# Patient Record
Sex: Female | Born: 1935
Health system: Southern US, Community
[De-identification: ages and names within clinical notes are randomized; demographics above are authoritative.]

## PROBLEM LIST (undated history)

## (undated) DIAGNOSIS — S42309A Unspecified fracture of shaft of humerus, unspecified arm, initial encounter for closed fracture: Secondary | ICD-10-CM

## (undated) DIAGNOSIS — I442 Atrioventricular block, complete: Secondary | ICD-10-CM

## (undated) DIAGNOSIS — I10 Essential (primary) hypertension: Secondary | ICD-10-CM

## (undated) DIAGNOSIS — S8290XA Unspecified fracture of unspecified lower leg, initial encounter for closed fracture: Secondary | ICD-10-CM

## (undated) DIAGNOSIS — F419 Anxiety disorder, unspecified: Secondary | ICD-10-CM

## (undated) DIAGNOSIS — R42 Dizziness and giddiness: Secondary | ICD-10-CM

## (undated) HISTORY — DX: Unspecified fracture of unspecified lower leg, initial encounter for closed fracture: S82.90XA

## (undated) HISTORY — DX: Atrioventricular block, complete: I44.2

## (undated) HISTORY — DX: Anxiety disorder, unspecified: F41.9

## (undated) HISTORY — DX: Dizziness and giddiness: R42

---

## 2013-06-10 ENCOUNTER — Encounter (HOSPITAL_COMMUNITY): Payer: Self-pay | Admitting: Emergency Medicine

## 2013-06-10 ENCOUNTER — Emergency Department (HOSPITAL_COMMUNITY)
Admission: EM | Admit: 2013-06-10 | Discharge: 2013-06-10 | Disposition: A | Discharge status: Home / Self Care | Payer: Medicare Other | Attending: Emergency Medicine | Admitting: Emergency Medicine

## 2013-06-10 ENCOUNTER — Emergency Department (HOSPITAL_COMMUNITY): Payer: Medicare Other

## 2013-06-10 DIAGNOSIS — Z880 Allergy status to penicillin: Secondary | ICD-10-CM | POA: Insufficient documentation

## 2013-06-10 DIAGNOSIS — W19XXXA Unspecified fall, initial encounter: Secondary | ICD-10-CM

## 2013-06-10 DIAGNOSIS — S93409A Sprain of unspecified ligament of unspecified ankle, initial encounter: Secondary | ICD-10-CM | POA: Insufficient documentation

## 2013-06-10 DIAGNOSIS — S39012A Strain of muscle, fascia and tendon of lower back, initial encounter: Secondary | ICD-10-CM

## 2013-06-10 DIAGNOSIS — S335XXA Sprain of ligaments of lumbar spine, initial encounter: Secondary | ICD-10-CM | POA: Insufficient documentation

## 2013-06-10 DIAGNOSIS — W010XXA Fall on same level from slipping, tripping and stumbling without subsequent striking against object, initial encounter: Secondary | ICD-10-CM | POA: Insufficient documentation

## 2013-06-10 DIAGNOSIS — Y9389 Activity, other specified: Secondary | ICD-10-CM | POA: Insufficient documentation

## 2013-06-10 DIAGNOSIS — Y9229 Other specified public building as the place of occurrence of the external cause: Secondary | ICD-10-CM | POA: Insufficient documentation

## 2013-06-10 DIAGNOSIS — S93401A Sprain of unspecified ligament of right ankle, initial encounter: Secondary | ICD-10-CM

## 2013-06-10 DIAGNOSIS — Z87891 Personal history of nicotine dependence: Secondary | ICD-10-CM | POA: Insufficient documentation

## 2013-06-10 MED ORDER — ACETAMINOPHEN 325 MG PO TABS
650.0000 mg | ORAL_TABLET | Freq: Once | ORAL | Status: AC
  Administered 2013-06-10: 650 mg via ORAL
  Filled 2013-06-10: qty 2

## 2013-06-10 NOTE — ED Provider Notes (Signed)
CSN: [id number]     Arrival date & time 06/10/13  1237 History  This chart was scribed for non-physician practitioner, Jaynie Crumble, PA-C,working with Celene Kras, MD, by Karle Plumber, ED Scribe.  This patient was seen in room WA03/WA03 and the patient's care was started at 1:12 PM.    Chief Complaint  Patient presents with  . Fall  . Back Pain  . Ankle Pain   The history is provided by the patient. No language interpreter was used.   HPI Comments:  [name] [name] is a 77 y.o. female who presents to the Emergency Department complaining of right ankle pain and lower back pain after tripping and falling here at the hospital while visiting husband onset approximately 10 minutes PTA. She states she fell to the side and complains of associated right hand pain due to trying to catch herself. Pt denies current pain in her ankle. She is very dismissive of any pain. Pt denies dizziness, LOC, or head injury.  History reviewed. No pertinent past medical history. History reviewed. No pertinent past surgical history. No family history on file. History  Substance Use Topics  . Smoking status: Former Smoker    Quit date: 09/06/1986  . Smokeless tobacco: Not on file  . Alcohol Use: Yes     Comment: occasionally   OB History   Grav Para Term Preterm Abortions TAB SAB Ect Mult Living                 Review of Systems  Musculoskeletal: Positive for back pain and arthralgias (right ankle pain).  All other systems reviewed and are negative.   Allergies  Penicillins  Home Medications  No current outpatient prescriptions on file. Triage Vitals: BP 176/71  Pulse 81  Temp(Src) 98 F (36.7 C) (Oral)  Resp 16  SpO2 96% Physical Exam  Nursing note and vitals reviewed. Constitutional: She is oriented to person, place, and time. She appears well-developed and well-nourished. No distress.  HENT:  Head: Normocephalic and atraumatic.  Eyes: Conjunctivae are normal. No scleral icterus.   Neck: Normal range of motion. Neck supple.  Cardiovascular: Normal rate and intact distal pulses.   Pulmonary/Chest: Effort normal. No stridor. No respiratory distress.  Abdominal: Normal appearance. She exhibits no distension.  Musculoskeletal:  Swelling to right lateral malleolus. Pain with plantar and dorsal flexion of the right ankle and with internal rotation. Normal foot. Dorsal pedal pulses intact. Spasms to left para-lumbar. No midline lumbar spine tenderness.   Neurological: She is alert and oriented to person, place, and time.  Skin: Skin is warm and dry. No rash noted.  Psychiatric: She has a normal mood and affect. Her behavior is normal.    ED Course  Procedures (including critical care time) DIAGNOSTIC STUDIES: Oxygen Saturation is 96% on RA, adequate by my interpretation.   COORDINATION OF CARE: 1:20 PM- Will obtain X-Rays of right ankle and give Tylenol for pain. Pt verbalizes understanding and agrees to plan.  Medications - No data to display  Labs Review Labs Reviewed - No data to display  Imaging Review Dg Lumbar Spine Complete  06/10/2013   CLINICAL DATA:  Fall with low back pain.  EXAM: LUMBAR SPINE - COMPLETE 4+ VIEW  COMPARISON:  None.  FINDINGS: Bones are osteopenic. No acute fractures identified. Mild degenerative changes are identified consisting primarily of facet hypertrophy at the L4-5 and L5-S1 levels. No bony lesions are identified.  IMPRESSION: No acute fracture.  Mild degenerative changes of the lumbar spine.  Electronically Signed   By: Irish Lack M.D.   On: 06/10/2013 13:45   Dg Ankle Complete Right  06/10/2013   CLINICAL DATA:  Fall with right ankle injury and pain.  EXAM: RIGHT ANKLE - COMPLETE 3+ VIEW  COMPARISON:  None.  FINDINGS: Soft tissue swelling is present without evidence of acute fracture or dislocation. Degenerative changes are seen involving the medial malleolus. No bony lesions or destruction are identified.  IMPRESSION: Soft  tissue swelling without evidence of acute fracture.   Electronically Signed   By: Irish Lack M.D.   On: 06/10/2013 13:48    MDM   1. Ankle sprain, right, initial encounter   2. Fall, initial encounter   3. Lumbar strain, initial encounter     PT with mechanical fall. Injury to right ankle and back. Pt is ambulatory, no distress. No head injury. She is ambulatory. X-rays of ankle and lumbar spine obtained and are negative. ASO provided. Home with close follow up. Tylenol for pain.   Filed Vitals:   06/10/13 1252 06/10/13 1303  BP: 176/71 187/63  Pulse: 81   Temp: 98 F (36.7 C)   TempSrc: Oral   Resp: 16   SpO2: 96%     I personally performed the services described in this documentation, which was scribed in my presence. The recorded information has been reviewed and is accurate.   Lottie Mussel, PA-C 06/10/13 1908

## 2013-06-10 NOTE — ED Notes (Signed)
Pt reports that she tripped and fell this approx 10 mins PTA while upstairs visiting her husband. Pt denies hitting her head. Pt c/o lower back pain and R ankle pain. Pt is A&O and in NAD.

## 2013-06-12 NOTE — ED Provider Notes (Signed)
Medical screening examination/treatment/procedure(s) were conducted as a shared visit with non-physician practitioner(s) and myself.  I personally evaluated the patient during the encounter  Pt with mild complaints.  Negative xrays.  DC with outpatient follow up as needed.   Ankle splint for support  Celene Kras, MD 06/12/13 [phone]

## 2014-04-22 ENCOUNTER — Other Ambulatory Visit (HOSPITAL_COMMUNITY): Payer: Self-pay | Admitting: Orthopaedic Surgery

## 2014-04-22 ENCOUNTER — Ambulatory Visit (HOSPITAL_COMMUNITY)
Admission: RE | Admit: 2014-04-22 | Discharge: 2014-04-22 | Disposition: A | Discharge status: Home / Self Care | Payer: Medicare Other | Source: Ambulatory Visit | Attending: Orthopaedic Surgery | Admitting: Orthopaedic Surgery

## 2014-04-22 ENCOUNTER — Other Ambulatory Visit: Payer: Self-pay | Admitting: Orthopaedic Surgery

## 2014-04-22 DIAGNOSIS — W19XXXA Unspecified fall, initial encounter: Secondary | ICD-10-CM | POA: Diagnosis not present

## 2014-04-22 DIAGNOSIS — S42209A Unspecified fracture of upper end of unspecified humerus, initial encounter for closed fracture: Secondary | ICD-10-CM | POA: Diagnosis present

## 2014-04-22 DIAGNOSIS — T148XXA Other injury of unspecified body region, initial encounter: Secondary | ICD-10-CM

## 2014-04-22 DIAGNOSIS — E041 Nontoxic single thyroid nodule: Secondary | ICD-10-CM | POA: Insufficient documentation

## 2014-04-22 DIAGNOSIS — M25511 Pain in right shoulder: Secondary | ICD-10-CM

## 2014-09-02 ENCOUNTER — Emergency Department (HOSPITAL_COMMUNITY): Payer: PRIVATE HEALTH INSURANCE

## 2014-09-02 ENCOUNTER — Emergency Department (HOSPITAL_COMMUNITY)
Admission: EM | Admit: 2014-09-02 | Discharge: 2014-09-02 | Disposition: A | Discharge status: Home / Self Care | Payer: PRIVATE HEALTH INSURANCE | Attending: Emergency Medicine | Admitting: Emergency Medicine

## 2014-09-02 ENCOUNTER — Encounter (HOSPITAL_COMMUNITY): Payer: Self-pay | Admitting: *Deleted

## 2014-09-02 DIAGNOSIS — Z87891 Personal history of nicotine dependence: Secondary | ICD-10-CM | POA: Diagnosis not present

## 2014-09-02 DIAGNOSIS — R42 Dizziness and giddiness: Secondary | ICD-10-CM | POA: Diagnosis present

## 2014-09-02 DIAGNOSIS — Z7982 Long term (current) use of aspirin: Secondary | ICD-10-CM | POA: Insufficient documentation

## 2014-09-02 DIAGNOSIS — Z880 Allergy status to penicillin: Secondary | ICD-10-CM | POA: Diagnosis not present

## 2014-09-02 DIAGNOSIS — Z792 Long term (current) use of antibiotics: Secondary | ICD-10-CM | POA: Insufficient documentation

## 2014-09-02 DIAGNOSIS — I10 Essential (primary) hypertension: Secondary | ICD-10-CM | POA: Diagnosis not present

## 2014-09-02 DIAGNOSIS — Z79899 Other long term (current) drug therapy: Secondary | ICD-10-CM | POA: Insufficient documentation

## 2014-09-02 HISTORY — DX: Essential (primary) hypertension: I10

## 2014-09-02 LAB — BASIC METABOLIC PANEL
Anion gap: 9 (ref 5–15)
BUN: 11 mg/dL (ref 6–23)
CHLORIDE: 105 meq/L (ref 96–112)
CO2: 23 mmol/L (ref 19–32)
Calcium: 9.7 mg/dL (ref 8.4–10.5)
Creatinine, Ser: 0.66 mg/dL (ref 0.50–1.10)
GFR calc Af Amer: >90 mL/min (ref >90)
GFR calc non Af Amer: 84 mL/min — ABNORMAL LOW (ref >90)
GLUCOSE: 103 mg/dL — AB (ref 70–99)
POTASSIUM: 3.7 mmol/L (ref 3.5–5.1)
Sodium: 137 mmol/L (ref 135–145)

## 2014-09-02 LAB — URINALYSIS, ROUTINE W REFLEX MICROSCOPIC
BILIRUBIN URINE: NEGATIVE
GLUCOSE, UA: NEGATIVE mg/dL
Hgb urine dipstick: NEGATIVE
KETONES UR: NEGATIVE mg/dL
Nitrite: NEGATIVE
Protein, ur: NEGATIVE mg/dL
SPECIFIC GRAVITY, URINE: 1.004 — AB (ref 1.005–1.030)
Urobilinogen, UA: 0.2 mg/dL (ref 0.0–1.0)
pH: 6.5 (ref 5.0–8.0)

## 2014-09-02 LAB — CBC
HCT: 46 % (ref 36.0–46.0)
HEMOGLOBIN: 15 g/dL (ref 12.0–15.0)
MCH: 29.3 pg (ref 26.0–34.0)
MCHC: 32.6 g/dL (ref 30.0–36.0)
MCV: 89.8 fL (ref 78.0–100.0)
Platelets: 272 10*3/uL (ref 150–400)
RBC: 5.12 MIL/uL — AB (ref 3.87–5.11)
RDW: 14.6 % (ref 11.5–15.5)
WBC: 6.7 10*3/uL (ref 4.0–10.5)

## 2014-09-02 LAB — URINE MICROSCOPIC-ADD ON

## 2014-09-02 MED ORDER — AMLODIPINE BESYLATE 2.5 MG PO TABS: 2.5000 mg | ORAL_TABLET | Freq: Every day | ORAL | Status: DC

## 2014-09-02 NOTE — ED Provider Notes (Signed)
CSN: [id number]     Arrival date & time 09/02/14  1219 History   First MD Initiated Contact with Patient 09/02/14 1501     Chief Complaint  Patient presents with  . Hypertension  . Dizziness     (Consider location/radiation/quality/duration/timing/severity/associated sxs/prior Treatment) Patient is a 78 y.o. female presenting with hypertension and dizziness. The history is provided by the patient.  Hypertension This is a new problem. The current episode started more than 1 week ago. The problem occurs constantly. The problem has not changed since onset.Pertinent negatives include no abdominal pain and no shortness of breath. Nothing aggravates the symptoms. Nothing relieves the symptoms. She has tried nothing for the symptoms.  Dizziness Associated symptoms: no shortness of breath and no vomiting     Past Medical History  Diagnosis Date  . Hypertension    History reviewed. No pertinent past surgical history. History reviewed. No pertinent family history. History  Substance Use Topics  . Smoking status: Former Smoker    Quit date: 09/06/1986  . Smokeless tobacco: Not on file  . Alcohol Use: Yes     Comment: occasionally   OB History    No data available     Review of Systems  Constitutional: Negative for fever.  Respiratory: Negative for cough and shortness of breath.   Gastrointestinal: Negative for vomiting and abdominal pain.  Neurological: Positive for dizziness.  All other systems reviewed and are negative.     Allergies  Hctz; Clindamycin/lincomycin; and Penicillins  Home Medications   Prior to Admission medications   Medication Sig Start Date End Date Taking? Authorizing Provider  aspirin 325 MG tablet Take 650 mg by mouth every 4 (four) hours as needed for moderate pain or headache.    Yes Historical Provider, MD  cephALEXin (KEFLEX) 500 MG capsule Take 500 mg by mouth 3 (three) times daily. For 14 days 08/26/14  Yes Historical Provider, MD   Cyanocobalamin (VITAMIN B-12 PO) Take 1 tablet by mouth daily.   Yes Historical Provider, MD  magnesium oxide (MAG-OX) 400 MG tablet Take 400 mg by mouth daily.   Yes Historical Provider, MD  Multiple Vitamins-Minerals (ICAPS MV PO) Take 1 tablet by mouth daily.   Yes Historical Provider, MD   BP 189/74 mmHg  Pulse 78  Temp(Src) 98.2 F (36.8 C) (Oral)  Resp 18  Ht 5' 6.5" (1.689 m)  Wt 197 lb (89.359 kg)  BMI 31.32 kg/m2  SpO2 99% Physical Exam  Constitutional: She is oriented to person, place, and time. She appears well-developed and well-nourished. No distress.  HENT:  Head: Normocephalic and atraumatic.  Mouth/Throat: Oropharynx is clear and moist.  Eyes: EOM are normal. Pupils are equal, round, and reactive to light.  Neck: Normal range of motion. Neck supple.  Cardiovascular: Normal rate and regular rhythm.  Exam reveals no friction rub.   No murmur heard. Pulmonary/Chest: Effort normal and breath sounds normal. No respiratory distress. She has no wheezes. She has no rales.  Abdominal: Soft. She exhibits no distension. There is no tenderness. There is no rebound.  Musculoskeletal: Normal range of motion. She exhibits no edema.  Neurological: She is alert and oriented to person, place, and time. No cranial nerve deficit or sensory deficit. She exhibits normal muscle tone. Coordination and gait normal. GCS eye subscore is 4. GCS verbal subscore is 5. GCS motor subscore is 6.  Negative Romberg  Skin: She is not diaphoretic.  Nursing note and vitals reviewed.   ED Course  Procedures (including  critical care time) Labs Review Labs Reviewed  BASIC METABOLIC PANEL - Abnormal; Notable for the following:    Glucose, Bld 103 (*)    GFR calc non Af Amer 84 (*)    All other components within normal limits  CBC - Abnormal; Notable for the following:    RBC 5.12 (*)    All other components within normal limits  URINALYSIS, ROUTINE W REFLEX MICROSCOPIC - Abnormal; Notable for the  following:    Specific Gravity, Urine 1.004 (*)    Leukocytes, UA TRACE (*)    All other components within normal limits  URINE MICROSCOPIC-ADD ON    Imaging Review Mr Brain Wo Contrast  09/02/2014   CLINICAL DATA:  Dizziness.  Hypertension  EXAM: MRI HEAD WITHOUT CONTRAST  TECHNIQUE: Multiplanar, multiecho pulse sequences of the brain and surrounding structures were obtained without intravenous contrast.  COMPARISON:  None.  FINDINGS: Age-appropriate atrophy.  Negative for hydrocephalus.  Negative for acute infarct  Small white matter hyperintensities bilaterally consistent with microvascular chronic ischemia. Mild chronic changes in the pons. Cerebellum intact.  Negative for hemorrhage  Negative for mass or edema.  Paranasal sinuses are clear.  IMPRESSION: No acute abnormality.  Normal for age.   Electronically Signed   By: Franchot Gallo M.D.   On: 09/02/2014 17:29     EKG Interpretation   Date/Time:  Monday September 02 2014 17:22:14 EST Ventricular Rate:  72 PR Interval:  179 QRS Duration: 138 QT Interval:  434 QTC Calculation: 475 R Axis:   61 Text Interpretation:  Sinus rhythm Right bundle branch block No prior for  comparison Confirmed by Mingo Amber  MD, Breyer Tejera ([id number]) on 09/02/2014 5:53:19 PM      MDM   Final diagnoses:  Dizziness  Essential hypertension    36F here with HTN. Taken at physical therapy, increasing on multiple rechecks this morning. Hx of a few months of dizziness, strange feelings in her head (described as "heavy-headed-ness"). Was diagnosed with sinus infection and is on keflex at this time.  Does not have a PCP, has been trying to establish one for BP control as she was noted to be hypertensive at the [facility]. Is not currently on any anti-hypertensives. Patient has denied dizziness at all times, no spinning sensation. She does feel "unsafe on her feet," described as feeling wobbly.  Here hypertensive. No extremity weakness, normal  sensation. Normal cranial nerve sensations. Ambulated well, negative romberg, however she stated she felt wobbly and unsafe when she stood up. Will MRI to look for stroke with her sudden new hypertension. MR ok. Patient is stable for discharge. Given amlodipine, low dose, to begin treating her HTN, given resource guide to help establish PCP f/u.   Evelina Bucy, MD 09/03/14 [phone]

## 2014-09-02 NOTE — ED Notes (Addendum)
Per ems pt is from doctors office, pt was there for physical therapy for previously broken arm, this was follow up visit. Pt reported to staff she was not feeling well. Recently dx with HTN, but not prescribed medications. Recently deal with sinus infection, pt has head pressure. Recently on zithromycin and keflex. Pt sent to ED for HTN. Pt denies chest pain, dizziness, or SOB. Denies pain.  Reports upon arrival to ED felt dizzy. Pt asking for help with ambulation due to dizziness. Pt able to ambulate independently.

## 2014-09-02 NOTE — ED Notes (Signed)
Pt returned from MRI °

## 2014-09-02 NOTE — ED Notes (Signed)
Patient transported to MRI 

## 2014-09-02 NOTE — Discharge Instructions (Signed)
Hypertension °Hypertension, commonly called high blood pressure, is when the force of blood pumping through your arteries is too strong. Your arteries are the blood vessels that carry blood from your heart throughout your body. A blood pressure reading consists of a higher number over a lower number, such as 110/72. The higher number (systolic) is the pressure inside your arteries when your heart pumps. The lower number (diastolic) is the pressure inside your arteries when your heart relaxes. Ideally you want your blood pressure below 120/80. °Hypertension forces your heart to work harder to pump blood. Your arteries may become narrow or stiff. Having hypertension puts you at risk for heart disease, stroke, and other problems.  °RISK FACTORS °Some risk factors for high blood pressure are controllable. Others are not.  °Risk factors you cannot control include:  °· Race. You may be at higher risk if you are African American. °· Age. Risk increases with age. °· Gender. Men are at higher risk than women before age 45 years. After age 65, women are at higher risk than men. °Risk factors you can control include: °· Not getting enough exercise or physical activity. °· Being overweight. °· Getting too much fat, sugar, calories, or salt in your diet. °· Drinking too much alcohol. °SIGNS AND SYMPTOMS °Hypertension does not usually cause signs or symptoms. Extremely high blood pressure (hypertensive crisis) may cause headache, anxiety, shortness of breath, and nosebleed. °DIAGNOSIS  °To check if you have hypertension, your health care provider will measure your blood pressure while you are seated, with your arm held at the level of your heart. It should be measured at least twice using the same arm. Certain conditions can cause a difference in blood pressure between your right and left arms. A blood pressure reading that is higher than normal on one occasion does not mean that you need treatment. If one blood pressure reading  is high, ask your health care provider about having it checked again. °TREATMENT  °Treating high blood pressure includes making lifestyle changes and possibly taking medicine. Living a healthy lifestyle can help lower high blood pressure. You may need to change some of your habits. °Lifestyle changes may include: °· Following the DASH diet. This diet is high in fruits, vegetables, and whole grains. It is low in salt, red meat, and added sugars. °· Getting at least 2½ hours of brisk physical activity every week. °· Losing weight if necessary. °· Not smoking. °· Limiting alcoholic beverages. °· Learning ways to reduce stress. ° If lifestyle changes are not enough to get your blood pressure under control, your health care provider may prescribe medicine. You may need to take more than one. Work closely with your health care provider to understand the risks and benefits. °HOME CARE INSTRUCTIONS °· Have your blood pressure rechecked as directed by your health care provider.   °· Take medicines only as directed by your health care provider. Follow the directions carefully. Blood pressure medicines must be taken as prescribed. The medicine does not work as well when you skip doses. Skipping doses also puts you at risk for problems.   °· Do not smoke.   °· Monitor your blood pressure at home as directed by your health care provider.  °SEEK MEDICAL CARE IF:  °· You think you are having a reaction to medicines taken. °· You have recurrent headaches or feel dizzy. °· You have swelling in your ankles. °· You have trouble with your vision. °SEEK IMMEDIATE MEDICAL CARE IF: °· You develop a severe headache or confusion. °·   You have unusual weakness, numbness, or feel faint.  You have severe chest or abdominal pain.  You vomit repeatedly.  You have trouble breathing. MAKE SURE YOU:   Understand these instructions.  Will watch your condition.  Will get help right away if you are not doing well or get worse. Document  Released: 08/23/2005 Document Revised: 01/07/2014 Document Reviewed: 06/15/2013 [facility] Patient Information 2015 [place], [place]. This information is not intended to replace advice given to you by your health care provider. Make sure you discuss any questions you have with your health care provider.   Emergency Department Resource Guide 1) Find a Doctor and Pay Out of Pocket Although you won't have to find out who is covered by your insurance plan, it is a good idea to ask around and get recommendations. You will then need to call the office and see if the doctor you have chosen will accept you as a new patient and what types of options they offer for patients who are self-pay. Some doctors offer discounts or will set up payment plans for their patients who do not have insurance, but you will need to ask so you aren't surprised when you get to your appointment.  2) Contact Your Local Health Department Not all health departments have doctors that can see patients for sick visits, but many do, so it is worth a call to see if yours does. If you don't know where your local health department is, you can check in your phone book. The CDC also has a tool to help you locate your state's health department, and many state websites also have listings of all of their local health departments.  3) Find a [facility] If your illness is not likely to be very severe or complicated, you may want to try a walk in clinic. These are popping up all over the country in pharmacies, drugstores, and shopping centers. They're usually staffed by nurse practitioners or physician assistants that have been trained to treat common illnesses and complaints. They're usually fairly quick and inexpensive. However, if you have serious medical issues or chronic medical problems, these are probably not your best option.  No Primary Care Doctor: - Call Health Connect at  [phone] - they can help you locate a primary care doctor that   accepts your insurance, provides certain services, etc. - Physician Referral Service- [phone]  Chronic Pain Problems: Organization         Address  Phone   Notes  [facility]  [phone] Patients need to be referred by their primary care doctor.   Medication Assistance: Organization         Address  Phone   Notes  [facility] Medication [facility] [address] [place], [place] [address] [phone] --Must be a resident of [facility] -- Must have NO insurance coverage whatsoever (no Medicaid/ Medicare, etc.) -- The pt. MUST have a primary care doctor that directs their care regularly and follows them in the community   MedAssist  [phone]   [employer]  [phone]    Agencies that provide inexpensive medical care: Organization         Address  Phone   Notes  [place]  [phone]   [facility]    [phone]   [facility] [place], [place] [address] [phone]   [place] [address]  [place] [phone]   Planned Parenthood    [phone]   [facility]    [phone]   Community Health and [place] [address], [place] Phone:  [phone], Fax:  [phone] Hours of Operation:  9 am - 6 pm, M-F.  Also accepts Medicaid/Medicare and self-pay.  [facility] for [address], [address], [place] Phone: [phone], Fax: [phone]. Hours of Operation:  8:30 am - 5:30 pm, M-F.  Also accepts Medicaid and self-pay.  [facility] [place] [address] [place] Phone: [phone]   [place], [place], [place] [phone], Ext. [phone] Mondays & Thursdays: 7-9 AM.  First 15 patients are seen on a first come, first serve basis.    [place] Providers:  Organization          Address  Phone   Notes  [facility] [address], [place] [phone] Also accepts self-pay patients.  [facility] [address], [place]  [phone]   [place], [address], [place] [phone]   [facility] [address] [place] [phone]   Lucianne Lei [address], [place]   [phone] Only accepts [place] Access [place] patients after they have their name applied to their card.   Self-Pay (no insurance) in [facility]:  Organization         Address  Phone   Notes  Sickle Cell Patients, [facility] Internal Medicine [place] [phone]   [facility] Urgent Care [place] [phone]   [facility] [place]  [place], [place], [place] [phone]   [facility]/Dr. Osei-Bonsu  [address] [place] or [address], [place] [phone] Phone number for both [place] and [place] locations is the same.  Urgent Medical and [facility] [address] [place] [phone]   [facility] [address] [place] or [address] [phone]   [facility] [address] [place] [phone], phone; [phone], fax Sees patients 1st and 3rd Saturday of every month.  Must not qualify for public or private insurance (i.e. Medicaid, Medicare, [place] Health Choice, Veterans' Benefits)  Household income should be no more than 200% of the poverty level The clinic cannot treat you if you are pregnant or think you are pregnant  Sexually transmitted diseases are not treated at the clinic.    Dental Care: Organization         Address  Phone  Notes  [facility] Department of [facility] [place] [phone] Accepts children up to age 63 who are enrolled in [place] or [place]; pregnant women with a Medicaid card; and children who have applied for Medicaid or [place] Health Choice, but were declined, whose parents can pay a reduced fee at time of service.  [facility] Department of [facility]  [address], [place] [phone] Accepts children up to age 87 who are enrolled in [place] or [place]; pregnant women with a Medicaid card; and children who have applied for Medicaid or [place]  Health Choice, but were declined, whose parents can pay a reduced fee at time of service.  [place] Adult Dental Access PROGRAM  [place] [phone] Patients are seen by appointment only. Walk-ins are not accepted. [place] will see patients 72 years of age and older. Monday - Tuesday (8am-5pm) Most Wednesdays (8:30-5pm) $30 per visit, cash only  [facility] Adult Dental Access PROGRAM  [address], [facility] [phone] Patients are seen by appointment only. Walk-ins are not accepted. [place] will see patients 43 years of age and older. One Wednesday Evening (Monthly: Volunteer Based).  $30 per visit, cash only  [place]  [phone] for adults; Children under age 60, call Graduate Pediatric Dentistry at [phone]. Children aged 54-14, please call [phone] to request a pediatric application.  Dental services are provided in all areas of dental care including fillings, crowns and bridges, complete and partial dentures, implants, gum treatment, root canals, and extractions. Preventive care is also provided. Treatment is provided to both adults and children. Patients are selected via a lottery and there is often a waiting list.   [facility] [address] [place]  [phone] [email or web address]   [facility] [address] [place], [place]  [phone], Ext. [phone] Second and Fourth Thursday of each month, opens at 6:30 AM; Clinic ends at 9 AM.  Patients are seen on a first-come first-served basis, and a limited number are seen during each clinic.   [facility]  [address] [place], [place] [phone]   Eligibility Requirements You must have lived in [place], [place], or [place] counties for at least the last three months.   You cannot be eligible for state or federal sponsored [employer], including [employer], [place], or [employer].   You generally cannot be eligible for healthcare insurance through your employer.    How to apply: Eligibility screenings are held every Tuesday and Wednesday afternoon from 1:00 pm until 4:00 pm. You do not need an appointment for the interview!  [facility] [address] [place], [place]   [place]  [place] Department  [place]  [phone]    Behavioral Health Resources in the Community: Intensive Outpatient Programs Organization         Address  Phone  Notes  [place]. [address] [place], [place] [phone]   [facility]patient [address], [place], [place]   ADS: Alcohol & Drug Svcs [address], [place], [place]   [place] [address],  [place], [place] or [phone]   Substance Abuse Resources Organization         Address  Phone  Notes  Alcohol and Drug Services  [phone]   [place]  [phone]   The [place]   Chinita Pester  [phone]   Residential & Outpatient Substance Abuse Program  [phone]   Psychological Services Organization         Address  Phone  Notes  [facility] [place]  [place]  [phone]    [place] [address], [place] or [phone]    Mobile Crisis Teams Organization         Address  Phone  Notes  [facility]rapeutic Alternatives, Mobile Crisis Care Unit  [phone]   [facility]ive Psychotherapeutic Services  [address] [place], [place]   [facility] [address], [place] [phone]    Self-Help/Support Groups Organization         Address  Phone             Notes  [facility] [place] - variety of support groups  [place] Call for more information  Narcotics Anonymous (NA), [facility] [address], [employer] [place]  2 meetings at this location   Special educational needs teacher         Address  Phone  Notes  ASAP Residential Treatment [place],    [place]  [phone]   [facility]  [address] [place] [address]27078, [place], [place]   [place], [place] [phone] Admissions: 8am-3pm M-F  Incentives Substance [place] [address] [place] [address],    [place], [place] [phone]   [facility] [address] [place], [place], [place]   [facility] [address],  [place], [place]   [facility]ight Programs - Intensive Outpatient [address]een [address], [place], [place]   [facility] ([place].) [place].,  [place], [place] [phone] or [phone]   [facility] Services (RTS) [address] [place], [place] Accepts Medicaid  [facility] [place] [address],  [place] 1-8625027997 Substance Abuse/Addiction Treatment   [facility] Organization         Address  Phone  Notes  [facility]  [phone]   Domenic Schwab, PhD [address] [place], [place]   [phone] or [phone]   [place]  [place], [place] [phone]   [facility] [address], [place], [place] [phone] Insurance/Medicaid/sponsorship through [facility] and Families [address], [address] [place]                                    [place], [place] [phone] [place] [address], [place] [phone]    Dr. Adele Schilder  [phone]   [facility] Dept. 1) [address] [place] 2) [place] 3)  [place], [place] [phone]  [phone]   [place] [phone] or [phone] (After Hours)

## 2014-09-02 NOTE — Progress Notes (Signed)
  CARE MANAGEMENT ED NOTE 09/02/2014  Patient:  [name],[name]   Account Number:  [id number]  Date Initiated:  09/02/2014  Documentation initiated by:  Livia Snellen  Subjective/Objective Assessment:   Patient presentsto Ed with hypertension, dizziness     Subjective/Objective Assessment Detail:   Patient without history of hypertension     Action/Plan:   Action/Plan Detail:   Anticipated DC Date:  09/02/2014     Status Recommendation to Physician:   Result of Recommendation:    Other ED [place]  Other  PCP issues    Choice offered to / List presented to:            Status of service:  Completed, signed off  ED Comments:   ED Comments Detail:  EDCm spoke to patient at bedside.  Patient confirms she does not have a pcp.  Patient reports she is in the process of becoming a patient of Dr. Mertha Finders of [facility].  Patient reports her first appointment with Dr. Inda Merlin is on Feb 5th.  Patient eeports she has been goig to the Urgent Care on [place] and also the after hours clinic at [facility] on [place].  No further EDCM needs at this time.

## 2014-09-03 ENCOUNTER — Encounter (HOSPITAL_BASED_OUTPATIENT_CLINIC_OR_DEPARTMENT_OTHER): Payer: Self-pay | Admitting: Emergency Medicine

## 2014-09-03 ENCOUNTER — Telehealth (HOSPITAL_BASED_OUTPATIENT_CLINIC_OR_DEPARTMENT_OTHER): Payer: Self-pay | Admitting: Emergency Medicine

## 2015-01-05 HISTORY — PX: PACEMAKER INSERTION: SHX728

## 2015-01-14 ENCOUNTER — Encounter (HOSPITAL_COMMUNITY): Payer: Self-pay | Admitting: Emergency Medicine

## 2015-01-14 ENCOUNTER — Inpatient Hospital Stay (HOSPITAL_COMMUNITY)
Admission: EM | Admit: 2015-01-14 | Discharge: 2015-01-18 | DRG: 244 | Disposition: A | Discharge status: Home / Self Care | Payer: Medicare Other | Attending: Internal Medicine | Admitting: Internal Medicine

## 2015-01-14 ENCOUNTER — Emergency Department (HOSPITAL_COMMUNITY): Payer: Medicare Other

## 2015-01-14 DIAGNOSIS — R55 Syncope and collapse: Secondary | ICD-10-CM | POA: Diagnosis not present

## 2015-01-14 DIAGNOSIS — I2584 Coronary atherosclerosis due to calcified coronary lesion: Secondary | ICD-10-CM | POA: Diagnosis present

## 2015-01-14 DIAGNOSIS — I4519 Other right bundle-branch block: Secondary | ICD-10-CM | POA: Diagnosis present

## 2015-01-14 DIAGNOSIS — I10 Essential (primary) hypertension: Secondary | ICD-10-CM | POA: Diagnosis present

## 2015-01-14 DIAGNOSIS — Z87891 Personal history of nicotine dependence: Secondary | ICD-10-CM

## 2015-01-14 DIAGNOSIS — Z8249 Family history of ischemic heart disease and other diseases of the circulatory system: Secondary | ICD-10-CM

## 2015-01-14 DIAGNOSIS — Z7982 Long term (current) use of aspirin: Secondary | ICD-10-CM

## 2015-01-14 DIAGNOSIS — M7989 Other specified soft tissue disorders: Secondary | ICD-10-CM | POA: Diagnosis present

## 2015-01-14 DIAGNOSIS — Z7951 Long term (current) use of inhaled steroids: Secondary | ICD-10-CM

## 2015-01-14 DIAGNOSIS — I441 Atrioventricular block, second degree: Principal | ICD-10-CM | POA: Insufficient documentation

## 2015-01-14 DIAGNOSIS — R0600 Dyspnea, unspecified: Secondary | ICD-10-CM | POA: Diagnosis not present

## 2015-01-14 DIAGNOSIS — Z880 Allergy status to penicillin: Secondary | ICD-10-CM

## 2015-01-14 DIAGNOSIS — R0609 Other forms of dyspnea: Secondary | ICD-10-CM | POA: Diagnosis not present

## 2015-01-14 DIAGNOSIS — M546 Pain in thoracic spine: Secondary | ICD-10-CM

## 2015-01-14 DIAGNOSIS — R531 Weakness: Secondary | ICD-10-CM | POA: Diagnosis present

## 2015-01-14 DIAGNOSIS — R634 Abnormal weight loss: Secondary | ICD-10-CM | POA: Diagnosis present

## 2015-01-14 DIAGNOSIS — I2510 Atherosclerotic heart disease of native coronary artery without angina pectoris: Secondary | ICD-10-CM | POA: Diagnosis present

## 2015-01-14 DIAGNOSIS — Z959 Presence of cardiac and vascular implant and graft, unspecified: Secondary | ICD-10-CM | POA: Insufficient documentation

## 2015-01-14 HISTORY — DX: Unspecified fracture of shaft of humerus, unspecified arm, initial encounter for closed fracture: S42.309A

## 2015-01-14 LAB — CBC WITH DIFFERENTIAL/PLATELET
BASOS ABS: 0 10*3/uL (ref 0.0–0.1)
Basophils Relative: 0 % (ref 0–1)
Eosinophils Absolute: 0.1 10*3/uL (ref 0.0–0.7)
Eosinophils Relative: 1 % (ref 0–5)
HCT: 46 % (ref 36.0–46.0)
Hemoglobin: 15.2 g/dL — ABNORMAL HIGH (ref 12.0–15.0)
LYMPHS PCT: 21 % (ref 12–46)
Lymphs Abs: 1.7 10*3/uL (ref 0.7–4.0)
MCH: 29.8 pg (ref 26.0–34.0)
MCHC: 33 g/dL (ref 30.0–36.0)
MCV: 90.2 fL (ref 78.0–100.0)
Monocytes Absolute: 0.7 10*3/uL (ref 0.1–1.0)
Monocytes Relative: 9 % (ref 3–12)
NEUTROS ABS: 5.5 10*3/uL (ref 1.7–7.7)
Neutrophils Relative %: 69 % (ref 43–77)
Platelets: 281 10*3/uL (ref 150–400)
RBC: 5.1 MIL/uL (ref 3.87–5.11)
RDW: 13.8 % (ref 11.5–15.5)
WBC: 8.1 10*3/uL (ref 4.0–10.5)

## 2015-01-14 LAB — BRAIN NATRIURETIC PEPTIDE: B Natriuretic Peptide: 17.6 pg/mL (ref 0.0–100.0)

## 2015-01-14 LAB — URINALYSIS, ROUTINE W REFLEX MICROSCOPIC
Bilirubin Urine: NEGATIVE
GLUCOSE, UA: NEGATIVE mg/dL
HGB URINE DIPSTICK: NEGATIVE
KETONES UR: NEGATIVE mg/dL
Leukocytes, UA: NEGATIVE
Nitrite: NEGATIVE
PH: 7 (ref 5.0–8.0)
Protein, ur: NEGATIVE mg/dL
Specific Gravity, Urine: <1.005 — ABNORMAL LOW (ref 1.005–1.030)
Urobilinogen, UA: 0.2 mg/dL (ref 0.0–1.0)

## 2015-01-14 LAB — I-STAT TROPONIN, ED
Troponin i, poc: 0 ng/mL (ref 0.00–0.08)
Troponin i, poc: 0 ng/mL (ref 0.00–0.08)

## 2015-01-14 LAB — I-STAT CHEM 8, ED
BUN: 17 mg/dL (ref 6–20)
Calcium, Ion: 1.2 mmol/L (ref 1.13–1.30)
Chloride: 101 mmol/L (ref 101–111)
Creatinine, Ser: 0.9 mg/dL (ref 0.44–1.00)
Glucose, Bld: 101 mg/dL — ABNORMAL HIGH (ref 70–99)
HCT: 45 % (ref 36.0–46.0)
HEMOGLOBIN: 15.3 g/dL — AB (ref 12.0–15.0)
POTASSIUM: 4 mmol/L (ref 3.5–5.1)
Sodium: 137 mmol/L (ref 135–145)
TCO2: 21 mmol/L (ref 0–100)

## 2015-01-14 LAB — TROPONIN I

## 2015-01-14 LAB — D-DIMER, QUANTITATIVE (NOT AT ARMC): D DIMER QUANT: 2.17 ug{FEU}/mL — AB (ref 0.00–0.48)

## 2015-01-14 MED ORDER — OCUVITE-LUTEIN PO CAPS
1.0000 | ORAL_CAPSULE | Freq: Every day | ORAL | Status: DC
  Administered 2015-01-15 – 2015-01-18 (×4): 1 via ORAL
  Filled 2015-01-14 (×5): qty 1

## 2015-01-14 MED ORDER — VITAMIN C 500 MG PO TABS
500.0000 mg | ORAL_TABLET | Freq: Every day | ORAL | Status: DC
  Administered 2015-01-15 – 2015-01-18 (×4): 500 mg via ORAL
  Filled 2015-01-14 (×4): qty 1

## 2015-01-14 MED ORDER — ONDANSETRON HCL 4 MG/2ML IJ SOLN: 4.0000 mg | Freq: Four times a day (QID) | INTRAMUSCULAR | Status: DC | PRN

## 2015-01-14 MED ORDER — AMLODIPINE BESYLATE 2.5 MG PO TABS
2.5000 mg | ORAL_TABLET | Freq: Every day | ORAL | Status: DC
  Administered 2015-01-15 – 2015-01-18 (×3): 2.5 mg via ORAL
  Filled 2015-01-14 (×4): qty 1

## 2015-01-14 MED ORDER — ASPIRIN 325 MG PO TABS
325.0000 mg | ORAL_TABLET | Freq: Every day | ORAL | Status: DC
  Administered 2015-01-15: 325 mg via ORAL
  Filled 2015-01-14: qty 1

## 2015-01-14 MED ORDER — ENOXAPARIN SODIUM 40 MG/0.4ML ~~LOC~~ SOLN
40.0000 mg | SUBCUTANEOUS | Status: DC
  Filled 2015-01-14 (×3): qty 0.4

## 2015-01-14 MED ORDER — ICAPS MV PO TABS: ORAL_TABLET | Freq: Every day | ORAL | Status: DC

## 2015-01-14 MED ORDER — TRIAMTERENE-HCTZ 37.5-25 MG PO TABS
1.0000 | ORAL_TABLET | Freq: Every day | ORAL | Status: DC
  Administered 2015-01-15: 1 via ORAL
  Filled 2015-01-14: qty 1

## 2015-01-14 MED ORDER — MAGNESIUM OXIDE 400 (241.3 MG) MG PO TABS
200.0000 mg | ORAL_TABLET | Freq: Every day | ORAL | Status: DC
  Administered 2015-01-15 – 2015-01-18 (×4): 200 mg via ORAL
  Filled 2015-01-14 (×3): qty 0.5
  Filled 2015-01-14: qty 1
  Filled 2015-01-14: qty 0.5

## 2015-01-14 MED ORDER — ONDANSETRON HCL 4 MG PO TABS: 4.0000 mg | ORAL_TABLET | Freq: Four times a day (QID) | ORAL | Status: DC | PRN

## 2015-01-14 MED ORDER — FLUTICASONE PROPIONATE 50 MCG/ACT NA SUSP
2.0000 | Freq: Every day | NASAL | Status: DC
  Administered 2015-01-15 – 2015-01-18 (×4): 2 via NASAL
  Filled 2015-01-14: qty 16

## 2015-01-14 MED ORDER — MAGNESIUM 250 MG PO TABS
250.0000 mg | ORAL_TABLET | Freq: Every day | ORAL | Status: DC
Start: 2015-01-14 — End: 2015-01-14

## 2015-01-14 MED ORDER — ACETAMINOPHEN 650 MG RE SUPP: 650.0000 mg | Freq: Four times a day (QID) | RECTAL | Status: DC | PRN

## 2015-01-14 MED ORDER — SODIUM CHLORIDE 0.9 % IJ SOLN
3.0000 mL | Freq: Two times a day (BID) | INTRAMUSCULAR | Status: DC
  Administered 2015-01-14 – 2015-01-15 (×2): 3 mL via INTRAVENOUS

## 2015-01-14 MED ORDER — ACETAMINOPHEN 325 MG PO TABS
650.0000 mg | ORAL_TABLET | Freq: Four times a day (QID) | ORAL | Status: DC | PRN
Start: 2015-01-14 — End: 2015-01-16
  Administered 2015-01-14 – 2015-01-16 (×2): 650 mg via ORAL
  Filled 2015-01-14 (×2): qty 2

## 2015-01-14 MED ORDER — FAMOTIDINE 10 MG PO CHEW: 10.0000 mg | CHEWABLE_TABLET | Freq: Every day | ORAL | Status: DC

## 2015-01-14 MED ORDER — IOHEXOL 350 MG/ML SOLN
75.0000 mL | Freq: Once | INTRAVENOUS | Status: AC | PRN
  Administered 2015-01-14: 75 mL via INTRAVENOUS

## 2015-01-14 MED ORDER — FAMOTIDINE 20 MG PO TABS
10.0000 mg | ORAL_TABLET | Freq: Every day | ORAL | Status: DC
  Administered 2015-01-14 – 2015-01-17 (×4): 10 mg via ORAL
  Filled 2015-01-14 (×6): qty 1

## 2015-01-14 NOTE — ED Notes (Signed)
Pt reports to weak to walk with pulse ox.

## 2015-01-14 NOTE — ED Notes (Signed)
Dr Posey Pronto still at bedside

## 2015-01-14 NOTE — ED Notes (Signed)
Patient coming from home with left sided back pain ongoing since 4am.  Associated shortness of breath with movement.  Initial BP 220/118, EMS BP 140/78, HR 84, 16 respirations, and 97%.  Patient took 325mg  Aspirin, 1 Nitro.  Pain currently 2/10.

## 2015-01-14 NOTE — ED Notes (Signed)
Dr. Patel at bedside 

## 2015-01-14 NOTE — ED Notes (Signed)
Lab results reported Chem-8 and I-Stat Tron. To Dr. Roderic Palau.

## 2015-01-14 NOTE — ED Provider Notes (Signed)
Complains of exertional dyspnea worsening for the past week. She also complains of intermittent midthoracic back pain for several weeks. She is presently asymptomatic. On exam no distress lungs clear auscultation heart regular rate and rhythm abdomen nondistended nontender extremities all edema  Orlie Dakin, MD 01/15/15 [id number]

## 2015-01-14 NOTE — ED Notes (Signed)
Ordered heart healthy tray  

## 2015-01-14 NOTE — ED Provider Notes (Signed)
CSN: [id number]     Arrival date & time 01/14/15  1346 History   First MD Initiated Contact with Patient 01/14/15 1504     Chief Complaint  Patient presents with  . Back Pain     (Consider location/radiation/quality/duration/timing/severity/associated sxs/prior Treatment) HPI Eleanore Junio is a 79 y.o. female with hx of htn, presents to ED with complaint of left sided upper back pain, shortness of breath, dizziness. Patient states her symptoms started at 4:30 this morning when she woke up to go to the bathroom. She noted she has left upper back pain that was not worse with deep breathing or movement. She states "it feels dull and constant." She states she took some aspirin for the pain and try to go back to sleep but she could not. She states she did not feel well all morning but was able to take a shower and wash her hair. She actually had an appointment with her doctor this afternoon but states she felt so bad that she did not think she could make it to that appointment. Patient called EMS. She denies any chest pain but states she has had persistent back pain and felt like she was going to pass out. She denies any vertigo-like symptoms. No headache. States shortness of breath is only on exertion. She denies any prior heart or lung problems. She states she had similar symptoms a year ago and had MRI which was negative. She states while she is taking a shower earlier she noticed she had to hold onto the wall which normally does not have to do. EMS patient's blood pressure was very high when they arrived, it was 220/118. She received 225 mg of aspirin, one nitroglycerin. She also states she doubled her blood pressure medications earlier because of blood pressure was 035 systolic this morning when she took it. Patient checks her blood pressure every single day. Patient reports similar near syncopal episodes over the past several months. She reports one episode exactly a week ago when she states she almost  passed out but felt much better treatments later and did not go see a doctor. Patient states she feels better at present but still states that she is having some pain and dizziness.=j  Past Medical History  Diagnosis Date  . Hypertension   . Broken arm Right   History reviewed. No pertinent past surgical history. No family history on file. History  Substance Use Topics  . Smoking status: Former Smoker    Quit date: 09/06/1986  . Smokeless tobacco: Not on file  . Alcohol Use: Yes     Comment: occasionally   OB History    No data available     Review of Systems  Constitutional: Negative for fever and chills.  Respiratory: Positive for shortness of breath. Negative for cough and chest tightness.   Cardiovascular: Negative for chest pain, palpitations and leg swelling.  Gastrointestinal: Positive for nausea. Negative for vomiting, abdominal pain and diarrhea.  Genitourinary: Negative for dysuria, flank pain and pelvic pain.  Musculoskeletal: Positive for back pain. Negative for myalgias, arthralgias, neck pain and neck stiffness.  Skin: Negative for rash.  Neurological: Positive for dizziness and light-headedness. Negative for weakness, numbness and headaches.  All other systems reviewed and are negative.     Allergies  Hctz; Losartan; Macrolides and ketolides; Benazepril; and Penicillins  Home Medications   Prior to Admission medications   Medication Sig Start Date End Date Taking? Authorizing Provider  amLODipine (NORVASC) 2.5 MG tablet Take 1 tablet (  2.5 mg total) by mouth daily. Patient taking differently: Take 2.5-5 mg by mouth daily.  09/02/14  Yes Evelina Bucy, MD  aspirin 325 MG tablet Take 325 mg by mouth daily.   Yes Historical Provider, MD  calcium carbonate (TUMS EX) 750 MG chewable tablet Chew 1 tablet by mouth daily.   Yes Historical Provider, MD  Cholecalciferol (VITAMIN D3) 2000 UNITS TABS Take 2,000 Units by mouth daily.   Yes Historical Provider, MD   Cyanocobalamin (VITAMIN B-12 PO) Take 1 tablet by mouth daily.   Yes Historical Provider, MD  famotidine (PEPCID AC) 10 MG chewable tablet Chew 10 mg by mouth at bedtime.   Yes Historical Provider, MD  fluticasone (FLONASE) 50 MCG/ACT nasal spray Place 2 sprays into both nostrils daily. 12/27/14  Yes Historical Provider, MD  Magnesium 250 MG TABS Take 250 mg by mouth daily.   Yes Historical Provider, MD  Multiple Vitamins-Minerals (ICAPS MV PO) Take 1 tablet by mouth daily.   Yes Historical Provider, MD  triamterene-hydrochlorothiazide (MAXZIDE-25) 37.5-25 MG per tablet Take 1 tablet by mouth daily. 12/27/14  Yes Historical Provider, MD  vitamin C (ASCORBIC ACID) 500 MG tablet Take 500 mg by mouth daily.   Yes Historical Provider, MD   BP 129/78 mmHg  Pulse 72  Temp(Src) 98 F (36.7 C) (Oral)  Resp 15  Ht 5\' 6"  (1.676 m)  Wt 178 lb (80.74 kg)  BMI 28.74 kg/m2  SpO2 97% Physical Exam  Constitutional: She is oriented to person, place, and time. She appears well-developed and well-nourished. No distress.  HENT:  Head: Normocephalic and atraumatic.  Eyes: Conjunctivae and EOM are normal. Pupils are equal, round, and reactive to light.  Neck: Neck supple.  Cardiovascular: Normal rate, regular rhythm and normal heart sounds.   Pulmonary/Chest: Effort normal and breath sounds normal. No respiratory distress. She has no wheezes. She has no rales.  Abdominal: Soft. Bowel sounds are normal. She exhibits no distension. There is no tenderness. There is no rebound.  Musculoskeletal: She exhibits no edema.   No midline cervical, thoracic, lumbar spine tenderness. No tenderness  Palpation over left periscapular area around where patient's reported pain is. Full range of motion of the left arm  At all joints  Neurological: She is alert and oriented to person, place, and time. No cranial nerve deficit. Coordination normal.  Skin: Skin is warm and dry.  Psychiatric: She has a normal mood and affect. Her  behavior is normal.  Nursing note and vitals reviewed.   ED Course  Procedures (including critical care time) Labs Review Labs Reviewed  I-STAT CHEM 8, ED - Abnormal; Notable for the following:    Glucose, Bld 101 (*)    Hemoglobin 15.3 (*)    All other components within normal limits  CBC WITH DIFFERENTIAL/PLATELET  D-DIMER, QUANTITATIVE  URINALYSIS, ROUTINE W REFLEX MICROSCOPIC  I-STAT TROPOININ, ED    Imaging Review Dg Chest 2 View  01/14/2015   CLINICAL DATA:  79 year old female with pain medial to the left scapular for 1 day. Initial encounter.  EXAM: CHEST  2 VIEW  COMPARISON:  Right shoulder CT 04/22/2014. Lumbar radiographs 06/10/2013.  FINDINGS: Mid thoracic compression fracture with anterior wedging and moderate to severe loss of vertebral body height, age indeterminate. Osteopenia elsewhere. The lower thoracic levels appear stable and intact compared to 2014.  Large lung volumes with increased AP dimension to the chest, exaggerated kyphosis. Mild cardiomegaly. Other mediastinal contours are within normal limits. Visualized tracheal air column is within  normal limits. No pneumothorax, pulmonary edema, pleural effusion or confluent pulmonary opacity. Chronic right proximal humerus fracture.  IMPRESSION: 1. Moderate to severe mid thoracic compression fracture, age indeterminate. If pain is referable to this level and specific therapy such as vertebroplasty is desired, thoracic MRI or whole-body bone scan would best evaluate further. 2. Pulmonary hyperinflation and mild cardiomegaly. No acute cardiopulmonary abnormality.   Electronically Signed   By: Genevie Ann M.D.   On: 01/14/2015 16:12   Ct Angio Chest Pe W/cm &/or Wo Cm  01/14/2015   CLINICAL DATA:  LEFT-sided back pain with onset of symptoms at 4 a.m. today. Shortness of breath with movement.  EXAM: CT ANGIOGRAPHY CHEST WITH CONTRAST  TECHNIQUE: Multidetector CT imaging of the chest was performed using the standard protocol during  bolus administration of intravenous contrast. Multiplanar CT image reconstructions and MIPs were obtained to evaluate the vascular anatomy.  CONTRAST:  70mL OMNIPAQUE IOHEXOL 350 MG/ML SOLN  COMPARISON:  Chest radiograph 01/14/2015  FINDINGS: Bones: Chronic T7 compression fractures present with 75% loss of vertebral body height anteriorly. No retropulsion or resulting stenosis. There is no paravertebral phlegmon around this fracture. No other compression fractures. No aggressive osseous lesions.  Cardiovascular: Technically adequate study for evaluation of pulmonary embolus. No pulmonary embolism is present. Coronary artery atherosclerosis.  Lungs: Dependent atelectasis. No airspace disease. Linear scarring or subsegmental atelectasis in the RIGHT middle lobe.  Central airways: Patent.  Effusions: None.  Lymphadenopathy: No axillary adenopathy. No mediastinal or hilar adenopathy.  Esophagus: Small hiatal hernia.  Upper abdomen: Cholelithiasis.  No acute abdominal abnormality.  Other: Bilateral thyroid nodules are present, bilateral thyroid nodules are present, measuring under 15 mm in the visible portions. No further evaluation is recommended at this time. This follows ACR consensus guidelines: Managing Incidental Thyroid Nodules Detected on Imaging: White Paper of the ACR Incidental Thyroid Findings Committee. J Am Coll Radiol 2015; 12:143-150.  Review of the MIP images confirms the above findings.  IMPRESSION: 1. No acute cardiopulmonary disease. 2. Negative for pulmonary embolism. 3. Atherosclerosis and coronary artery disease. 4. Chronic T7 compression fracture.   Electronically Signed   By: Dereck Ligas M.D.   On: 01/14/2015 18:41     EKG Interpretation   Date/Time:  Tuesday Jan 14 2015 14:10:24 EDT Ventricular Rate:  68 PR Interval:  175 QRS Duration: 151 QT Interval:  444 QTC Calculation: 472 R Axis:   35 Text Interpretation:  Sinus rhythm Right bundle branch block No  significant change  since last tracing Confirmed by Winfred Leeds  MD, SAM  [phone]) on 01/14/2015 3:37:03 PM      MDM   Final diagnoses:  Dyspnea on exertion  Left-sided thoracic back pain  Near syncope    patient with left upper back pain, not reproducible on examination or with breathing. She is also complaining of exertional dyspnea and near syncopal episodes. Initial blood pressure by EMS elevated, currently normal vital signs. EKG showing right bundle branch block otherwise unremarkable. She is in no acute distress while laying in bed. Will get labs, chest x-ray, will get a d-dimer. Will continue to monitor.  D dimer elevated, will get ct angio.  CT negative.  Discussed patient with Dr. Cathleen Fears who has seen her as well. He agrees that her new dyspnea on exertion is concerning. Patient states she is unable to walk from her daughter to the car without getting short of breath. This is new within the last week. Will admit her for monitor and further evaluation.  Filed Vitals:   01/14/15 1930 01/14/15 1945 01/14/15 2058 01/14/15 2128  BP: 120/65  120/65 124/64  Pulse: 71 73 73 79  Temp:    97.8 F (36.6 C)  TempSrc:    Oral  Resp:   17 16  Height:    5\' 6"  (1.676 m)  Weight:    180 lb 11.2 oz (81.965 kg)  SpO2: 95% 97% 97% 97%      Jeannett Senior, PA-C 01/15/15 [place], MD 01/15/15 [phone]

## 2015-01-14 NOTE — H&P (Signed)
[facility] History and Physical  Patient: [name] [name]  MRN: [id number]  DOB: [birth year]  DOS: the patient was seen and examined on 01/14/2015 PCP: Henrine Screws, MD  Referring physician: Orlie Dakin, MD Chief Complaint: Shortness of breath on exertion  HPI: [name] [name] is a 79 y.o. female with Past medical history of hypertension. Patient presents with complaints of shortness of breath on exertion as well as near syncope. She mentions that while she was talking with her friends today she showed only filed that she was given a pass out and she had to sit down after sitting down she checked her blood pressure and it was elevated at 831 systolic and therefore she came to the hospital as she was not feeling good. She mentions that she has been started on blood pressure medication recently in the last 6 months and has been there having difficulty maintaining her blood pressure. Her blood pressure but fluctuated throughout the day from 90 systolic to 517 systolic. She checks her blood pressure multiple times during the day as she feels that she is going to pass out. She mentions this episodes of near passing out happens once every   week. She also feels palpitation with that. She had nausea but no vomiting today. She has some diaphoresis as well today. She denies any chest pain chest heaviness chest tightness fever or chills. She mentions she has lost some weights. She has leg swelling. She denies any diarrhea or constipation denies any burning urination.  The patient is coming from home. And at her baseline independent for most of her ADL.  Review of Systems: as mentioned in the history of present illness.  A comprehensive review of the other systems is negative.  Past Medical History  Diagnosis Date  . Hypertension   . Broken arm Right   History reviewed. No pertinent past surgical history. Social History:  reports that she quit smoking about 28 years ago. She  does not have any smokeless tobacco history on file. She reports that she drinks alcohol. She reports that she does not use illicit drugs.  Allergies  Allergen Reactions  . Hctz [Hydrochlorothiazide] Shortness Of Breath and Other (See Comments)    Very short winded, made her hurt all over   . Losartan Other (See Comments)    Skin crawling feelings  . Macrolides And Ketolides Other (See Comments)    Pyloric sphincter flareups-unsure of which -mycins  . Benazepril Other (See Comments)    Doesn't work for patient  . Penicillins Other (See Comments)    abd pain     Family History  Problem Relation Age of Onset  . Heart failure Mother   . Hypertension Mother   . Hypertension Father   . Atrial fibrillation Father     Prior to Admission medications   Medication Sig Start Date End Date Taking? Authorizing Provider  amLODipine (NORVASC) 5 MG tablet Take 2.5-5 mg by mouth daily.   Yes Historical Provider, MD  aspirin 325 MG tablet Take 325 mg by mouth daily.   Yes Historical Provider, MD  calcium carbonate (TUMS EX) 750 MG chewable tablet Chew 1 tablet by mouth daily.   Yes Historical Provider, MD  Cholecalciferol (VITAMIN D3) 2000 UNITS TABS Take 2,000 Units by mouth daily.   Yes Historical Provider, MD  Cyanocobalamin (VITAMIN B-12 PO) Take 1 tablet by mouth daily.   Yes Historical Provider, MD  famotidine (PEPCID AC) 10 MG chewable tablet Chew 10 mg by mouth at bedtime.  Yes Historical Provider, MD  fluticasone (FLONASE) 50 MCG/ACT nasal spray Place 2 sprays into both nostrils daily. 12/27/14  Yes Historical Provider, MD  Magnesium 250 MG TABS Take 250 mg by mouth daily.   Yes Historical Provider, MD  Multiple Vitamins-Minerals (ICAPS MV PO) Take 1 tablet by mouth daily.   Yes Historical Provider, MD  triamterene-hydrochlorothiazide (MAXZIDE-25) 37.5-25 MG per tablet Take 1 tablet by mouth daily. 12/27/14  Yes Historical Provider, MD  vitamin C (ASCORBIC ACID) 500 MG tablet Take 500 mg  by mouth daily.   Yes Historical Provider, MD    Physical Exam: Filed Vitals:   01/14/15 1915 01/14/15 1930 01/14/15 1945 01/14/15 2058  BP:  120/65  120/65  Pulse: 74 71 73 73  Temp:      TempSrc:      Resp:    17  Height:      Weight:      SpO2: 99% 95% 97% 97%    General: Alert, Awake and Oriented to Time, Place and Person. Appear in mild distress Eyes: PERRL ENT: Oral Mucosa clear moist. Neck: no JVD Cardiovascular: S1 and S2 Present, aortic systolic Murmur, Peripheral Pulses Present Respiratory: Bilateral Air entry equal and Decreased,  faint basal Crackles, no wheezes Abdomen: Bowel Sound  presentoft and non tender Skin: no Rash Extremities: no Pedal edema, no calf tenderness Neurologic: Grossly no focal neuro deficit.  Labs on Admission:  CBC:  Recent Labs Lab 01/14/15 1449 01/14/15 1534  WBC  --  8.1  NEUTROABS  --  5.5  HGB 15.3* 15.2*  HCT 45.0 46.0  MCV  --  90.2  PLT  --  281    CMP     Component Value Date/Time   NA 137 01/14/2015 1449   K 4.0 01/14/2015 1449   CL 101 01/14/2015 1449   CO2 23 09/02/2014 1313   GLUCOSE 101* 01/14/2015 1449   BUN 17 01/14/2015 1449   CREATININE 0.90 01/14/2015 1449   CALCIUM 9.7 09/02/2014 1313   GFRNONAA 84* 09/02/2014 1313   GFRAA >90 09/02/2014 1313    No results for input(s): LIPASE, AMYLASE in the last 168 hours.  No results for input(s): CKTOTAL, CKMB, CKMBINDEX, TROPONINI in the last 168 hours. BNP (last 3 results) No results for input(s): BNP in the last 8760 hours.  ProBNP (last 3 results) No results for input(s): PROBNP in the last 8760 hours.   Radiological Exams on Admission: Dg Chest 2 View  01/14/2015   CLINICAL DATA:  79 year old female with pain medial to the left scapular for 1 day. Initial encounter.  EXAM: CHEST  2 VIEW  COMPARISON:  Right shoulder CT 04/22/2014. Lumbar radiographs 06/10/2013.  FINDINGS: Mid thoracic compression fracture with anterior wedging and moderate to severe  loss of vertebral body height, age indeterminate. Osteopenia elsewhere. The lower thoracic levels appear stable and intact compared to 2014.  Large lung volumes with increased AP dimension to the chest, exaggerated kyphosis. Mild cardiomegaly. Other mediastinal contours are within normal limits. Visualized tracheal air column is within normal limits. No pneumothorax, pulmonary edema, pleural effusion or confluent pulmonary opacity. Chronic right proximal humerus fracture.  IMPRESSION: 1. Moderate to severe mid thoracic compression fracture, age indeterminate. If pain is referable to this level and specific therapy such as vertebroplasty is desired, thoracic MRI or whole-body bone scan would best evaluate further. 2. Pulmonary hyperinflation and mild cardiomegaly. No acute cardiopulmonary abnormality.   Electronically Signed   By: Genevie Ann M.D.   On:  01/14/2015 16:12   Ct Angio Chest Pe W/cm &/or Wo Cm  01/14/2015   CLINICAL DATA:  LEFT-sided back pain with onset of symptoms at 4 a.m. today. Shortness of breath with movement.  EXAM: CT ANGIOGRAPHY CHEST WITH CONTRAST  TECHNIQUE: Multidetector CT imaging of the chest was performed using the standard protocol during bolus administration of intravenous contrast. Multiplanar CT image reconstructions and MIPs were obtained to evaluate the vascular anatomy.  CONTRAST:  71mL OMNIPAQUE IOHEXOL 350 MG/ML SOLN  COMPARISON:  Chest radiograph 01/14/2015  FINDINGS: Bones: Chronic T7 compression fractures present with 75% loss of vertebral body height anteriorly. No retropulsion or resulting stenosis. There is no paravertebral phlegmon around this fracture. No other compression fractures. No aggressive osseous lesions.  Cardiovascular: Technically adequate study for evaluation of pulmonary embolus. No pulmonary embolism is present. Coronary artery atherosclerosis.  Lungs: Dependent atelectasis. No airspace disease. Linear scarring or subsegmental atelectasis in the RIGHT middle  lobe.  Central airways: Patent.  Effusions: None.  Lymphadenopathy: No axillary adenopathy. No mediastinal or hilar adenopathy.  Esophagus: Small hiatal hernia.  Upper abdomen: Cholelithiasis.  No acute abdominal abnormality.  Other: Bilateral thyroid nodules are present, bilateral thyroid nodules are present, measuring under 15 mm in the visible portions. No further evaluation is recommended at this time. This follows ACR consensus guidelines: Managing Incidental Thyroid Nodules Detected on Imaging: White Paper of the ACR Incidental Thyroid Findings Committee. J Am Coll Radiol 2015; 12:143-150.  Review of the MIP images confirms the above findings.  IMPRESSION: 1. No acute cardiopulmonary disease. 2. Negative for pulmonary embolism. 3. Atherosclerosis and coronary artery disease. 4. Chronic T7 compression fracture.   Electronically Signed   By: Dereck Ligas M.D.   On: 01/14/2015 18:41   EKG: Independently reviewed. normal sinus rhythm, nonspecific ST and T waves changes.  Assessment/Plan Principal Problem:   Near syncope Active Problems:   Dyspnea   Hypertension   1. Near syncope The patient is presenting with complaints of multiple episodes of near syncope without passing out episode. She denies any dizziness lightheadedness or vertigo with this episodes she just felt as if she is going to pass out. At present she does not have any neurological deficit or any other confusion to suggest any intracranial abnormality. We will continue closely monitor her on telemetry. Recheck orthostatic again in the morning. Orthostatic once here is negative. She mentions her blood pressure is elevated when she is having the symptoms. Most likely anxiety is associated with this. We need to rule out any organic causes. We get an echocardiogram in the morning.  2.Dyspnea on exertion. Patient has some dyspnea on exertion. Chest x-ray is clear. She is not hypoxic at present. We will get an echocardiogram  in the morning.  3. essential hypertension. Blood pressure currently stable. Continuing home medications.  4. elevated d-dimer with dyspnea. CAD angiographic chest negative.  Advance goals of care discussion: full code   DVT Prophylaxis: subcutaneous Heparin Nutrition: Nothing by mouth after midnight  Family Communication: family was present at bedside, opportunity was given to ask question and all questions were answered satisfactorily at the time of interview. Disposition: Admitted as observation, telemetry unit.  Author: Berle Mull, MD [facility] Pager: [phone] 01/14/2015  If 7PM-7AM, please contact night-coverage [email or web address] Password [email or web address]

## 2015-01-14 NOTE — ED Notes (Signed)
Meal tray at bedside.  

## 2015-01-15 ENCOUNTER — Observation Stay (HOSPITAL_COMMUNITY): Payer: Medicare Other

## 2015-01-15 DIAGNOSIS — R55 Syncope and collapse: Secondary | ICD-10-CM

## 2015-01-15 DIAGNOSIS — Z959 Presence of cardiac and vascular implant and graft, unspecified: Secondary | ICD-10-CM | POA: Diagnosis not present

## 2015-01-15 DIAGNOSIS — I10 Essential (primary) hypertension: Secondary | ICD-10-CM | POA: Diagnosis present

## 2015-01-15 DIAGNOSIS — I441 Atrioventricular block, second degree: Secondary | ICD-10-CM | POA: Diagnosis not present

## 2015-01-15 DIAGNOSIS — R0600 Dyspnea, unspecified: Secondary | ICD-10-CM

## 2015-01-15 DIAGNOSIS — R001 Bradycardia, unspecified: Secondary | ICD-10-CM | POA: Diagnosis not present

## 2015-01-15 LAB — CBC WITH DIFFERENTIAL/PLATELET
Basophils Absolute: 0 10*3/uL (ref 0.0–0.1)
Basophils Relative: 1 % (ref 0–1)
EOS ABS: 0.3 10*3/uL (ref 0.0–0.7)
EOS PCT: 4 % (ref 0–5)
HCT: 47.8 % — ABNORMAL HIGH (ref 36.0–46.0)
Hemoglobin: 15.6 g/dL — ABNORMAL HIGH (ref 12.0–15.0)
LYMPHS ABS: 1.4 10*3/uL (ref 0.7–4.0)
Lymphocytes Relative: 22 % (ref 12–46)
MCH: 29.5 pg (ref 26.0–34.0)
MCHC: 32.6 g/dL (ref 30.0–36.0)
MCV: 90.5 fL (ref 78.0–100.0)
Monocytes Absolute: 0.8 10*3/uL (ref 0.1–1.0)
Monocytes Relative: 12 % (ref 3–12)
Neutro Abs: 3.9 10*3/uL (ref 1.7–7.7)
Neutrophils Relative %: 61 % (ref 43–77)
PLATELETS: 285 10*3/uL (ref 150–400)
RBC: 5.28 MIL/uL — ABNORMAL HIGH (ref 3.87–5.11)
RDW: 14.3 % (ref 11.5–15.5)
WBC: 6.3 10*3/uL (ref 4.0–10.5)

## 2015-01-15 LAB — COMPREHENSIVE METABOLIC PANEL
ALBUMIN: 4.1 g/dL (ref 3.5–5.0)
ALK PHOS: 105 U/L (ref 38–126)
ALT: 25 U/L (ref 14–54)
AST: 34 U/L (ref 15–41)
Anion gap: 14 (ref 5–15)
BUN: 15 mg/dL (ref 6–20)
CO2: 24 mmol/L (ref 22–32)
CREATININE: 1.03 mg/dL — AB (ref 0.44–1.00)
Calcium: 9.8 mg/dL (ref 8.9–10.3)
Chloride: 101 mmol/L (ref 101–111)
GFR calc Af Amer: 59 mL/min — ABNORMAL LOW (ref >60)
GFR calc non Af Amer: 51 mL/min — ABNORMAL LOW (ref >60)
Glucose, Bld: 89 mg/dL (ref 70–99)
POTASSIUM: 3.8 mmol/L (ref 3.5–5.1)
SODIUM: 139 mmol/L (ref 135–145)
Total Bilirubin: 0.8 mg/dL (ref 0.3–1.2)
Total Protein: 8.5 g/dL — ABNORMAL HIGH (ref 6.5–8.1)

## 2015-01-15 LAB — GLUCOSE, CAPILLARY: Glucose-Capillary: 85 mg/dL (ref 70–99)

## 2015-01-15 LAB — PROTIME-INR
INR: 1 (ref 0.00–1.49)
PROTHROMBIN TIME: 13.3 s (ref 11.6–15.2)

## 2015-01-15 LAB — TSH: TSH: 2.274 u[IU]/mL (ref 0.350–4.500)

## 2015-01-15 LAB — TROPONIN I: Troponin I: <0.03 ng/mL (ref <0.031)

## 2015-01-15 MED ORDER — SODIUM CHLORIDE 0.9 % IV SOLN
INTRAVENOUS | Status: DC
  Administered 2015-01-15 – 2015-01-16 (×2): via INTRAVENOUS

## 2015-01-15 MED ORDER — SODIUM CHLORIDE 0.9 % IV BOLUS (SEPSIS)
500.0000 mL | Freq: Once | INTRAVENOUS | Status: AC
  Administered 2015-01-15: 500 mL via INTRAVENOUS

## 2015-01-15 NOTE — Progress Notes (Signed)
  Echocardiogram 2D Echocardiogram has been performed.  [name] [name] 01/15/2015, 9:18 AM

## 2015-01-15 NOTE — Progress Notes (Signed)
PROGRESS NOTE  [name] [name] MGQ:[id number] DOB: [birth year] DOA: 01/14/2015 PCP: Henrine Screws, MD  HPI/Recap of past 24 hours: Patient reported chronic tinnitus on the right, reported 30pounds of weight loss since 07/2014, no prior memmagram or colonoscopy. Reported episodic hypertension with presyncope since 07/2014.   Assessment/Plan: Principal Problem:   Near syncope Active Problems:   Dyspnea   Hypertension  Episodic hypertension with presyncope per patient report, will get serum renin/aldosteron level/ urine metanephrines/renal artery doppler to r/o secondary HTN causes. bp well controlled here with norvasc.  DOE? WOB wnl, on room air, cxr unremarkable. CTA no PE, Echo lVEF wnl, grade 1 diastolic dysfunction. EKG sinus rhythm, chronic RBBB. Anxiety? Physical deconditioning?  Mild elevation of cr, gentle hydration, renal US pending, could be new baseline secondary to htn. Renal dosing meds. Hold diuretic for now. [place] no infection, no proteinuria.   Unintentional weight loss: tsh wnl, CTA no mass, will get FOBT for now (no anemia, no cough, no family history of cancer, former smoker quit in 1988) need outpatient cancer preventive screening.     Code Status: full  Family Communication: patient  Disposition Plan: remain in the hospital, likely discharge tomorrow.   Consultants:  none  Procedures:  echo  Antibiotics:  none   Objective: BP 120/66 mmHg  Pulse 77  Temp(Src) 98.1 F (36.7 C) (Oral)  Resp 17  Ht 5\' 6"  (1.676 m)  Wt 81.239 kg (179 lb 1.6 oz)  BMI 28.92 kg/m2  SpO2 99%  Intake/Output Summary (Last 24 hours) at 01/15/15 1524 Last data filed at 01/15/15 1518  Gross per 24 hour  Intake    200 ml  Output      0 ml  Net    200 ml   Filed Weights   01/14/15 1413 01/14/15 2128 01/15/15 0612  Weight: 80.74 kg (178 lb) 81.965 kg (180 lb 11.2 oz) 81.239 kg (179 lb 1.6 oz)    Exam:   General:  NAD  Cardiovascular: RRR  Respiratory:  CTABL  Abdomen: Soft/ND/NT, positive BS  Musculoskeletal: No Edema  Neuro: no focal findings  Data Reviewed: Basic Metabolic Panel:  Recent Labs Lab 01/14/15 1449 01/15/15 0835  NA 137 139  K 4.0 3.8  CL 101 101  CO2  --  24  GLUCOSE 101* 89  BUN 17 15  CREATININE 0.90 1.03*  CALCIUM  --  9.8   Liver Function Tests:  Recent Labs Lab 01/15/15 0835  AST 34  ALT 25  ALKPHOS 105  BILITOT 0.8  PROT 8.5*  ALBUMIN 4.1   No results for input(s): LIPASE, AMYLASE in the last 168 hours. No results for input(s): AMMONIA in the last 168 hours. CBC:  Recent Labs Lab 01/14/15 1449 01/14/15 1534 01/15/15 0835  WBC  --  8.1 6.3  NEUTROABS  --  5.5 3.9  HGB 15.3* 15.2* 15.6*  HCT 45.0 46.0 47.8*  MCV  --  90.2 90.5  PLT  --  281 285   Cardiac Enzymes:    Recent Labs Lab 01/14/15 2221 01/15/15 0350 01/15/15 0924  TROPONINI <0.03 <0.03 <0.03   BNP (last 3 results)  Recent Labs  01/14/15 2221  BNP 17.6    ProBNP (last 3 results) No results for input(s): PROBNP in the last 8760 hours.  CBG:  Recent Labs Lab 01/15/15 0610  GLUCAP 85    No results found for this or any previous visit (from the past 240 hour(s)).   Studies: Dg Chest 2 View  01/14/2015  CLINICAL DATA:  79 year old female with pain medial to the left scapular for 1 day. Initial encounter.  EXAM: CHEST  2 VIEW  COMPARISON:  Right shoulder CT 04/22/2014. Lumbar radiographs 06/10/2013.  FINDINGS: Mid thoracic compression fracture with anterior wedging and moderate to severe loss of vertebral body height, age indeterminate. Osteopenia elsewhere. The lower thoracic levels appear stable and intact compared to 2014.  Large lung volumes with increased AP dimension to the chest, exaggerated kyphosis. Mild cardiomegaly. Other mediastinal contours are within normal limits. Visualized tracheal air column is within normal limits. No pneumothorax, pulmonary edema, pleural effusion or confluent pulmonary  opacity. Chronic right proximal humerus fracture.  IMPRESSION: 1. Moderate to severe mid thoracic compression fracture, age indeterminate. If pain is referable to this level and specific therapy such as vertebroplasty is desired, thoracic MRI or whole-body bone scan would best evaluate further. 2. Pulmonary hyperinflation and mild cardiomegaly. No acute cardiopulmonary abnormality.   Electronically Signed   By: Genevie Ann M.D.   On: 01/14/2015 16:12   Ct Angio Chest Pe W/cm &/or Wo Cm  01/14/2015   CLINICAL DATA:  LEFT-sided back pain with onset of symptoms at 4 a.m. today. Shortness of breath with movement.  EXAM: CT ANGIOGRAPHY CHEST WITH CONTRAST  TECHNIQUE: Multidetector CT imaging of the chest was performed using the standard protocol during bolus administration of intravenous contrast. Multiplanar CT image reconstructions and MIPs were obtained to evaluate the vascular anatomy.  CONTRAST:  17mL OMNIPAQUE IOHEXOL 350 MG/ML SOLN  COMPARISON:  Chest radiograph 01/14/2015  FINDINGS: Bones: Chronic T7 compression fractures present with 75% loss of vertebral body height anteriorly. No retropulsion or resulting stenosis. There is no paravertebral phlegmon around this fracture. No other compression fractures. No aggressive osseous lesions.  Cardiovascular: Technically adequate study for evaluation of pulmonary embolus. No pulmonary embolism is present. Coronary artery atherosclerosis.  Lungs: Dependent atelectasis. No airspace disease. Linear scarring or subsegmental atelectasis in the RIGHT middle lobe.  Central airways: Patent.  Effusions: None.  Lymphadenopathy: No axillary adenopathy. No mediastinal or hilar adenopathy.  Esophagus: Small hiatal hernia.  Upper abdomen: Cholelithiasis.  No acute abdominal abnormality.  Other: Bilateral thyroid nodules are present, bilateral thyroid nodules are present, measuring under 15 mm in the visible portions. No further evaluation is recommended at this time. This follows ACR  consensus guidelines: Managing Incidental Thyroid Nodules Detected on Imaging: White Paper of the ACR Incidental Thyroid Findings Committee. J Am Coll Radiol 2015; 12:143-150.  Review of the MIP images confirms the above findings.  IMPRESSION: 1. No acute cardiopulmonary disease. 2. Negative for pulmonary embolism. 3. Atherosclerosis and coronary artery disease. 4. Chronic T7 compression fracture.   Electronically Signed   By: Dereck Ligas M.D.   On: 01/14/2015 18:41    Scheduled Meds: . amLODipine  2.5 mg Oral Daily  . aspirin  325 mg Oral Daily  . enoxaparin (LOVENOX) injection  40 mg Subcutaneous Q24H  . famotidine  10 mg Oral QHS  . fluticasone  2 spray Each Nare Daily  . magnesium oxide  200 mg Oral Daily  . multivitamin-lutein  1 capsule Oral Daily  . sodium chloride  3 mL Intravenous Q12H  . triamterene-hydrochlorothiazide  1 tablet Oral Daily  . vitamin C  500 mg Oral Daily    Continuous Infusions: . sodium chloride       Time spent: 56mins  Lilliahna Schubring MD, PhD  [facility] Pager [phone]. If 7PM-7AM, please contact night-coverage at [email or web address], password [facility] 01/15/2015, 3:24 PM

## 2015-01-16 ENCOUNTER — Encounter (HOSPITAL_COMMUNITY): Payer: Self-pay | Admitting: Cardiology

## 2015-01-16 ENCOUNTER — Encounter (HOSPITAL_COMMUNITY)
Admission: EM | Disposition: A | Discharge status: Home / Self Care | Payer: Medicare Other | Source: Home / Self Care | Attending: Internal Medicine

## 2015-01-16 DIAGNOSIS — M7989 Other specified soft tissue disorders: Secondary | ICD-10-CM | POA: Diagnosis present

## 2015-01-16 DIAGNOSIS — R634 Abnormal weight loss: Secondary | ICD-10-CM | POA: Diagnosis present

## 2015-01-16 DIAGNOSIS — Z7982 Long term (current) use of aspirin: Secondary | ICD-10-CM | POA: Diagnosis not present

## 2015-01-16 DIAGNOSIS — R531 Weakness: Secondary | ICD-10-CM | POA: Diagnosis present

## 2015-01-16 DIAGNOSIS — R0609 Other forms of dyspnea: Secondary | ICD-10-CM | POA: Diagnosis present

## 2015-01-16 DIAGNOSIS — I10 Essential (primary) hypertension: Secondary | ICD-10-CM | POA: Diagnosis present

## 2015-01-16 DIAGNOSIS — R001 Bradycardia, unspecified: Secondary | ICD-10-CM

## 2015-01-16 DIAGNOSIS — Z87891 Personal history of nicotine dependence: Secondary | ICD-10-CM | POA: Diagnosis not present

## 2015-01-16 DIAGNOSIS — Z7951 Long term (current) use of inhaled steroids: Secondary | ICD-10-CM | POA: Diagnosis not present

## 2015-01-16 DIAGNOSIS — Z8249 Family history of ischemic heart disease and other diseases of the circulatory system: Secondary | ICD-10-CM | POA: Diagnosis not present

## 2015-01-16 DIAGNOSIS — Z880 Allergy status to penicillin: Secondary | ICD-10-CM | POA: Diagnosis not present

## 2015-01-16 DIAGNOSIS — I2584 Coronary atherosclerosis due to calcified coronary lesion: Secondary | ICD-10-CM | POA: Diagnosis present

## 2015-01-16 DIAGNOSIS — I4519 Other right bundle-branch block: Secondary | ICD-10-CM | POA: Diagnosis present

## 2015-01-16 DIAGNOSIS — I441 Atrioventricular block, second degree: Secondary | ICD-10-CM | POA: Insufficient documentation

## 2015-01-16 DIAGNOSIS — Z959 Presence of cardiac and vascular implant and graft, unspecified: Secondary | ICD-10-CM | POA: Diagnosis not present

## 2015-01-16 DIAGNOSIS — R55 Syncope and collapse: Secondary | ICD-10-CM | POA: Diagnosis present

## 2015-01-16 DIAGNOSIS — I2510 Atherosclerotic heart disease of native coronary artery without angina pectoris: Secondary | ICD-10-CM | POA: Diagnosis present

## 2015-01-16 HISTORY — PX: EP IMPLANTABLE DEVICE: SHX172B

## 2015-01-16 LAB — CBC
HCT: 42.2 % (ref 36.0–46.0)
HEMOGLOBIN: 13.8 g/dL (ref 12.0–15.0)
MCH: 29.7 pg (ref 26.0–34.0)
MCHC: 32.7 g/dL (ref 30.0–36.0)
MCV: 90.8 fL (ref 78.0–100.0)
PLATELETS: 251 10*3/uL (ref 150–400)
RBC: 4.65 MIL/uL (ref 3.87–5.11)
RDW: 14.5 % (ref 11.5–15.5)
WBC: 6.3 10*3/uL (ref 4.0–10.5)

## 2015-01-16 LAB — COMPREHENSIVE METABOLIC PANEL
ALK PHOS: 86 U/L (ref 38–126)
ALT: 19 U/L (ref 14–54)
AST: 28 U/L (ref 15–41)
Albumin: 3.5 g/dL (ref 3.5–5.0)
Anion gap: 8 (ref 5–15)
BUN: 15 mg/dL (ref 6–20)
CO2: 25 mmol/L (ref 22–32)
Calcium: 9.3 mg/dL (ref 8.9–10.3)
Chloride: 108 mmol/L (ref 101–111)
Creatinine, Ser: 0.85 mg/dL (ref 0.44–1.00)
Glucose, Bld: 90 mg/dL (ref 65–99)
POTASSIUM: 4 mmol/L (ref 3.5–5.1)
SODIUM: 141 mmol/L (ref 135–145)
TOTAL PROTEIN: 6.6 g/dL (ref 6.5–8.1)
Total Bilirubin: 0.7 mg/dL (ref 0.3–1.2)

## 2015-01-16 LAB — TROPONIN I: Troponin I: <0.03 ng/mL (ref <0.031)

## 2015-01-16 LAB — LIPID PANEL
CHOL/HDL RATIO: 2 ratio
CHOLESTEROL: 136 mg/dL (ref 0–200)
HDL: 67 mg/dL (ref >40)
LDL CALC: 52 mg/dL (ref 0–99)
TRIGLYCERIDES: 84 mg/dL (ref <150)
VLDL: 17 mg/dL (ref 0–40)

## 2015-01-16 LAB — HEMOGLOBIN A1C
HEMOGLOBIN A1C: 5.7 % — AB (ref 4.8–5.6)
Mean Plasma Glucose: 117 mg/dL

## 2015-01-16 LAB — BRAIN NATRIURETIC PEPTIDE: B Natriuretic Peptide: 22.4 pg/mL (ref 0.0–100.0)

## 2015-01-16 LAB — GLUCOSE, CAPILLARY: Glucose-Capillary: 102 mg/dL — ABNORMAL HIGH (ref 65–99)

## 2015-01-16 LAB — MAGNESIUM: MAGNESIUM: 2.1 mg/dL (ref 1.7–2.4)

## 2015-01-16 LAB — MRSA PCR SCREENING: MRSA by PCR: NEGATIVE

## 2015-01-16 SURGERY — PACEMAKER IMPLANT
Anesthesia: LOCAL

## 2015-01-16 MED ORDER — VANCOMYCIN HCL IN DEXTROSE 1-5 GM/200ML-% IV SOLN
1000.0000 mg | INTRAVENOUS | Status: AC
  Administered 2015-01-16: 1000 mg via INTRAVENOUS
  Filled 2015-01-16: qty 200

## 2015-01-16 MED ORDER — MIDAZOLAM HCL 5 MG/5ML IJ SOLN
INTRAMUSCULAR | Status: AC
  Filled 2015-01-16: qty 5

## 2015-01-16 MED ORDER — FENTANYL CITRATE (PF) 100 MCG/2ML IJ SOLN
INTRAMUSCULAR | Status: AC
  Filled 2015-01-16: qty 2

## 2015-01-16 MED ORDER — VANCOMYCIN HCL IN DEXTROSE 1-5 GM/200ML-% IV SOLN
1000.0000 mg | Freq: Two times a day (BID) | INTRAVENOUS | Status: AC
  Administered 2015-01-17: 1000 mg via INTRAVENOUS
  Filled 2015-01-16: qty 200

## 2015-01-16 MED ORDER — FENTANYL CITRATE (PF) 100 MCG/2ML IJ SOLN
INTRAMUSCULAR | Status: DC | PRN
  Administered 2015-01-16: 12.5 ug via INTRAVENOUS

## 2015-01-16 MED ORDER — CHLORHEXIDINE GLUCONATE 4 % EX LIQD
60.0000 mL | Freq: Once | CUTANEOUS | Status: AC
  Administered 2015-01-16: 4 via TOPICAL

## 2015-01-16 MED ORDER — SODIUM CHLORIDE 0.9 % IV SOLN: 250.0000 mL | INTRAVENOUS | Status: DC | PRN

## 2015-01-16 MED ORDER — SODIUM CHLORIDE 0.9 % IJ SOLN
3.0000 mL | Freq: Two times a day (BID) | INTRAMUSCULAR | Status: DC
  Administered 2015-01-17 – 2015-01-18 (×3): 3 mL via INTRAVENOUS

## 2015-01-16 MED ORDER — ACETAMINOPHEN 325 MG PO TABS
325.0000 mg | ORAL_TABLET | ORAL | Status: DC | PRN
  Administered 2015-01-16 – 2015-01-17 (×4): 650 mg via ORAL
  Filled 2015-01-16 (×4): qty 2

## 2015-01-16 MED ORDER — IOHEXOL 350 MG/ML SOLN
INTRAVENOUS | Status: DC | PRN
  Administered 2015-01-16: 15 mL via INTRAVENOUS

## 2015-01-16 MED ORDER — ATROPINE SULFATE 0.1 MG/ML IJ SOLN: 1.0000 mg | INTRAMUSCULAR | Status: DC | PRN

## 2015-01-16 MED ORDER — CHLORHEXIDINE GLUCONATE 4 % EX LIQD
60.0000 mL | Freq: Once | CUTANEOUS | Status: AC
  Administered 2015-01-16: 4 via TOPICAL
  Filled 2015-01-16: qty 60

## 2015-01-16 MED ORDER — HEPARIN (PORCINE) IN NACL 2-0.9 UNIT/ML-% IJ SOLN
INTRAMUSCULAR | Status: AC
  Filled 2015-01-16: qty 500

## 2015-01-16 MED ORDER — SODIUM CHLORIDE 0.9 % IR SOLN
80.0000 mg | Status: DC
  Filled 2015-01-16: qty 2

## 2015-01-16 MED ORDER — MIDAZOLAM HCL 5 MG/5ML IJ SOLN
INTRAMUSCULAR | Status: DC | PRN
  Administered 2015-01-16 (×2): 1 mg via INTRAVENOUS

## 2015-01-16 MED ORDER — HYDROCODONE-ACETAMINOPHEN 5-325 MG PO TABS: 1.0000 | ORAL_TABLET | ORAL | Status: DC | PRN

## 2015-01-16 MED ORDER — ATROPINE SULFATE 0.1 MG/ML IJ SOLN
INTRAMUSCULAR | Status: AC
  Administered 2015-01-16: 06:00:00
  Filled 2015-01-16: qty 10

## 2015-01-16 MED ORDER — ONDANSETRON HCL 4 MG/2ML IJ SOLN: 4.0000 mg | Freq: Four times a day (QID) | INTRAMUSCULAR | Status: DC | PRN

## 2015-01-16 MED ORDER — SODIUM CHLORIDE 0.9 % IJ SOLN: 3.0000 mL | INTRAMUSCULAR | Status: DC | PRN

## 2015-01-16 MED ORDER — SODIUM CHLORIDE 0.9 % IV SOLN: INTRAVENOUS | Status: DC

## 2015-01-16 SURGICAL SUPPLY — 11 items
CABLE SURGICAL S-101-97-12 (CABLE) ×2 IMPLANT
ELECT DEFIB PAD ADLT CADENCE (PAD) IMPLANT
KIT ESSENTIALS PG (KITS) IMPLANT
LEAD CAPSURE NOVUS 45CM (Lead) ×2 IMPLANT
LEAD CAPSURE NOVUS 5092-58CM (Lead) ×2 IMPLANT
PACEMAKER ADAPTA DR ADDRL1 (Pacemaker) ×1 IMPLANT
PAD DEFIB LIFELINK (PAD) ×2 IMPLANT
PPM ADAPTA DR ADDRL1 (Pacemaker) ×2 IMPLANT
SET INTRODUCER MICROPUNCT 5F (INTRODUCER) ×2 IMPLANT
SHEATH CLASSIC 7F (SHEATH) ×4 IMPLANT
TRAY PACEMAKER INSERTION (CUSTOM PROCEDURE TRAY) ×2 IMPLANT

## 2015-01-16 NOTE — Progress Notes (Signed)
Patient asked for assistance to go to bathroom to try have bowel movement.Explained to patient not to strain to allow stool to come down without straining.Patient verbalized understanding and said she would not strain.While sitting toilet patient heart rate dropped down to 43 .Asked patient if she was straining.She said how could you tell.I explained to her that her heart rate dropped from 65 down 43.Patient stated ,"I stop straining'.Her heart rate went back up 60-70's sinus rhythm.

## 2015-01-16 NOTE — Progress Notes (Signed)
Notified at 0540 by monitor tech that patient had run of second degree heart block type 2 returning to sinus rhythm at 0526.Upon entering room patient getting back in bed from using restroom nurse tech at bedside.Patient denies chest pain,palpitations,shortness of breath ,and dizziness. Blood pressure 127/65 heart rate 62.Text paged Forrest Moron NP.

## 2015-01-16 NOTE — Progress Notes (Signed)
Order received for atropine at bedside  and 12 lead ekg will continue to monitor patient.

## 2015-01-16 NOTE — Progress Notes (Signed)
Cardiology and EP consulted on pt, agreed with transfer to [place] (no step down beds available). Husband Deidre Ala informed via phone and arrived on floor before transfer. Remains in 2nd degree type II block with intermittent back to SR. C/o feeling lightheaded frequently and dizzy, BP stable but fluctuating 90-140/70's, 100% on 2L. Report called to RN on [place]. As patient was being readied for transfer RN received call from Miguel Barrera that patient had beats of VT. Advised that patient was being taken to [place] momentarily. 2H RN notified. Pt and belongings taken over in bed. Tolerated transfer well.

## 2015-01-16 NOTE — Consult Note (Signed)
ELECTROPHYSIOLOGY CONSULT NOTE    Patient ID: [name] [name] MRN: [id number], DOB/AGE: [birth year] 79 y.o.  Admit date: 01/14/2015 Date of Consult: 01/16/2015  Primary Physician: Henrine Screws, MD Primary Cardiologist: Hochrein (new this admission)  Reason for Consultation: pre-syncope/heart block  HPI:  [name] [name] is a 79 y.o. female with a past medical history significant for hypertension and pre-syncope.  She has had episodes of pre-syncope since November of 2015.  Her spells are described as dizziness that lasts for minutes and then resolve. She has not had frank syncope. She feels that her blood pressure goes up with these episodes.  She presented to the hospital on 01/14/15 with shortness of breath on exertion that has resolved. D-dimer was positive, but CTA was negative for PE.  Echocardiogram demonstrated EF 40-98%, grade 1 diastolic dysfunction, LA mildly dilated.  Lab work is otherwise unremarkable.  Telemetry has demonstrated intermittent 2:1 heart block with symptoms consistent with pre-syncopal spells.  She is on no AV nodal blocking agents.   She remains active at home but is "slow".  She denies chest pain, shortness of breath, LE edema, recent fevers, chills, nausea or vomiting.   EP has been asked to evaluate for treatment options.   Past Medical History  Diagnosis Date  . Hypertension   . Broken arm Right     Surgical History:  Past Surgical History  Procedure Laterality Date  . None       Prescriptions prior to admission  Medication Sig Dispense Refill Last Dose  . amLODipine (NORVASC) 5 MG tablet Take 2.5-5 mg by mouth daily.     Marland Kitchen aspirin 325 MG tablet Take 325 mg by mouth daily.   01/14/2015 at Unknown time  . calcium carbonate (TUMS EX) 750 MG chewable tablet Chew 1 tablet by mouth daily.   01/14/2015 at Unknown time  . Cholecalciferol (VITAMIN D3) 2000 UNITS TABS Take 2,000 Units by mouth daily.   01/14/2015 at Unknown time  . Cyanocobalamin  (VITAMIN B-12 PO) Take 1 tablet by mouth daily.   01/13/2015 at Unknown time  . famotidine (PEPCID AC) 10 MG chewable tablet Chew 10 mg by mouth at bedtime.   01/13/2015 at Unknown time  . fluticasone (FLONASE) 50 MCG/ACT nasal spray Place 2 sprays into both nostrils daily.   01/14/2015 at Unknown time  . Magnesium 250 MG TABS Take 250 mg by mouth daily.   01/14/2015 at Unknown time  . Multiple Vitamins-Minerals (ICAPS MV PO) Take 1 tablet by mouth daily.   01/14/2015 at Unknown time  . triamterene-hydrochlorothiazide (MAXZIDE-25) 37.5-25 MG per tablet Take 1 tablet by mouth daily.   01/14/2015 at Unknown time  . vitamin C (ASCORBIC ACID) 500 MG tablet Take 500 mg by mouth daily.   01/14/2015 at Unknown time    Inpatient Medications:  . amLODipine  2.5 mg Oral Daily  . enoxaparin (LOVENOX) injection  40 mg Subcutaneous Q24H  . famotidine  10 mg Oral QHS  . fluticasone  2 spray Each Nare Daily  . magnesium oxide  200 mg Oral Daily  . multivitamin-lutein  1 capsule Oral Daily  . sodium chloride  3 mL Intravenous Q12H  . vitamin C  500 mg Oral Daily    Allergies:  Allergies  Allergen Reactions  . Hctz [Hydrochlorothiazide] Shortness Of Breath and Other (See Comments)    Very short winded, made her hurt all over   . Losartan Other (See Comments)    Skin crawling feelings  . Macrolides And Ketolides  Other (See Comments)    Pyloric sphincter flareups-unsure of which -mycins  . Benazepril Other (See Comments)    Doesn't work for patient  . Penicillins Other (See Comments)    abd pain     History   Social History  . Marital Status: Married    Spouse Name: N/A  . Number of Children: 2  . Years of Education: N/A   Occupational History  . Not on file.   Social History Main Topics  . Smoking status: Former Smoker    Types: Cigarettes    Quit date: 09/06/1986  . Smokeless tobacco: Not on file  . Alcohol Use: 0.0 oz/week    0 Standard drinks or equivalent per week     Comment:  occasionally  . Drug Use: No  . Sexual Activity: Not on file   Other Topics Concern  . Not on file   Social History Narrative   Lives with Deidre Ala her husband        Family History  Problem Relation Age of Onset  . Heart failure Mother   . Hypertension Mother   . Hypertension Father   . Atrial fibrillation Father      Review of Systems: All other systems reviewed and are otherwise negative except as noted above.  Physical Exam: Filed Vitals:   01/16/15 0805 01/16/15 0815 01/16/15 0825 01/16/15 0830  BP: 118/64 134/72 153/59 129/57  Pulse:      Temp:      TempSrc:      Resp:      Height:      Weight:      SpO2:        GEN- The patient is elderly appearing, alert and oriented x 3 today.   HEENT: normocephalic, atraumatic; sclera clear, conjunctiva pink; hearing intact; oropharynx clear; neck supple, no JVP Lymph- no cervical lymphadenopathy Lungs- Clear to ausculation bilaterally, normal work of breathing.  No wheezes, rales, rhonchi Heart- Regular rate and rhythm, 2/6 systolic murmur GI- soft, non-tender, non-distended, bowel sounds present, no hepatosplenomegaly Extremities- no clubbing, cyanosis, or edema; DP/PT/radial pulses 2+ bilaterally MS- no significant deformity or atrophy Skin- warm and dry, no rash or lesion Psych- euthymic mood, full affect Neuro- strength and sensation are intact  Labs:   Lab Results  Component Value Date   WBC 6.3 01/16/2015   HGB 13.8 01/16/2015   HCT 42.2 01/16/2015   MCV 90.8 01/16/2015   PLT 251 01/16/2015    Recent Labs Lab 01/16/15 0523  NA 141  K 4.0  CL 108  CO2 25  BUN 15  CREATININE 0.85  CALCIUM 9.3  PROT 6.6  BILITOT 0.7  ALKPHOS 86  ALT 19  AST 28  GLUCOSE 90      Radiology/Studies: Dg Chest 2 View 01/14/2015   CLINICAL DATA:  79 year old female with pain medial to the left scapular for 1 day. Initial encounter.  EXAM: CHEST  2 VIEW  COMPARISON:  Right shoulder CT 04/22/2014. Lumbar radiographs  06/10/2013.  FINDINGS: Mid thoracic compression fracture with anterior wedging and moderate to severe loss of vertebral body height, age indeterminate. Osteopenia elsewhere. The lower thoracic levels appear stable and intact compared to 2014.  Large lung volumes with increased AP dimension to the chest, exaggerated kyphosis. Mild cardiomegaly. Other mediastinal contours are within normal limits. Visualized tracheal air column is within normal limits. No pneumothorax, pulmonary edema, pleural effusion or confluent pulmonary opacity. Chronic right proximal humerus fracture.  IMPRESSION: 1. Moderate to severe mid thoracic  compression fracture, age indeterminate. If pain is referable to this level and specific therapy such as vertebroplasty is desired, thoracic MRI or whole-body bone scan would best evaluate further. 2. Pulmonary hyperinflation and mild cardiomegaly. No acute cardiopulmonary abnormality.   Electronically Signed   By: Genevie Ann M.D.   On: 01/14/2015 16:12   Ct Angio Chest Pe W/cm &/or Wo Cm 01/14/2015   CLINICAL DATA:  LEFT-sided back pain with onset of symptoms at 4 a.m. today. Shortness of breath with movement.  EXAM: CT ANGIOGRAPHY CHEST WITH CONTRAST  TECHNIQUE: Multidetector CT imaging of the chest was performed using the standard protocol during bolus administration of intravenous contrast. Multiplanar CT image reconstructions and MIPs were obtained to evaluate the vascular anatomy.  CONTRAST:  31mL OMNIPAQUE IOHEXOL 350 MG/ML SOLN  COMPARISON:  Chest radiograph 01/14/2015  FINDINGS: Bones: Chronic T7 compression fractures present with 75% loss of vertebral body height anteriorly. No retropulsion or resulting stenosis. There is no paravertebral phlegmon around this fracture. No other compression fractures. No aggressive osseous lesions.  Cardiovascular: Technically adequate study for evaluation of pulmonary embolus. No pulmonary embolism is present. Coronary artery atherosclerosis.  Lungs: Dependent  atelectasis. No airspace disease. Linear scarring or subsegmental atelectasis in the RIGHT middle lobe.  Central airways: Patent.  Effusions: None.  Lymphadenopathy: No axillary adenopathy. No mediastinal or hilar adenopathy.  Esophagus: Small hiatal hernia.  Upper abdomen: Cholelithiasis.  No acute abdominal abnormality.  Other: Bilateral thyroid nodules are present, bilateral thyroid nodules are present, measuring under 15 mm in the visible portions. No further evaluation is recommended at this time. This follows ACR consensus guidelines: Managing Incidental Thyroid Nodules Detected on Imaging: White Paper of the ACR Incidental Thyroid Findings Committee. J Am Coll Radiol 2015; 12:143-150.  Review of the MIP images confirms the above findings.  IMPRESSION: 1. No acute cardiopulmonary disease. 2. Negative for pulmonary embolism. 3. Atherosclerosis and coronary artery disease. 4. Chronic T7 compression fracture.   Electronically Signed   By: Dereck Ligas M.D.   On: 01/14/2015 18:41    TKZ:SWFUX rhythm, rate 70, RBBB, QRS 148  TELEMETRY: sinus rhythm with intermittent 2:1 heart block  Assessment/Plan: 1.  Symptomatic 2:1 heart block The patient has symptomatic 2:1 heart block associated with pre-syncope.  She has no reversible causes identified.  PPM implantation is recommended.  Risks, benefits were reviewed with the patient who wishes to proceed.  Schedule will likely not allow for PPM implantation today, will schedule for tomorrow.  2.  HTN Currently stable Will need to reassess symptoms post pacemaker implantation  Dr Rayann Heman to see later today.    Signed, Chanetta Marshall, NP 01/16/2015 9:48 AM  I have seen, examined the patient, and reviewed the above assessment and plan. On exam, RRR.  Changes to above are made where necessary.   The patient has symptomatic mobitz II second degree AV block with presyncope.  No reversible causes are found.  I would therefore recommend pacemaker implantation  at this time.  Risks, benefits, alternatives to pacemaker implantation were discussed in detail with the patient today. The patient understands that the risks include but are not limited to bleeding, infection, pneumothorax, perforation, tamponade, vascular damage, renal failure, MI, stroke, death,  and lead dislodgement and wishes to proceed. We will therefore schedule the procedure at the next available time.   Co Sign: [name], MD 01/16/2015 12:37 PM

## 2015-01-16 NOTE — Consult Note (Signed)
CARDIOLOGY CONSULT NOTE  Patient ID: [name] [name] MRN: [id number] DOB/AGE: 1936-02-08 79 y.o.  Admit date: 01/14/2015 Primary Physician Henrine Screws, MD Primary Cardiologist None Chief Complaint  Dizziness, near syncope.  HPI:  The patient presents with presyncope and dizziness. She has had this ongoing since the fall. However, it has gotten progressively worse with increasing symptoms. She has a constellation of symptoms. She said at random intervals she will start to feel bad. She'll get dizzy. She'll feel like her years her clonidine and her head is under water. She sometimes has to bend over and put her head down. She curiously says that her blood pressure goes up at that time. It'll slowly resolve. She's had some episodes of near syncope but no absolute passing out. She's had hypertension that has just become labile and has required medication adjustments. Seen by Henrine Screws, MD for this. When she is not having one of her episodes she says she is very active and feels well. The patient denies any new symptoms such as chest discomfort, neck or arm discomfort. There has been no new shortness of breath, PND or orthopnea. There have been no reported palpitations.  She reports that her spells might last for minutes to hours. She might have weeks where she feels good and it doesn't happen when she might have any spells in a row. She's not had any prior cardiac workup. She did have negative enzymes here. She had an elevated d-dimer but a negative CT for pulmonary embolism. There was no suggestion of dissection. She's had an echocardiogram which was essentially unremarkable.    Past Medical History  Diagnosis Date  . Hypertension   . Broken arm Right    History reviewed. No pertinent past surgical history.  Allergies  Allergen Reactions  . Hctz [Hydrochlorothiazide] Shortness Of Breath and Other (See Comments)    Very short winded, made her hurt all over   . Losartan Other (See  Comments)    Skin crawling feelings  . Macrolides And Ketolides Other (See Comments)    Pyloric sphincter flareups-unsure of which -mycins  . Benazepril Other (See Comments)    Doesn't work for patient  . Penicillins Other (See Comments)    abd pain    Prescriptions prior to admission  Medication Sig Dispense Refill Last Dose  . amLODipine (NORVASC) 5 MG tablet Take 2.5-5 mg by mouth daily.     Marland Kitchen aspirin 325 MG tablet Take 325 mg by mouth daily.   01/14/2015 at Unknown time  . calcium carbonate (TUMS EX) 750 MG chewable tablet Chew 1 tablet by mouth daily.   01/14/2015 at Unknown time  . Cholecalciferol (VITAMIN D3) 2000 UNITS TABS Take 2,000 Units by mouth daily.   01/14/2015 at Unknown time  . Cyanocobalamin (VITAMIN B-12 PO) Take 1 tablet by mouth daily.   01/13/2015 at Unknown time  . famotidine (PEPCID AC) 10 MG chewable tablet Chew 10 mg by mouth at bedtime.   01/13/2015 at Unknown time  . fluticasone (FLONASE) 50 MCG/ACT nasal spray Place 2 sprays into both nostrils daily.   01/14/2015 at Unknown time  . Magnesium 250 MG TABS Take 250 mg by mouth daily.   01/14/2015 at Unknown time  . Multiple Vitamins-Minerals (ICAPS MV PO) Take 1 tablet by mouth daily.   01/14/2015 at Unknown time  . triamterene-hydrochlorothiazide (MAXZIDE-25) 37.5-25 MG per tablet Take 1 tablet by mouth daily.   01/14/2015 at Unknown time  . vitamin C (ASCORBIC ACID) 500 MG tablet Take  500 mg by mouth daily.   01/14/2015 at Unknown time   Family History  Problem Relation Age of Onset  . Heart failure Mother   . Hypertension Mother   . Hypertension Father   . Atrial fibrillation Father     History   Social History  . Marital Status: Married    Spouse Name: N/A  . Number of Children: N/A  . Years of Education: N/A   Occupational History  . Not on file.   Social History Main Topics  . Smoking status: Former Smoker    Quit date: 09/06/1986  . Smokeless tobacco: Not on file  . Alcohol Use: Yes     Comment:  occasionally  . Drug Use: No  . Sexual Activity: Not on file   Other Topics Concern  . Not on file   Social History Narrative     ROS:    As stated in the HPI and negative for all other systems.  Physical Exam: Blood pressure 129/57, pulse 62, temperature 98.3 F (36.8 C), temperature source Oral, resp. rate 18, height 5\' 6"  (1.676 m), weight 179 lb 1.6 oz (81.239 kg), SpO2 95 %.  GENERAL:  Well appearing HEENT:  Pupils equal round and reactive, fundi not visualized, oral mucosa unremarkable NECK:  No jugular venous distention, waveform within normal limits, carotid upstroke brisk and symmetric, no bruits, no thyromegaly LYMPHATICS:  No cervical, inguinal adenopathy LUNGS:  Clear to auscultation bilaterally BACK:  No CVA tenderness CHEST:  Unremarkable HEART:  PMI not displaced or sustained,S1 and S2 within normal limits, no S3, no S4, no clicks, no rubs, 2/6 apical systolic nonradiating systolic murmur, no diastolic murmurs ABD:  Flat, positive bowel sounds normal in frequency in pitch, no bruits, no rebound, no guarding, no midline pulsatile mass, no hepatomegaly, no splenomegaly EXT:  2 plus pulses throughout, no edema, no cyanosis no clubbing SKIN:  No rashes no nodules NEURO:  Cranial nerves II through XII grossly intact, motor grossly intact throughout PSYCH:  Cognitively intact, oriented to person place and time  Labs: Lab Results  Component Value Date   BUN 15 01/16/2015   Lab Results  Component Value Date   CREATININE 0.85 01/16/2015   Lab Results  Component Value Date   NA 141 01/16/2015   K 4.0 01/16/2015   CL 108 01/16/2015   CO2 25 01/16/2015   Lab Results  Component Value Date   TROPONINI <0.03 01/15/2015   Lab Results  Component Value Date   WBC 6.3 01/16/2015   HGB 13.8 01/16/2015   HCT 42.2 01/16/2015   MCV 90.8 01/16/2015   PLT 251 01/16/2015   Lab Results  Component Value Date   CHOL 136 01/16/2015   HDL 67 01/16/2015   LDLCALC 52  01/16/2015   TRIG 84 01/16/2015   CHOLHDL 2.0 01/16/2015   Lab Results  Component Value Date   ALT 19 01/16/2015   AST 28 01/16/2015   ALKPHOS 86 01/16/2015   BILITOT 0.7 01/16/2015    Echo: - Left ventricle: The cavity size was normal. Systolic function was normal. The estimated ejection fraction was in the range of 60% to 65%. Wall motion was normal; there were no regional wall motion abnormalities. Doppler parameters are consistent with abnormal left ventricular relaxation (grade 1 diastolic dysfunction). - Left atrium: The atrium was mildly dilated. - Atrial septum: No defect or patent foramen ovale was identified.  Radiology:   CT: IMPRESSION: 1. No acute cardiopulmonary disease. 2. Negative for pulmonary  embolism. 3. Atherosclerosis and coronary artery disease. 4. Chronic T7 compression fracture.  EKG:NSR, 2:1 heart block, RBBB, no acute ST T wave changes.  01/16/2015    ASSESSMENT AND PLAN:   NEAR SYNCOPE:  This is clearly symptomatically related to her 2:1 heart block.  I do not suspect any acute coronary syndrome or ischemia.  I did contact EP for pacemaker placement.  HTN:  This has been a new issue managed by GATES,ROBERT NEVILL, MD.  She has been sensitive to some medications.  I will continue current meds and see how she responds after pacer placement. Does the anxiety of presyncope lead to fluctuating BPs?  We will need to   CORONARY CALCIUM:  She does have coronary calcification.  However, there is no objective evidence of ischemia.  She has otherwise had a high functional level without bringing on the symptoms. I likely will screen her with a stress test in the future to further clarify this incidental finding.   SignedMinus Breeding 01/16/2015, 8:53 AM

## 2015-01-16 NOTE — Progress Notes (Signed)
RN noticed pt back in 2nd degree block II at 0740, at time pt sitting at edge of bed, c/o feeling off, slightly dizzy when turned head. EKG in bed NSR 80's. Pt back to bedside and went back into hear block 40's. EKG done-sitting. MD pgd, BP 118/64. Cards to consult. Will continue to monitor.

## 2015-01-16 NOTE — H&P (View-Only) (Signed)
ELECTROPHYSIOLOGY CONSULT NOTE    Patient ID: [name] [name] MRN: [id number], DOB/AGE: [birth year] 77 y.o.  Admit date: 01/14/2015 Date of Consult: 01/16/2015  Primary Physician: Henrine Screws, MD Primary Cardiologist: Hochrein (new this admission)  Reason for Consultation: pre-syncope/heart block  HPI:  [name] [name] is a 79 y.o. female with a past medical history significant for hypertension and pre-syncope.  She has had episodes of pre-syncope since November of 2015.  Her spells are described as dizziness that lasts for minutes and then resolve. She has not had frank syncope. She feels that her blood pressure goes up with these episodes.  She presented to the hospital on 01/14/15 with shortness of breath on exertion that has resolved. D-dimer was positive, but CTA was negative for PE.  Echocardiogram demonstrated EF 46-27%, grade 1 diastolic dysfunction, LA mildly dilated.  Lab work is otherwise unremarkable.  Telemetry has demonstrated intermittent 2:1 heart block with symptoms consistent with pre-syncopal spells.  She is on no AV nodal blocking agents.   She remains active at home but is "slow".  She denies chest pain, shortness of breath, LE edema, recent fevers, chills, nausea or vomiting.   EP has been asked to evaluate for treatment options.   Past Medical History  Diagnosis Date  . Hypertension   . Broken arm Right     Surgical History:  Past Surgical History  Procedure Laterality Date  . None       Prescriptions prior to admission  Medication Sig Dispense Refill Last Dose  . amLODipine (NORVASC) 5 MG tablet Take 2.5-5 mg by mouth daily.     Marland Kitchen aspirin 325 MG tablet Take 325 mg by mouth daily.   01/14/2015 at Unknown time  . calcium carbonate (TUMS EX) 750 MG chewable tablet Chew 1 tablet by mouth daily.   01/14/2015 at Unknown time  . Cholecalciferol (VITAMIN D3) 2000 UNITS TABS Take 2,000 Units by mouth daily.   01/14/2015 at Unknown time  . Cyanocobalamin  (VITAMIN B-12 PO) Take 1 tablet by mouth daily.   01/13/2015 at Unknown time  . famotidine (PEPCID AC) 10 MG chewable tablet Chew 10 mg by mouth at bedtime.   01/13/2015 at Unknown time  . fluticasone (FLONASE) 50 MCG/ACT nasal spray Place 2 sprays into both nostrils daily.   01/14/2015 at Unknown time  . Magnesium 250 MG TABS Take 250 mg by mouth daily.   01/14/2015 at Unknown time  . Multiple Vitamins-Minerals (ICAPS MV PO) Take 1 tablet by mouth daily.   01/14/2015 at Unknown time  . triamterene-hydrochlorothiazide (MAXZIDE-25) 37.5-25 MG per tablet Take 1 tablet by mouth daily.   01/14/2015 at Unknown time  . vitamin C (ASCORBIC ACID) 500 MG tablet Take 500 mg by mouth daily.   01/14/2015 at Unknown time    Inpatient Medications:  . amLODipine  2.5 mg Oral Daily  . enoxaparin (LOVENOX) injection  40 mg Subcutaneous Q24H  . famotidine  10 mg Oral QHS  . fluticasone  2 spray Each Nare Daily  . magnesium oxide  200 mg Oral Daily  . multivitamin-lutein  1 capsule Oral Daily  . sodium chloride  3 mL Intravenous Q12H  . vitamin C  500 mg Oral Daily    Allergies:  Allergies  Allergen Reactions  . Hctz [Hydrochlorothiazide] Shortness Of Breath and Other (See Comments)    Very short winded, made her hurt all over   . Losartan Other (See Comments)    Skin crawling feelings  . Macrolides And Ketolides  Other (See Comments)    Pyloric sphincter flareups-unsure of which -mycins  . Benazepril Other (See Comments)    Doesn't work for patient  . Penicillins Other (See Comments)    abd pain     History   Social History  . Marital Status: Married    Spouse Name: N/A  . Number of Children: 2  . Years of Education: N/A   Occupational History  . Not on file.   Social History Main Topics  . Smoking status: Former Smoker    Types: Cigarettes    Quit date: 09/06/1986  . Smokeless tobacco: Not on file  . Alcohol Use: 0.0 oz/week    0 Standard drinks or equivalent per week     Comment:  occasionally  . Drug Use: No  . Sexual Activity: Not on file   Other Topics Concern  . Not on file   Social History Narrative   Lives with Deidre Ala her husband        Family History  Problem Relation Age of Onset  . Heart failure Mother   . Hypertension Mother   . Hypertension Father   . Atrial fibrillation Father      Review of Systems: All other systems reviewed and are otherwise negative except as noted above.  Physical Exam: Filed Vitals:   01/16/15 0805 01/16/15 0815 01/16/15 0825 01/16/15 0830  BP: 118/64 134/72 153/59 129/57  Pulse:      Temp:      TempSrc:      Resp:      Height:      Weight:      SpO2:        GEN- The patient is elderly appearing, alert and oriented x 3 today.   HEENT: normocephalic, atraumatic; sclera clear, conjunctiva pink; hearing intact; oropharynx clear; neck supple, no JVP Lymph- no cervical lymphadenopathy Lungs- Clear to ausculation bilaterally, normal work of breathing.  No wheezes, rales, rhonchi Heart- Regular rate and rhythm, 2/6 systolic murmur GI- soft, non-tender, non-distended, bowel sounds present, no hepatosplenomegaly Extremities- no clubbing, cyanosis, or edema; DP/PT/radial pulses 2+ bilaterally MS- no significant deformity or atrophy Skin- warm and dry, no rash or lesion Psych- euthymic mood, full affect Neuro- strength and sensation are intact  Labs:   Lab Results  Component Value Date   WBC 6.3 01/16/2015   HGB 13.8 01/16/2015   HCT 42.2 01/16/2015   MCV 90.8 01/16/2015   PLT 251 01/16/2015    Recent Labs Lab 01/16/15 0523  NA 141  K 4.0  CL 108  CO2 25  BUN 15  CREATININE 0.85  CALCIUM 9.3  PROT 6.6  BILITOT 0.7  ALKPHOS 86  ALT 19  AST 28  GLUCOSE 90      Radiology/Studies: Dg Chest 2 View 01/14/2015   CLINICAL DATA:  79 year old female with pain medial to the left scapular for 1 day. Initial encounter.  EXAM: CHEST  2 VIEW  COMPARISON:  Right shoulder CT 04/22/2014. Lumbar radiographs  06/10/2013.  FINDINGS: Mid thoracic compression fracture with anterior wedging and moderate to severe loss of vertebral body height, age indeterminate. Osteopenia elsewhere. The lower thoracic levels appear stable and intact compared to 2014.  Large lung volumes with increased AP dimension to the chest, exaggerated kyphosis. Mild cardiomegaly. Other mediastinal contours are within normal limits. Visualized tracheal air column is within normal limits. No pneumothorax, pulmonary edema, pleural effusion or confluent pulmonary opacity. Chronic right proximal humerus fracture.  IMPRESSION: 1. Moderate to severe mid thoracic  compression fracture, age indeterminate. If pain is referable to this level and specific therapy such as vertebroplasty is desired, thoracic MRI or whole-body bone scan would best evaluate further. 2. Pulmonary hyperinflation and mild cardiomegaly. No acute cardiopulmonary abnormality.   Electronically Signed   By: Genevie Ann M.D.   On: 01/14/2015 16:12   Ct Angio Chest Pe W/cm &/or Wo Cm 01/14/2015   CLINICAL DATA:  LEFT-sided back pain with onset of symptoms at 4 a.m. today. Shortness of breath with movement.  EXAM: CT ANGIOGRAPHY CHEST WITH CONTRAST  TECHNIQUE: Multidetector CT imaging of the chest was performed using the standard protocol during bolus administration of intravenous contrast. Multiplanar CT image reconstructions and MIPs were obtained to evaluate the vascular anatomy.  CONTRAST:  24mL OMNIPAQUE IOHEXOL 350 MG/ML SOLN  COMPARISON:  Chest radiograph 01/14/2015  FINDINGS: Bones: Chronic T7 compression fractures present with 75% loss of vertebral body height anteriorly. No retropulsion or resulting stenosis. There is no paravertebral phlegmon around this fracture. No other compression fractures. No aggressive osseous lesions.  Cardiovascular: Technically adequate study for evaluation of pulmonary embolus. No pulmonary embolism is present. Coronary artery atherosclerosis.  Lungs: Dependent  atelectasis. No airspace disease. Linear scarring or subsegmental atelectasis in the RIGHT middle lobe.  Central airways: Patent.  Effusions: None.  Lymphadenopathy: No axillary adenopathy. No mediastinal or hilar adenopathy.  Esophagus: Small hiatal hernia.  Upper abdomen: Cholelithiasis.  No acute abdominal abnormality.  Other: Bilateral thyroid nodules are present, bilateral thyroid nodules are present, measuring under 15 mm in the visible portions. No further evaluation is recommended at this time. This follows ACR consensus guidelines: Managing Incidental Thyroid Nodules Detected on Imaging: White Paper of the ACR Incidental Thyroid Findings Committee. J Am Coll Radiol 2015; 12:143-150.  Review of the MIP images confirms the above findings.  IMPRESSION: 1. No acute cardiopulmonary disease. 2. Negative for pulmonary embolism. 3. Atherosclerosis and coronary artery disease. 4. Chronic T7 compression fracture.   Electronically Signed   By: Dereck Ligas M.D.   On: 01/14/2015 18:41    RKY:HCWCB rhythm, rate 70, RBBB, QRS 148  TELEMETRY: sinus rhythm with intermittent 2:1 heart block  Assessment/Plan: 1.  Symptomatic 2:1 heart block The patient has symptomatic 2:1 heart block associated with pre-syncope.  She has no reversible causes identified.  PPM implantation is recommended.  Risks, benefits were reviewed with the patient who wishes to proceed.  Schedule will likely not allow for PPM implantation today, will schedule for tomorrow.  2.  HTN Currently stable Will need to reassess symptoms post pacemaker implantation  Dr Rayann Heman to see later today.    Signed, Chanetta Marshall, NP 01/16/2015 9:48 AM  I have seen, examined the patient, and reviewed the above assessment and plan. On exam, RRR.  Changes to above are made where necessary.   The patient has symptomatic mobitz II second degree AV block with presyncope.  No reversible causes are found.  I would therefore recommend pacemaker implantation  at this time.  Risks, benefits, alternatives to pacemaker implantation were discussed in detail with the patient today. The patient understands that the risks include but are not limited to bleeding, infection, pneumothorax, perforation, tamponade, vascular damage, renal failure, MI, stroke, death,  and lead dislodgement and wishes to proceed. We will therefore schedule the procedure at the next available time.   Co Sign: [name], MD 01/16/2015 12:37 PM

## 2015-01-16 NOTE — Progress Notes (Signed)
Called by RN for assistance with patient in second degree type II.  Upon my arrival to patients room RN at bedside.  Patient sitting in bed with nasal cannula on.  Patient states she feels "off" not right.  Patient c/o pain below left shoulder 4/10.  Skin is warm and dry.  VSS 130's/50's, HR 43-76, RR 12, 96%.  EKG done prior to my arrival.  Dr. Erlinda Hong at bedside, request to move patient to SDU.  Dr. Percival Spanish at bedside.  No RRT intervention at this time. RN to call if assistance needed

## 2015-01-16 NOTE — Progress Notes (Signed)
Patient now complaining of dull ache under left shoulder.Patient stated,"ache didn't start until after I heard about irregular heart rhythm this morning.I think I'm just nervous .Can I have some aspirin?" Patient getting ready to have 12 lead ekg preformed .Will give patient some tylenol and continue to monitor.

## 2015-01-16 NOTE — Progress Notes (Signed)
Pt c/o feeling unwell to RN, currently still in mobitz II heart block and sitting again at edge of bed. Back to laying in bed but did not convert, appeared pale and SOB. Placed on O2 2L, BP stable but cycling. Charge RN and Rapid response RN called to bedside for support. MD pged and advised for transfer to step down. Will continue to monitor until transfer completed.

## 2015-01-16 NOTE — Interval H&P Note (Signed)
History and Physical Interval Note:  01/16/2015 5:51 PM  [name] [name]  has presented today for surgery, with the diagnosis of HB  The various methods of treatment have been discussed with the patient and family. After consideration of risks, benefits and other options for treatment, the patient has consented to  Procedure(s): Pacemaker Implant (N/A) as a surgical intervention .  The patient's history has been reviewed, patient examined, no change in status, stable for surgery.  I have reviewed the patient's chart and labs.  Questions were answered to the patient's satisfaction.     [name] [name]

## 2015-01-16 NOTE — Progress Notes (Signed)
PROGRESS NOTE  [name] [name] ZOX:[id number] DOB: 04/24/1936 DOA: 01/14/2015 PCP: Henrine Screws, MD  HPI/Recap of past 24 hours:  Overnight had several episode of symptomatic bradycardia, with dizziness, sob, no chest pain, bp stable.    Assessment/Plan: Principal Problem:   Near syncope Active Problems:   Dyspnea   Hypertension  Symptomatic bradycardia: tsh /mg/k wnl, not on nodal blocking agent, echo with grade 1 diastolic dysfunction, check cardiac enzymes, transfer to cardiac stepdown, cardiology consulted.  Episodic hypertension with presyncope per patient report, will get serum renin/aldosteron level/ urine metanephrines/renal artery doppler to r/o secondary HTN causes. bp well controlled here with norvasc.  DOE? WOB wnl, on room air, cxr unremarkable. CTA no PE, Echo lVEF wnl, grade 1 diastolic dysfunction. EKG sinus rhythm, chronic RBBB. Anxiety? Physical deconditioning?  Mild elevation of cr, gentle hydration, renal US pending, could be new baseline secondary to htn. Renal dosing meds. Hold diuretic for now. [place] no infection, no proteinuria.   Unintentional weight loss: tsh wnl, CTA no mass, will get FOBT for now (no anemia, no cough, no family history of cancer, former smoker quit in 1988) need outpatient cancer preventive screening.     Code Status: full  Family Communication: patient  Disposition Plan: to 2h.   Consultants:  cardiology  Procedures:  echo  Antibiotics:  none   Objective: BP 129/57 mmHg  Pulse 62  Temp(Src) 98.3 F (36.8 C) (Oral)  Resp 18  Ht 5\' 6"  (1.676 m)  Wt 81.239 kg (179 lb 1.6 oz)  BMI 28.92 kg/m2  SpO2 95%  Intake/Output Summary (Last 24 hours) at 01/16/15 0850 Last data filed at 01/16/15 0845  Gross per 24 hour  Intake 1947.5 ml  Output   1750 ml  Net  197.5 ml   Filed Weights   01/14/15 1413 01/14/15 2128 01/15/15 0612  Weight: 80.74 kg (178 lb) 81.965 kg (180 lb 11.2 oz) 81.239 kg (179 lb 1.6 oz)     Exam:   General:  NAD  Cardiovascular: RRR  Respiratory: CTABL  Abdomen: Soft/ND/NT, positive BS  Musculoskeletal: No Edema  Neuro: no focal findings  Data Reviewed: Basic Metabolic Panel:  Recent Labs Lab 01/14/15 1449 01/15/15 0835 01/16/15 0523  NA 137 139 141  K 4.0 3.8 4.0  CL 101 101 108  CO2  --  24 25  GLUCOSE 101* 89 90  BUN 17 15 15   CREATININE 0.90 1.03* 0.85  CALCIUM  --  9.8 9.3   Liver Function Tests:  Recent Labs Lab 01/15/15 0835 01/16/15 0523  AST 34 28  ALT 25 19  ALKPHOS 105 86  BILITOT 0.8 0.7  PROT 8.5* 6.6  ALBUMIN 4.1 3.5   No results for input(s): LIPASE, AMYLASE in the last 168 hours. No results for input(s): AMMONIA in the last 168 hours. CBC:  Recent Labs Lab 01/14/15 1449 01/14/15 1534 01/15/15 0835 01/16/15 0523  WBC  --  8.1 6.3 6.3  NEUTROABS  --  5.5 3.9  --   HGB 15.3* 15.2* 15.6* 13.8  HCT 45.0 46.0 47.8* 42.2  MCV  --  90.2 90.5 90.8  PLT  --  281 285 251   Cardiac Enzymes:    Recent Labs Lab 01/14/15 2221 01/15/15 0350 01/15/15 0924  TROPONINI <0.03 <0.03 <0.03   BNP (last 3 results)  Recent Labs  01/14/15 2221  BNP 17.6    ProBNP (last 3 results) No results for input(s): PROBNP in the last 8760 hours.  CBG:  Recent Labs  Lab 01/15/15 0610 01/16/15 0545  GLUCAP 85 102*    No results found for this or any previous visit (from the past 240 hour(s)).   Studies: No results found.  Scheduled Meds: . amLODipine  2.5 mg Oral Daily  . enoxaparin (LOVENOX) injection  40 mg Subcutaneous Q24H  . famotidine  10 mg Oral QHS  . fluticasone  2 spray Each Nare Daily  . magnesium oxide  200 mg Oral Daily  . multivitamin-lutein  1 capsule Oral Daily  . sodium chloride  3 mL Intravenous Q12H  . vitamin C  500 mg Oral Daily    Continuous Infusions: . sodium chloride 75 mL/hr at 01/15/15 1546     Time spent: 84mins  Dani Danis MD, PhD  [facility] Pager [phone]. If 7PM-7AM,  please contact night-coverage at [email or web address], password [facility] 01/16/2015, 8:50 AM

## 2015-01-17 ENCOUNTER — Encounter (HOSPITAL_COMMUNITY)
Admission: EM | Disposition: A | Discharge status: Home / Self Care | Payer: Self-pay | Source: Home / Self Care | Attending: Internal Medicine

## 2015-01-17 ENCOUNTER — Inpatient Hospital Stay (HOSPITAL_COMMUNITY): Payer: Medicare Other

## 2015-01-17 ENCOUNTER — Encounter (HOSPITAL_COMMUNITY): Payer: Self-pay | Admitting: Internal Medicine

## 2015-01-17 DIAGNOSIS — I441 Atrioventricular block, second degree: Principal | ICD-10-CM

## 2015-01-17 DIAGNOSIS — Z959 Presence of cardiac and vascular implant and graft, unspecified: Secondary | ICD-10-CM | POA: Insufficient documentation

## 2015-01-17 LAB — BASIC METABOLIC PANEL
Anion gap: 10 (ref 5–15)
BUN: 13 mg/dL (ref 6–20)
CALCIUM: 9.1 mg/dL (ref 8.9–10.3)
CO2: 23 mmol/L (ref 22–32)
Chloride: 105 mmol/L (ref 101–111)
Creatinine, Ser: 0.81 mg/dL (ref 0.44–1.00)
GFR calc non Af Amer: >60 mL/min (ref >60)
Glucose, Bld: 87 mg/dL (ref 65–99)
POTASSIUM: 3.7 mmol/L (ref 3.5–5.1)
SODIUM: 138 mmol/L (ref 135–145)

## 2015-01-17 LAB — GLUCOSE, CAPILLARY: GLUCOSE-CAPILLARY: 102 mg/dL — AB (ref 65–99)

## 2015-01-17 SURGERY — PACEMAKER IMPLANT
Anesthesia: LOCAL

## 2015-01-17 MED ORDER — ACETAMINOPHEN 325 MG PO TABS
325.0000 mg | ORAL_TABLET | Freq: Four times a day (QID) | ORAL | Status: DC | PRN
  Administered 2015-01-17: 325 mg via ORAL
  Filled 2015-01-17: qty 1

## 2015-01-17 MED FILL — Heparin Sodium (Porcine) 2 Unit/ML in Sodium Chloride 0.9%: INTRAMUSCULAR | Qty: 500 | Status: AC

## 2015-01-17 NOTE — Evaluation (Addendum)
Physical Therapy Evaluation Patient Details Name: [name] [name] MRN: [id number] DOB: 09/03/1936 Today's Date: 01/17/2015   History of Present Illness  [name] [name] is a 79 y.o. female with a past medical history significant for hypertension and pre-syncope. s/p pacemaker implant 01/16/15  Clinical Impression  Patient is s/p above surgery presenting with functional limitations due to the deficits listed below (see PT Problem List). Appears to have some vestibular hypofunction but difficult to recreate the "heaviness" that she feels in her head at times which she calls "spells." No dyspnea on exertion. VSS. Very guarded gait pattern and anxious with head movements. Likely would benefit from outpatient neuro PT follow up for in-depth vestibular evaluation. Safety wise, I feel she will have adequate support from husband at home and [name] [name] had no overt episodes of loss of balance or near sycope during therapy session today. Patient will benefit from skilled PT to increase their independence and safety with mobility to allow discharge to the venue listed below.       Follow Up Recommendations Outpatient PT (Outpatient Neuro Rehab for follow-up)    Equipment Recommendations  None recommended by PT    Recommendations for Other Services OT consult     Precautions / Restrictions Precautions Precautions: Fall;ICD/Pacemaker Precaution Comments: Near-sycope. Reviewed pacer precautions. Restrictions Weight Bearing Restrictions: No      Mobility  Bed Mobility               General bed mobility comments: in chair  Transfers Overall transfer level: Needs assistance Equipment used: 1 person hand held assist;None Transfers: Sit to/from Stand Sit to Stand: Min assist         General transfer comment: Performed several times from low chair with arm rests and lowest bed setting. Required intermittent min assist with hand held assist for support, other attempts did not require physical  assist. Anxious upon standing.  Ambulation/Gait Ambulation/Gait assistance: Supervision Ambulation Distance (Feet): 100 Feet Assistive device: None Gait Pattern/deviations: Step-through pattern;Drifts right/left Gait velocity: slow   General Gait Details: Very guarded with decreased rotation of hips and head. Challenged balance with head turns, vertically and horizontally which deviated patient's gait from straight path but did not lose balance requiring physical assist.  Pt very anxious initially however after performing these challenging tasks states she feels more confident. No DOE.  Stairs            Wheelchair Mobility    Modified Rankin (Stroke Patients Only)       Balance Overall balance assessment: Needs assistance Sitting-balance support: No upper extremity supported;Feet supported Sitting balance-Leahy Scale: Good     Standing balance support: No upper extremity supported Standing balance-Leahy Scale: Good       Tandem Stance - Right Leg: 0.15 Tandem Stance - Left Leg: 0.15 Rhomberg - Eyes Opened: 0.5 Rhomberg - Eyes Closed: 0.5                 Pertinent Vitals/Pain Pain Assessment: No/denies pain    Home Living Family/patient expects to be discharged to:: Private residence Living Arrangements: Spouse/significant other Available Help at Discharge: Family;Available 24 hours/day Type of Home: House Home Access: Stairs to enter Entrance Stairs-Rails: Right Entrance Stairs-Number of Steps: 3 Home Layout: Two level;Able to live on main level with bedroom/bathroom Home Equipment: [name] [name] - 2 wheels      Prior Function Level of Independence: Independent               Hand Dominance   Dominant  Hand: Right    Extremity/Trunk Assessment   Upper Extremity Assessment: Defer to OT evaluation           Lower Extremity Assessment: Overall WFL for tasks assessed         Communication   Communication: No difficulties   Cognition Arousal/Alertness: Awake/alert Behavior During Therapy: WFL for tasks assessed/performed Overall Cognitive Status: Within Functional Limits for tasks assessed                      General Comments General comments (skin integrity, edema, etc.): Reviewed pacer precautions. Assessed some vestibular components as pt stated head movements tend to make symptoms worse but not with lying down or sitting up, mostly when performing sit to stand and "when my blood pressure is high." Describes symptoms NOT as "dizziness" but a "heaviness" and calls these episodes a "spell." Reports they typically occur when she is standing and turning and last approx 2 hours before resolving. Only notable finding was a mild dymetria with visual tracking and difficulty with left eye conversion. Drinking a gallon of water, taking asprin, and sitting down to play solitare helps resolve symptoms. Today, patient had 7/10 "heaviness" when PT entered room and pt was sitting, made worse by standing, but improved to a 3/10 after performing challenging gait tasks. Symptoms appear to be worse when patient is anxious. VSS throughout session, HR in 70s, BP 150s/90s sitting, 140s/90s standing. RN reports orthostatic vitals taken just prior to PT entering room and therfore not reassessed. Time was spent with patient discussing home safety, equipment recommendations, safety with mobility, d/c planning, and recommendations for follow-up.    Exercises        Assessment/Plan    PT Assessment Patient needs continued PT services  PT Diagnosis Abnormality of gait;Other (comment) (Dizziness)   PT Problem List Decreased activity tolerance;Decreased balance;Decreased mobility;Decreased knowledge of use of DME;Cardiopulmonary status limiting activity  PT Treatment Interventions DME instruction;Gait training;Stair training;Functional mobility training;Therapeutic activities;Balance training;Therapeutic exercise;Patient/family  education   PT Goals (Current goals can be found in the Care Plan section) Acute Rehab PT Goals Patient Stated Goal: No more spells PT Goal Formulation: All assessment and education complete, DC therapy    Frequency Min 3X/week   Barriers to discharge        Co-evaluation               End of Session   Activity Tolerance: Patient tolerated treatment well Patient left: in chair;with call bell/phone within reach;with family/visitor present Nurse Communication: Mobility status         Time: 5329-9242 PT Time Calculation (min) (ACUTE ONLY): 58 min   Charges:   PT Evaluation $Initial PT Evaluation Tier I: 1 Procedure PT Treatments $Gait Training: 8-22 mins $Therapeutic Activity: 8-22 mins $Self Care/Home Management: 8-22   PT G Codes:        Ellouise Newer 01/17/2015, 6:16 PM Camille Bal [place], [place]

## 2015-01-17 NOTE — Progress Notes (Signed)
PROGRESS NOTE  [name] [name] QHU:[id number] DOB: 1936-01-09 DOA: 01/14/2015 PCP: Henrine Screws, MD  HPI/Recap of past 24 hours:  S/p pacemaker placement, feeling better, denies sob, no chest pain, bp stable. Reported generalized weakness, feeling dizzy standing up.    Assessment/Plan: Principal Problem:   Near syncope Active Problems:   Dyspnea   Hypertension   Second degree Mobitz II AV block  Symptomatic bradycardia: tsh /mg/k wnl, not on nodal blocking agent, echo with grade 1 diastolic dysfunction, negative cardiac enzymes, transfer to cardiac stepdown, cardiology consulted, s/p pacemaker placement on 5/12. Improved, transfer to tele.  Episodic hypertension with presyncope per patient report, serum renin/aldosteron level/ urine metanephrines/renal artery doppler to r/o secondary HTN causes. bp well controlled here with norvasc.  DOE? WOB wnl, on room air, cxr unremarkable. CTA no PE, Echo lVEF wnl, grade 1 diastolic dysfunction. EKG sinus rhythm, chronic RBBB. Anxiety? Physical deconditioning? Physical therapy eval.  Mild elevation of cr, resolved after gentle hydration. continue Hold diuretics. [place] no infection, no proteinuria.  Unintentional weight loss: tsh wnl, CTA no mass, will get FOBT for now (no anemia, no cough, no family history of cancer, former smoker quit in 1988) need outpatient cancer preventive screening.  Weakness: Pt eval, check orthostatic vital sign.   Code Status: full  Family Communication: patient  Disposition Plan: to tele, likely d/c in am.   Consultants:  cardiology  Procedures:  Echo  Pacemaker placement  Antibiotics:  none   Objective: BP 137/85 mmHg  Pulse 65  Temp(Src) 97.9 F (36.6 C) (Oral)  Resp 17  Ht 5\' 6"  (1.676 m)  Wt 81 kg (178 lb 9.2 oz)  BMI 28.84 kg/m2  SpO2 98%  Intake/Output Summary (Last 24 hours) at 01/17/15 1534 Last data filed at 01/17/15 1300  Gross per 24 hour  Intake   1360 ml  Output     950 ml  Net    410 ml   Filed Weights   01/14/15 2128 01/15/15 0612 01/17/15 0530  Weight: 81.965 kg (180 lb 11.2 oz) 81.239 kg (179 lb 1.6 oz) 81 kg (178 lb 9.2 oz)    Exam:   General:  NAD  Cardiovascular: paced rhythm  Respiratory: CTABL  Abdomen: Soft/ND/NT, positive BS  Musculoskeletal: No Edema  Neuro: no focal findings  Data Reviewed: Basic Metabolic Panel:  Recent Labs Lab 01/14/15 1449 01/15/15 0835 01/16/15 0523 01/17/15 0240  NA 137 139 141 138  K 4.0 3.8 4.0 3.7  CL 101 101 108 105  CO2  --  24 25 23   GLUCOSE 101* 89 90 87  BUN 17 15 15 13   CREATININE 0.90 1.03* 0.85 0.81  CALCIUM  --  9.8 9.3 9.1  MG  --   --  2.1  --    Liver Function Tests:  Recent Labs Lab 01/15/15 0835 01/16/15 0523  AST 34 28  ALT 25 19  ALKPHOS 105 86  BILITOT 0.8 0.7  PROT 8.5* 6.6  ALBUMIN 4.1 3.5   No results for input(s): LIPASE, AMYLASE in the last 168 hours. No results for input(s): AMMONIA in the last 168 hours. CBC:  Recent Labs Lab 01/14/15 1449 01/14/15 1534 01/15/15 0835 01/16/15 0523  WBC  --  8.1 6.3 6.3  NEUTROABS  --  5.5 3.9  --   HGB 15.3* 15.2* 15.6* 13.8  HCT 45.0 46.0 47.8* 42.2  MCV  --  90.2 90.5 90.8  PLT  --  281 285 251   Cardiac Enzymes:  Recent Labs Lab 01/14/15 2221 01/15/15 0350 01/15/15 0924 01/16/15 0940 01/16/15 1618  TROPONINI <0.03 <0.03 <0.03 <0.03 <0.03   BNP (last 3 results)  Recent Labs  01/14/15 2221 01/16/15 0940  BNP 17.6 22.4    ProBNP (last 3 results) No results for input(s): PROBNP in the last 8760 hours.  CBG:  Recent Labs Lab 01/15/15 0610 01/16/15 0545 01/17/15 0812  GLUCAP 85 102* 102*    Recent Results (from the past 240 hour(s))  MRSA PCR Screening     Status: None   Collection Time: 01/16/15 11:14 AM  Result Value Ref Range Status   MRSA by PCR NEGATIVE NEGATIVE Final    Comment:        The GeneXpert MRSA Assay (FDA approved for NASAL specimens only), is one component  of a comprehensive MRSA colonization surveillance program. It is not intended to diagnose MRSA infection nor to guide or monitor treatment for MRSA infections.      Studies: No results found.  Scheduled Meds: . amLODipine  2.5 mg Oral Daily  . famotidine  10 mg Oral QHS  . fluticasone  2 spray Each Nare Daily  . magnesium oxide  200 mg Oral Daily  . multivitamin-lutein  1 capsule Oral Daily  . sodium chloride  3 mL Intravenous Q12H  . vitamin C  500 mg Oral Daily    Continuous Infusions:     Time spent: 68mins  Eaton Folmar MD, PhD  [facility] Pager [phone]. If 7PM-7AM, please contact night-coverage at [email or web address], password [facility] 01/17/2015, 3:34 PM  LOS: 1 day

## 2015-01-17 NOTE — Progress Notes (Signed)
SUBJECTIVE: The patient is doing well today.  At this time, she denies chest pain, shortness of breath, or any new concerns.  CURRENT MEDICATIONS: . amLODipine  2.5 mg Oral Daily  . famotidine  10 mg Oral QHS  . fluticasone  2 spray Each Nare Daily  . magnesium oxide  200 mg Oral Daily  . multivitamin-lutein  1 capsule Oral Daily  . sodium chloride  3 mL Intravenous Q12H  . vitamin C  500 mg Oral Daily      OBJECTIVE: Physical Exam: Filed Vitals:   01/17/15 0200 01/17/15 0400 01/17/15 0530 01/17/15 0630  BP: 107/50 93/43    Pulse: 58 62 64 63  Temp:  98.4 F (36.9 C)    TempSrc:  Oral    Resp: 15 16 15 15   Height:      Weight:   178 lb 9.2 oz (81 kg)   SpO2: 97% 96% 98% 98%    Intake/Output Summary (Last 24 hours) at 01/17/15 0721 Last data filed at 01/17/15 0600  Gross per 24 hour  Intake   1200 ml  Output   2750 ml  Net  -1550 ml    Telemetry reveals sinus rhythm with intermittent ventricular pacing  GEN- The patient is well appearing, alert and oriented x 3 today.   Head- normocephalic, atraumatic Eyes-  Sclera clear, conjunctiva pink Ears- hearing intact Oropharynx- clear Neck- supple, no JVP Lymph- no cervical lymphadenopathy Lungs- Clear to ausculation bilaterally, normal work of breathing Heart- Regular rate and rhythm, no murmurs, rubs or gallops  GI- soft, NT, ND, + BS Extremities- no clubbing, cyanosis, or edema, left chest without hematoma/ecchymosis Skin- no rash or lesion Psych- euthymic mood, full affect Neuro- strength and sensation are intact  LABS: Basic Metabolic Panel:  Recent Labs  01/16/15 0523 01/17/15 0240  NA 141 138  K 4.0 3.7  CL 108 105  CO2 25 23  GLUCOSE 90 87  BUN 15 13  CREATININE 0.85 0.81  CALCIUM 9.3 9.1  MG 2.1  --    Liver Function Tests:  Recent Labs  01/15/15 0835 01/16/15 0523  AST 34 28  ALT 25 19  ALKPHOS 105 86  BILITOT 0.8 0.7  PROT 8.5* 6.6  ALBUMIN 4.1 3.5    CBC:  Recent Labs  01/14/15 1534 01/15/15 0835 01/16/15 0523  WBC 8.1 6.3 6.3  NEUTROABS 5.5 3.9  --   HGB 15.2* 15.6* 13.8  HCT 46.0 47.8* 42.2  MCV 90.2 90.5 90.8  PLT 281 285 251   Cardiac Enzymes:  Recent Labs  01/15/15 0924 01/16/15 0940 01/16/15 1618  TROPONINI <0.03 <0.03 <0.03   D-Dimer:  Recent Labs  01/14/15 1534  DDIMER 2.17*   Hemoglobin A1C:  Recent Labs  01/14/15 2221  HGBA1C 5.7*   Fasting Lipid Panel:  Recent Labs  01/16/15 0523  CHOL 136  HDL 67  LDLCALC 52  TRIG 84  CHOLHDL 2.0   Thyroid Function Tests:  Recent Labs  01/14/15 2221  TSH 2.274    RADIOLOGY: Dg Chest 2 View 01/14/2015   CLINICAL DATA:  79 year old female with pain medial to the left scapular for 1 day. Initial encounter.  EXAM: CHEST  2 VIEW  COMPARISON:  Right shoulder CT 04/22/2014. Lumbar radiographs 06/10/2013.  FINDINGS: Mid thoracic compression fracture with anterior wedging and moderate to severe loss of vertebral body height, age indeterminate. Osteopenia elsewhere. The lower thoracic levels appear stable and intact compared to 2014.  Large lung volumes  with increased AP dimension to the chest, exaggerated kyphosis. Mild cardiomegaly. Other mediastinal contours are within normal limits. Visualized tracheal air column is within normal limits. No pneumothorax, pulmonary edema, pleural effusion or confluent pulmonary opacity. Chronic right proximal humerus fracture.  IMPRESSION: 1. Moderate to severe mid thoracic compression fracture, age indeterminate. If pain is referable to this level and specific therapy such as vertebroplasty is desired, thoracic MRI or whole-body bone scan would best evaluate further. 2. Pulmonary hyperinflation and mild cardiomegaly. No acute cardiopulmonary abnormality.   Electronically Signed   By: Genevie Ann M.D.   On: 01/14/2015 16:12    ASSESSMENT AND PLAN:  Principal Problem:   Near syncope Active Problems:   Dyspnea   Hypertension   Second degree Mobitz II AV  block  1.  Symptomatic Mobitz II heart block S/p MDT dual chamber pacemaker implantation 01/16/15 Normal device function this morning - see paper chart CXR this morning with leads in stable position, no ptx Wound care, restrictions reviewed with patient  2.  HTN Stable No change required today  Ok from an EP standpoint to discharge home later today. Instructions and appointments entered into AVS.  Chanetta Marshall, NP 01/17/2015 7:23 AM  I have seen, examined the patient, and reviewed the above assessment and plan.  Device interrogation is reviewed and normal.  Changes to above are made where necessary.    Co Sign: [name], MD 01/17/2015

## 2015-01-18 ENCOUNTER — Inpatient Hospital Stay (HOSPITAL_COMMUNITY): Payer: Medicare Other

## 2015-01-18 DIAGNOSIS — I10 Essential (primary) hypertension: Secondary | ICD-10-CM

## 2015-01-18 DIAGNOSIS — Z959 Presence of cardiac and vascular implant and graft, unspecified: Secondary | ICD-10-CM

## 2015-01-18 LAB — GLUCOSE, CAPILLARY: Glucose-Capillary: 93 mg/dL (ref 65–99)

## 2015-01-18 LAB — METANEPHRINES, URINE, 24 HOUR
METANEPH TOTAL UR: 41 ug/L
METANEPHRINES 24H UR: 123 ug/(24.h) (ref 45–290)
NORMETANEPHRINE UR: 99 ug/L
Normetanephrine, 24H Ur: 297 ug/24 hr (ref 82–500)
Total Volume: 3000

## 2015-01-18 MED ORDER — AMLODIPINE BESYLATE 2.5 MG PO TABS
2.5000 mg | ORAL_TABLET | Freq: Once | ORAL | Status: AC
  Administered 2015-01-18: 2.5 mg via ORAL
  Filled 2015-01-18: qty 1

## 2015-01-18 MED ORDER — TRIAMTERENE-HCTZ 37.5-25 MG PO TABS
1.0000 | ORAL_TABLET | Freq: Every day | ORAL | Status: DC
  Administered 2015-01-18: 1 via ORAL
  Filled 2015-01-18: qty 1

## 2015-01-18 MED ORDER — AMLODIPINE BESYLATE 5 MG PO TABS: 5.0000 mg | ORAL_TABLET | Freq: Every day | ORAL | Status: DC

## 2015-01-18 MED ORDER — AMLODIPINE BESYLATE 2.5 MG PO TABS: 2.5000 mg | ORAL_TABLET | Freq: Every day | ORAL | Status: DC

## 2015-01-18 NOTE — Discharge Summary (Addendum)
Discharge Summary  [name] [name] AST:[id number] DOB: Dec 06, 1935  PCP: Henrine Screws, MD  Admit date: 01/14/2015 Discharge date: 01/18/2015  Time spent: <51mins  Recommendations for Outpatient Follow-up:  1. F/u with PMD in one week, pmd to repeat bmp, monitor cr level. pmd to follow up on serum renin/aldosteron level/ urine metanephrines/renal artery doppler result. 2. Cardiology follow up, s/p pacemaker placement. 3. Outpatient neuro rehab PT for balance training.  Discharge Diagnoses:  Active Hospital Problems   Diagnosis Date Noted  . Near syncope 01/14/2015  . Cardiac device in situ   . Second degree Mobitz II AV block   . Hypertension   . Dyspnea 01/14/2015    Resolved Hospital Problems   Diagnosis Date Noted Date Resolved  No resolved problems to display.    Discharge Condition: stable  Diet recommendation: heart health  Filed Weights   01/15/15 0612 01/17/15 0530 01/18/15 0414  Weight: 81.239 kg (179 lb 1.6 oz) 81 kg (178 lb 9.2 oz) 81.375 kg (179 lb 6.4 oz)    History of present illness:  [name] [name] is a 79 y.o. female with Past medical history of hypertension. Patient presents with complaints of shortness of breath on exertion as well as near syncope. She mentions that while she was talking with her friends today she showed only filed that she was given a pass out and she had to sit down after sitting down she checked her blood pressure and it was elevated at 989 systolic and therefore she came to the hospital as she was not feeling good. She mentions that she has been started on blood pressure medication recently in the last 6 months and has been there having difficulty maintaining her blood pressure. Her blood pressure but fluctuated throughout the day from 90 systolic to 211 systolic. She also report episodic bp elevation some time to 200's. She checks her blood pressure multiple times during the day as she feels that she is going to pass out. She  mentions this episodes of near passing out happens once every  week. She also feels palpitation with that. She had nausea but no vomiting today. She has some diaphoresis as well today. She denies any chest pain chest heaviness chest tightness fever or chills. She mentions she has lost some weights. She has leg swelling. She denies any diarrhea or constipation denies any burning urination.  The patient is coming from home. And at her baseline independent for most of her ADL.   Hospital Course:  Principal Problem:   Near syncope Active Problems:   Dyspnea   Hypertension   Second degree Mobitz II AV block   Cardiac device in situ  Symptomatic bradycardia (2nd degree mobitz II AV block): tsh /mg/k wnl, not on nodal blocking agent, echo with grade 1 diastolic dysfunction, negative cardiac enzymes, transfer to cardiac stepdown, cardiology consulted, s/p pacemaker placement on 5/12. Improved.  Episodic hypertension with presyncope per patient report, serum renin/aldosteron level/ urine metanephrines/renal artery doppler to r/o secondary HTN causes. Result pending, pmd to follow up on final result. resumed norvasc and maxzide at discharge.  DOE? WOB wnl, on room air, cxr unremarkable. CTA no PE, Echo lVEF wnl, grade 1 diastolic dysfunction. EKG sinus rhythm, chronic RBBB. Anxiety? Physical deconditioning? Physical therapy eval.  Mild elevation of cr, resolved after gentle hydration. continue Hold diuretics. [place] no infection, no proteinuria. Diuretics resumed at discharge.  Unintentional weight loss: tsh wnl, CTA no mass, will get FOBT for now (no anemia, no cough, no family history of  cancer, former smoker quit in 1988) need outpatient cancer preventive screening.  Weakness: negative orthostatic vital sign. PT eval recommended outpatient neuro rehab for balance training, care manager consulted.   Code Status: full  Family Communication: patient  Disposition Plan: d/c home on  5/14   Consultants:  cardiology  Procedures:  Echo  Pacemaker placement 01/16/2015  Antibiotics:  none  Discharge Exam: BP 157/75 mmHg  Pulse 72  Temp(Src) 98.6 F (37 C) (Oral)  Resp 17  Ht 5\' 6"  (1.676 m)  Wt 81.375 kg (179 lb 6.4 oz)  BMI 28.97 kg/m2  SpO2 100%  General: AAOx3 Cardiovascular: paced rhythm Respiratory: CTABL  Discharge Instructions You were cared for by a hospitalist during your hospital stay. If you have any questions about your discharge medications or the care you received while you were in the hospital after you are discharged, you can call the unit and asked to speak with the hospitalist on call if the hospitalist that took care of you is not available. Once you are discharged, your primary care physician will handle any further medical issues. Please note that NO REFILLS for any discharge medications will be authorized once you are discharged, as it is imperative that you return to your primary care physician (or establish a relationship with a primary care physician if you do not have one) for your aftercare needs so that they can reassess your need for medications and monitor your lab values.  Discharge Instructions    Diet - low sodium heart healthy    Complete by:  As directed      Increase activity slowly    Complete by:  As directed             Medication List    TAKE these medications        amLODipine 5 MG tablet  Commonly known as:  NORVASC  Take 1 tablet (5 mg total) by mouth daily.     aspirin 325 MG tablet  Take 325 mg by mouth daily.     calcium carbonate 750 MG chewable tablet  Commonly known as:  TUMS EX  Chew 1 tablet by mouth daily.     famotidine 10 MG chewable tablet  Commonly known as:  PEPCID AC  Chew 10 mg by mouth at bedtime.     fluticasone 50 MCG/ACT nasal spray  Commonly known as:  FLONASE  Place 2 sprays into both nostrils daily.     ICAPS MV PO  Take 1 tablet by mouth daily.     Magnesium 250 MG  Tabs  Take 250 mg by mouth daily.     triamterene-hydrochlorothiazide 37.5-25 MG per tablet  Commonly known as:  MAXZIDE-25  Take 1 tablet by mouth daily.     VITAMIN B-12 PO  Take 1 tablet by mouth daily.     vitamin C 500 MG tablet  Commonly known as:  ASCORBIC ACID  Take 500 mg by mouth daily.     Vitamin D3 2000 UNITS Tabs  Take 2,000 Units by mouth daily.       Allergies  Allergen Reactions  . Hctz [Hydrochlorothiazide] Shortness Of Breath and Other (See Comments)    Very short winded, made her hurt all over   . Losartan Other (See Comments)    Skin crawling feelings  . Macrolides And Ketolides Other (See Comments)    Pyloric sphincter flareups-unsure of which -mycins  . Benazepril Other (See Comments)    Doesn't work for patient  .  Penicillins Other (See Comments)    abd pain        Follow-up Information    Follow up with [facility] On 01/27/2015.   Why:  at [facility] information:   [address] [place] [id number]       Follow up with Henrine Screws, MD In 1 week.   Specialty:  Internal Medicine   Why:  hospital discharge follow up.   Contact information:   [address] [place] [address] [phone]        The results of significant diagnostics from this hospitalization (including imaging, microbiology, ancillary and laboratory) are listed below for reference.    Significant Diagnostic Studies: Dg Chest 2 View  01/17/2015   CLINICAL DATA:  Cardiac device in situ  EXAM: CHEST  2 VIEW  COMPARISON:  01/14/2015  FINDINGS: Cardiomediastinal silhouette is stable. Again noted compression deformity mid thoracic spine stable. There is stable chronic fracture deformity of right humeral head. There is a dual lead cardiac pacemaker with left subclavian approach with leads in right atrium and right ventricle. No pneumothorax. No acute infiltrate or pulmonary edema.  IMPRESSION: No active disease.  Dual lead cardiac pacemaker in place. No pneumothorax.   Electronically Signed   By: Lahoma Crocker M.D.   On: 01/17/2015 07:56   Dg Chest 2 View  01/14/2015   CLINICAL DATA:  79 year old female with pain medial to the left scapular for 1 day. Initial encounter.  EXAM: CHEST  2 VIEW  COMPARISON:  Right shoulder CT 04/22/2014. Lumbar radiographs 06/10/2013.  FINDINGS: Mid thoracic compression fracture with anterior wedging and moderate to severe loss of vertebral body height, age indeterminate. Osteopenia elsewhere. The lower thoracic levels appear stable and intact compared to 2014.  Large lung volumes with increased AP dimension to the chest, exaggerated kyphosis. Mild cardiomegaly. Other mediastinal contours are within normal limits. Visualized tracheal air column is within normal limits. No pneumothorax, pulmonary edema, pleural effusion or confluent pulmonary opacity. Chronic right proximal humerus fracture.  IMPRESSION: 1. Moderate to severe mid thoracic compression fracture, age indeterminate. If pain is referable to this level and specific therapy such as vertebroplasty is desired, thoracic MRI or whole-body bone scan would best evaluate further. 2. Pulmonary hyperinflation and mild cardiomegaly. No acute cardiopulmonary abnormality.   Electronically Signed   By: Genevie Ann M.D.   On: 01/14/2015 16:12   Ct Angio Chest Pe W/cm &/or Wo Cm  01/14/2015   CLINICAL DATA:  LEFT-sided back pain with onset of symptoms at 4 a.m. today. Shortness of breath with movement.  EXAM: CT ANGIOGRAPHY CHEST WITH CONTRAST  TECHNIQUE: Multidetector CT imaging of the chest was performed using the standard protocol during bolus administration of intravenous contrast. Multiplanar CT image reconstructions and MIPs were obtained to evaluate the vascular anatomy.  CONTRAST:  34mL OMNIPAQUE IOHEXOL 350 MG/ML SOLN  COMPARISON:  Chest radiograph 01/14/2015  FINDINGS: Bones: Chronic T7 compression fractures present with 75% loss of vertebral  body height anteriorly. No retropulsion or resulting stenosis. There is no paravertebral phlegmon around this fracture. No other compression fractures. No aggressive osseous lesions.  Cardiovascular: Technically adequate study for evaluation of pulmonary embolus. No pulmonary embolism is present. Coronary artery atherosclerosis.  Lungs: Dependent atelectasis. No airspace disease. Linear scarring or subsegmental atelectasis in the RIGHT middle lobe.  Central airways: Patent.  Effusions: None.  Lymphadenopathy: No axillary adenopathy. No mediastinal or hilar adenopathy.  Esophagus: Small hiatal  hernia.  Upper abdomen: Cholelithiasis.  No acute abdominal abnormality.  Other: Bilateral thyroid nodules are present, bilateral thyroid nodules are present, measuring under 15 mm in the visible portions. No further evaluation is recommended at this time. This follows ACR consensus guidelines: Managing Incidental Thyroid Nodules Detected on Imaging: White Paper of the ACR Incidental Thyroid Findings Committee. J Am Coll Radiol 2015; 12:143-150.  Review of the MIP images confirms the above findings.  IMPRESSION: 1. No acute cardiopulmonary disease. 2. Negative for pulmonary embolism. 3. Atherosclerosis and coronary artery disease. 4. Chronic T7 compression fracture.   Electronically Signed   By: Dereck Ligas M.D.   On: 01/14/2015 18:41    Microbiology: Recent Results (from the past 240 hour(s))  MRSA PCR Screening     Status: None   Collection Time: 01/16/15 11:14 AM  Result Value Ref Range Status   MRSA by PCR NEGATIVE NEGATIVE Final    Comment:        The GeneXpert MRSA Assay (FDA approved for NASAL specimens only), is one component of a comprehensive MRSA colonization surveillance program. It is not intended to diagnose MRSA infection nor to guide or monitor treatment for MRSA infections.      Labs: Basic Metabolic Panel:  Recent Labs Lab 01/14/15 1449 01/15/15 0835 01/16/15 0523  01/17/15 0240  NA 137 139 141 138  K 4.0 3.8 4.0 3.7  CL 101 101 108 105  CO2  --  24 25 23   GLUCOSE 101* 89 90 87  BUN 17 15 15 13   CREATININE 0.90 1.03* 0.85 0.81  CALCIUM  --  9.8 9.3 9.1  MG  --   --  2.1  --    Liver Function Tests:  Recent Labs Lab 01/15/15 0835 01/16/15 0523  AST 34 28  ALT 25 19  ALKPHOS 105 86  BILITOT 0.8 0.7  PROT 8.5* 6.6  ALBUMIN 4.1 3.5   No results for input(s): LIPASE, AMYLASE in the last 168 hours. No results for input(s): AMMONIA in the last 168 hours. CBC:  Recent Labs Lab 01/14/15 1449 01/14/15 1534 01/15/15 0835 01/16/15 0523  WBC  --  8.1 6.3 6.3  NEUTROABS  --  5.5 3.9  --   HGB 15.3* 15.2* 15.6* 13.8  HCT 45.0 46.0 47.8* 42.2  MCV  --  90.2 90.5 90.8  PLT  --  281 285 251   Cardiac Enzymes:  Recent Labs Lab 01/14/15 2221 01/15/15 0350 01/15/15 0924 01/16/15 0940 01/16/15 1618  TROPONINI <0.03 <0.03 <0.03 <0.03 <0.03   BNP: BNP (last 3 results)  Recent Labs  01/14/15 2221 01/16/15 0940  BNP 17.6 22.4    ProBNP (last 3 results) No results for input(s): PROBNP in the last 8760 hours.  CBG:  Recent Labs Lab 01/15/15 0610 01/16/15 0545 01/17/15 0812 01/18/15 0807  GLUCAP 85 102* 102* 93       Signed:  Weyman Bogdon MD, PhD  [facility] 01/18/2015, 8:52 AM

## 2015-01-18 NOTE — Discharge Instructions (Signed)
Supplemental Discharge Instructions for  Pacemaker/Defibrillator Patients  Activity No heavy lifting or vigorous activity with your left/right arm for 6 to 8 weeks.  Do not raise your left/right arm above your head for one week.  Gradually raise your affected arm as drawn below.           __        01/21/15                   01/22/15                  01/23/15                   01/24/15  NO DRIVING for  1 week   ; you may begin driving on 2/35/36    .  WOUND CARE - Keep the wound area clean and dry.  Do not get this area wet for one week. No showers for one week; you may shower on   01/24/15  . - The tape/steri-strips on your wound will fall off; do not pull them off.  No bandage is needed on the site.  DO  NOT apply any creams, oils, or ointments to the wound area. - If you notice any drainage or discharge from the wound, any swelling or bruising at the site, or you develop a fever > 101? F after you are discharged home, call the office at once.  Special Instructions - You are still able to use cellular telephones; use the ear opposite the side where you have your pacemaker/defibrillator.  Avoid carrying your cellular phone near your device. - When traveling through airports, show security personnel your identification card to avoid being screened in the metal detectors.  Ask the security personnel to use the hand wand. - Avoid arc welding equipment, MRI testing (magnetic resonance imaging), TENS units (transcutaneous nerve stimulators).  Call the office for questions about other devices. - Avoid electrical appliances that are in poor condition or are not properly grounded. - Microwave ovens are safe to be near or to operate.  Cardiac Diet A cardiac diet can help stop heart disease or a stroke from happening. It involves eating less unhealthy fats and eating more healthy fats.  FOODS TO AVOID OR LIMIT  Limit saturated fats. This type of fat is found in oils and dairy products, such  as:  Coconut oil.  Palm oil.  Cocoa butter.  Butter.  Avoid trans-fat or hydrogenated oils. These are found in fried or pre-made baked goods, such as:  Margarine.  Pre-made cookies, cakes, and crackers.  Limit processed meats (hot dogs, deli meats, sausage) to 3 ounces a week.  Limit high-fat meats (marbled meats, fried chicken, or chicken with skin) to 3 ounces a week.  Limit salt (sodium) to 1500 milligrams a day.   Limit sweets and drinks with added sugar to no more than 5 servings a week. One serving is:  1 tablespoon of sugar.  1 tablespoon of jelly or jam.   cup sorbet.  1 cup lemonade.   cup regular soda. EAT MORE OF THE FOLLOWING FOODS Fruit  Eat 4to 5 servings a day. One serving of fruit is:  1 medium whole fruit.   cup dried fruit.   cup of fresh, frozen, or canned fruit.   cup 100% fruit juice. Vegetables  Eat 4 to 5 servings a day. One serving is:  1 cup raw leafy vegetables.   cup raw or cooked,  cut-up vegetables.   cup vegetable juice. Whole Grains  Eat 3 servings a day (1 ounce equals 1 serving). Legumes (such as beans, peas, and lentils)   Eat at least 4 servings a week ( cup equals 1 serving). Nuts and Seeds   Eat at least 4 servings a week ( cup equals 1 serving). Dietary Fiber  Eat 20 to 30 grams a day. Some foods high in dietary fiber include:  Dried beans.  Citrus fruits.  Apples, bananas.  Broccoli, Brussels sprouts, and eggplant.  Oats. Omega-3 Fats  Eat food with omega-3 fats. You can also take a dietary pill (supplement) that has 1 gram of DHA and EPA. Have 3.5 ounces of fatty fish a week, such as:  Salmon.  Mackerel.  [place]acore tuna.  Sardines.  Lake trout.  Herring. PREPARING YOUR FOOD  Broil, bake, steam, or roast foods. Do not fry food. Do not cook food in butter (fat).  Use non-stick cooking sprays.  Remove skin from poultry, such as chicken and [place].  Remove fat from  meat.  Take the fat off the top of stews, soups, and gravy.  Use lemon or herbs to flavor food instead of using butter or margarine.  Use nonfat yogurt, salsa, or low-fat dressings for salads. Document Released: 02/22/2012 Document Reviewed: 02/22/2012 [facility] Patient Information 2015 [place]. This information is not intended to replace advice given to you by your health care provider. Make sure you discuss any questions you have with your health care provider.

## 2015-01-18 NOTE — Progress Notes (Signed)
*  PRELIMINARY RESULTS* Vascular Ultrasound Renal Artery Duplex has been completed.  Preliminary findings: No evidence of renal artery stenosis. Left distal renal artery demonstrates borderline increased velocities (165/37 cm/s) but is still within normal range. Bilaterally elevated intrarenal resistive indices.  Atypical waveforms noted in bilateral renal arteries and intrarenal flow, with lowered diastolic flow. Etiology unknown.   Landry Mellow, RDMS, RVT  01/18/2015, 9:10 AM

## 2015-01-18 NOTE — Progress Notes (Signed)
PT Cancellation Note  Patient Details Name: [name] [name] MRN: [id number] DOB: 26-Feb-1936   Cancelled Treatment:    Reason Eval/Treat Not Completed: Medical issues which prohibited therapy (Pt reports high BP and doesn't feel up to amb). Will check back as time allows.   [name] 01/18/2015, 9:58 AM  [name] PT [phone]

## 2015-01-20 NOTE — Care Management (Signed)
1000 01-20-15 Referral sent via EPIC for Outpatient Physical Therapy. Bethena Roys, RN,BSN Case Manager.

## 2015-01-21 ENCOUNTER — Telehealth: Payer: Self-pay | Admitting: Internal Medicine

## 2015-01-21 ENCOUNTER — Ambulatory Visit: Payer: Medicare Other | Admitting: *Deleted

## 2015-01-21 VITALS — BP 127/81 | HR 82

## 2015-01-21 DIAGNOSIS — T82837A Hemorrhage of cardiac prosthetic devices, implants and grafts, initial encounter: Secondary | ICD-10-CM

## 2015-01-21 LAB — CUP PACEART MISC DEVICE CHECK: Date Time Interrogation Session: 20160517134631

## 2015-01-21 LAB — ALDOSTERONE + RENIN ACTIVITY W/ RATIO
ALDO / PRA Ratio: UNDETERMINED
Aldosterone: 15.8 ng/dL (ref 0.0–30.0)
PRA LC/MS/MS: UNDETERMINED ng/mL/hr

## 2015-01-21 NOTE — Telephone Encounter (Signed)
New message       1. Has your device fired? no 2. Is you device beeping? no 3. Are you experiencing draining or swelling at device site?  Goose egg size swelling  4. Are you calling to see if we received your device transmission? no 5. Have you passed out? no

## 2015-01-21 NOTE — Telephone Encounter (Signed)
Pt agreed to appt today at 1:00 PM.

## 2015-01-21 NOTE — Progress Notes (Signed)
Patient presents today with site swelling. C/O "wooziness" and feeling flushed. She is highly anxious. Patient has BP log- today's readings WNL. Device interrogated- no mode switches or ventricular high rates. Ap 9.5%, Vp 36%. Explained normal device function to patient. GT to room to assess PPM site- minor swelling, site soft with some pain upon palpation. Pt instructed to continue her medication regimen and activity restrictions with left arm. She will return to device clinic on Monday 01/27/15 for wound check.

## 2015-01-27 ENCOUNTER — Ambulatory Visit (INDEPENDENT_AMBULATORY_CARE_PROVIDER_SITE_OTHER): Payer: Medicare Other | Admitting: Nurse Practitioner

## 2015-01-27 ENCOUNTER — Encounter: Payer: Self-pay | Admitting: Nurse Practitioner

## 2015-01-27 ENCOUNTER — Ambulatory Visit: Payer: Medicare Other | Admitting: Nurse Practitioner

## 2015-01-27 ENCOUNTER — Ambulatory Visit (INDEPENDENT_AMBULATORY_CARE_PROVIDER_SITE_OTHER): Payer: Medicare Other | Admitting: *Deleted

## 2015-01-27 VITALS — BP 120/90 | Ht 66.0 in | Wt 176.0 lb

## 2015-01-27 DIAGNOSIS — R55 Syncope and collapse: Secondary | ICD-10-CM

## 2015-01-27 DIAGNOSIS — I441 Atrioventricular block, second degree: Secondary | ICD-10-CM

## 2015-01-27 DIAGNOSIS — R42 Dizziness and giddiness: Secondary | ICD-10-CM

## 2015-01-27 DIAGNOSIS — I10 Essential (primary) hypertension: Secondary | ICD-10-CM

## 2015-01-27 MED ORDER — AMLODIPINE BESYLATE 5 MG PO TABS: 2.5000 mg | ORAL_TABLET | Freq: Every day | ORAL | Status: DC

## 2015-01-27 NOTE — Patient Instructions (Addendum)
We will be checking the following labs today - BMET, CBC   Medication Instructions:    Continue with your current medicines but  I am cutting the Norvasc in half      Testing/Procedures To Be Arranged:  Carotid doppler study  Follow-Up:   I will send a note to Dr. Inda Merlin   Try to liberalize your salt a little more.     Other Special Instructions:   N/A  Call the [place] office at [phone] if you have any questions, problems or concerns.

## 2015-01-27 NOTE — Progress Notes (Signed)
CARDIOLOGY OFFICE NOTE  Date:  01/27/2015    [name] [name] Date of Birth: Dec 28, 1935 Medical Record #[id number]  PCP:  Henrine Screws, MD  Cardiologist:  Allred    Chief Complaint  Patient presents with  . Presyncope    Work in visit - seen for Dr. Rayann Heman    History of Present Illness: [name] [name] is a 79 y.o. female who presents today for a work in visit. Seen for Dr. Rayann Heman. She has had recent pacemaker implant for heart block/presyncope. Her history is significant for HTN.   She was here in the office today for a wound check. Noted that she was continuing to have "spells". Saw her PCP this morning who gave her a RX for Xanax. During her wound check she said "it's happening again". BP 150/105. Wanted to be seen. Dr. Rayann Heman not available.  Thus on my schedule.  Comes in here. In a wheelchair. She is here with her husband. Her pacemaker has been checked extensively today - normal function. She is V pacing with underlying CHB reported.   Has had negative renal duplex for RAS noted. Her 24 hour urine for metanephrine's looks ok. Other 24 hour urine looks like insufficient amount collected. She says she continues to have these spells since January - crying - wants to know what is wrong. Not always has these spells with standing up. BP goes up. Gets anxious with these spells. She has lost weight - trying somewhat - but has changed her diet - less snacking. Stopped bread. Has stopped almost all salt. BP typically ok. Some readings in the 150's. Was pretty active until the past several months. She notes pain in the upper left back during these spells as well. She has had no chest pain. She is not short of breath. No swelling in her legs.   Past Medical History  Diagnosis Date  . Hypertension   . Broken arm Right    Past Surgical History  Procedure Laterality Date  . None    . Ep implantable device N/A 01/16/2015    Procedure: Pacemaker Implant;  Surgeon: [name], MD;   Location: [facility];  Service: Cardiovascular;  Laterality: N/A;     Medications: Current Outpatient Prescriptions  Medication Sig Dispense Refill  . amLODipine (NORVASC) 5 MG tablet Take 0.5 tablets (2.5 mg total) by mouth daily. 30 tablet 3  . aspirin 325 MG tablet Take 325 mg by mouth daily.    . calcium carbonate (TUMS EX) 750 MG chewable tablet Chew 1 tablet by mouth daily.    . Cholecalciferol (VITAMIN D3) 2000 UNITS TABS Take 2,000 Units by mouth daily.    . Cyanocobalamin (VITAMIN B-12 PO) Take 1 tablet by mouth daily.    . famotidine (PEPCID AC) 10 MG chewable tablet Chew 10 mg by mouth at bedtime.    . fluticasone (FLONASE) 50 MCG/ACT nasal spray Place 2 sprays into both nostrils daily.    . Magnesium 250 MG TABS Take 250 mg by mouth daily.    . Multiple Vitamins-Minerals (ICAPS MV PO) Take 1 tablet by mouth daily.    Marland Kitchen triamterene-hydrochlorothiazide (MAXZIDE-25) 37.5-25 MG per tablet Take 1 tablet by mouth daily.    . vitamin C (ASCORBIC ACID) 500 MG tablet Take 500 mg by mouth daily.     No current facility-administered medications for this visit.    Allergies: Allergies  Allergen Reactions  . Hctz [Hydrochlorothiazide] Shortness Of Breath and Other (See Comments)  Very short winded, made her hurt all over   . Losartan Other (See Comments)    Skin crawling feelings  . Macrolides And Ketolides Other (See Comments)    Pyloric sphincter flareups-unsure of which -mycins  . Benazepril Other (See Comments)    Doesn't work for patient  . Penicillins Other (See Comments)    abd pain     Social History: The patient  reports that she quit smoking about 28 years ago. Her smoking use included Cigarettes. She does not have any smokeless tobacco history on file. She reports that she drinks alcohol. She reports that she does not use illicit drugs.   Family History: The patient's family history includes Atrial fibrillation in her father; Heart failure in her mother;  Hypertension in her father and mother.   Review of Systems: Please see the history of present illness.   Otherwise, the review of systems is positive for none.   All other systems are reviewed and negative.   Physical Exam: VS:  BP 120/90 mmHg  Ht 5\' 6"  (1.676 m)  Wt 176 lb (79.833 kg)  BMI 28.42 kg/m2 .  BMI Body mass index is 28.42 kg/(m^2).  Wt Readings from Last 3 Encounters:  01/27/15 176 lb (79.833 kg)  01/18/15 179 lb 6.4 oz (81.375 kg)  09/02/14 197 lb (89.359 kg)   BP is 140/80 sitting and 120/70 standing.   General: Pleasant. Well developed, well nourished and in no acute distress. She has lost 21 pounds since December.  HEENT: Normal. Neck: Supple, no JVD, carotid bruits, or masses noted.  Cardiac: Regular rate and rhythm. No murmurs, rubs, or gallops. No edema.  Respiratory:  Lungs are clear to auscultation bilaterally with normal work of breathing.  GI: Soft and nontender.  MS: No deformity or atrophy. Gait and ROM intact. Skin: Warm and dry. Color is normal.  Neuro:  Strength and sensation are intact and no gross focal deficits noted.  Psych: Alert, appropriate and with normal affect.   LABORATORY DATA:  EKG:  EKG is ordered today. This shows A sensing and V paced rhythm.   Lab Results  Component Value Date   WBC 6.3 01/16/2015   HGB 13.8 01/16/2015   HCT 42.2 01/16/2015   PLT 251 01/16/2015   GLUCOSE 87 01/17/2015   CHOL 136 01/16/2015   TRIG 84 01/16/2015   HDL 67 01/16/2015   LDLCALC 52 01/16/2015   ALT 19 01/16/2015   AST 28 01/16/2015   NA 138 01/17/2015   K 3.7 01/17/2015   CL 105 01/17/2015   CREATININE 0.81 01/17/2015   BUN 13 01/17/2015   CO2 23 01/17/2015   TSH 2.274 01/14/2015   INR 1.00 01/15/2015   HGBA1C 5.7* 01/14/2015    BNP (last 3 results)  Recent Labs  01/14/15 2221 01/16/15 0940  BNP 17.6 22.4    ProBNP (last 3 results) No results for input(s): PROBNP in the last 8760 hours.   Other Studies Reviewed  Today:  Renal Duplex Summary:  - No evidence of renal artery stenosis noted bilaterally. - Left distal renal artery demonstrates borderline increased velocities. - Bilateral intrarenal resistive indices are elevated. Atypical waveforms noted in bilateral renal arteries and intrarenal flow, with lowered diastolic flow. Etiology unknown.   Echo Study Conclusions from 01/2015  - Left ventricle: The cavity size was normal. Systolic function was normal. The estimated ejection fraction was in the range of 60% to 65%. Wall motion was normal; there were no regional wall motion abnormalities.  Doppler parameters are consistent with abnormal left ventricular relaxation (grade 1 diastolic dysfunction). - Left atrium: The atrium was mildly dilated. - Atrial septum: No defect or patent foramen ovale was identified.  CTA CHEST IMPRESSION: 1. No acute cardiopulmonary disease. 2. Negative for pulmonary embolism. 3. Atherosclerosis and coronary artery disease. 4. Chronic T7 compression fracture  MRI HEAD FINDINGS FROM 08/2014: Age-appropriate atrophy. Negative for hydrocephalus.  Negative for acute infarct  Small white matter hyperintensities bilaterally consistent with microvascular chronic ischemia. Mild chronic changes in the pons. Cerebellum intact.  Negative for hemorrhage  Negative for mass or edema.  Paranasal sinuses are clear.  IMPRESSION: No acute abnormality. Normal for age.   Electronically Signed  By: Franchot Gallo M.D.  On: 09/02/2014 17:29  Assessment/Plan: 1. Presyncope - unclear etiology. Her pacemaker has been checked and is functioning normally. The search AV mode is on -  She is pacer dependent - this will be turned off. She is orthostatic here today. I have cut her Norvasc back. Liberalize her salt a little. Support stockings. Will check carotid dopplers. Not able to have MRA of her vertebrals. I have told her to use the Xanax if  needed. She keeps asking if she needs to be hospitalized - I do not see an indication for an admission at this time.   2. HTN - cutting Norvasc back. She will continue to monitor. Not clear to me if her 24 hour urine needs to be repeated. Will defer to Dr. Inda Merlin. No evidence of RAS.   3. Weight loss - noted in her discharge summary for possible cancer screening - will defer to Dr. Inda Merlin. TSH was normal.   She would like to see Dr. Percival Spanish if needed. Will see how she does with these changes. Will get her lab rechecked today as well.   Current medicines are reviewed with the patient today.  The patient does not have concerns regarding medicines other than what has been noted above.  The following changes have been made:  See above.  Labs/ tests ordered today include:    Orders Placed This Encounter  Procedures  . Basic metabolic panel  . CBC  . EKG 12-Lead     Disposition:   Further disposition to follow.    Patient is agreeable to this plan and will call if any problems develop in the interim.   Signed: Burtis Junes, RN, ANP-C 01/27/2015 4:29 PM  [facility] [address] [place], [place]  [address] Phone: [phone] Fax: [phone]

## 2015-01-28 ENCOUNTER — Other Ambulatory Visit: Payer: Self-pay | Admitting: Nurse Practitioner

## 2015-01-28 ENCOUNTER — Ambulatory Visit
Admission: RE | Admit: 2015-01-28 | Discharge: 2015-01-28 | Disposition: A | Discharge status: Home / Self Care | Payer: Medicare Other | Source: Ambulatory Visit | Attending: Nurse Practitioner | Admitting: Nurse Practitioner

## 2015-01-28 ENCOUNTER — Telehealth: Payer: Self-pay | Admitting: *Deleted

## 2015-01-28 ENCOUNTER — Other Ambulatory Visit: Payer: Self-pay | Admitting: *Deleted

## 2015-01-28 ENCOUNTER — Ambulatory Visit (HOSPITAL_COMMUNITY): Payer: Medicare Other | Attending: Cardiovascular Disease

## 2015-01-28 DIAGNOSIS — R42 Dizziness and giddiness: Secondary | ICD-10-CM | POA: Diagnosis not present

## 2015-01-28 DIAGNOSIS — R55 Syncope and collapse: Secondary | ICD-10-CM | POA: Diagnosis not present

## 2015-01-28 DIAGNOSIS — Z950 Presence of cardiac pacemaker: Secondary | ICD-10-CM

## 2015-01-28 DIAGNOSIS — I10 Essential (primary) hypertension: Secondary | ICD-10-CM

## 2015-01-28 DIAGNOSIS — I6523 Occlusion and stenosis of bilateral carotid arteries: Secondary | ICD-10-CM | POA: Insufficient documentation

## 2015-01-28 LAB — CUP PACEART INCLINIC DEVICE CHECK
Battery Voltage: 2.79 V
Brady Statistic AS VS Percent: 0 %
Date Time Interrogation Session: 20160523171918
Lead Channel Impedance Value: 801 Ohm
Lead Channel Pacing Threshold Pulse Width: 0.4 ms
Lead Channel Pacing Threshold Pulse Width: 0.4 ms
Lead Channel Sensing Intrinsic Amplitude: 11.2 mV
Lead Channel Sensing Intrinsic Amplitude: 2 mV
Lead Channel Setting Pacing Amplitude: 3.5 V
Lead Channel Setting Pacing Amplitude: 3.5 V
MDC IDC MSMT BATTERY IMPEDANCE: 100 Ohm
MDC IDC MSMT BATTERY REMAINING LONGEVITY: 131 mo
MDC IDC MSMT LEADCHNL RA IMPEDANCE VALUE: 674 Ohm
MDC IDC MSMT LEADCHNL RA PACING THRESHOLD AMPLITUDE: 1 V
MDC IDC MSMT LEADCHNL RV PACING THRESHOLD AMPLITUDE: 0.5 V
MDC IDC SET LEADCHNL RV PACING PULSEWIDTH: 0.4 ms
MDC IDC SET LEADCHNL RV SENSING SENSITIVITY: 2.8 mV
MDC IDC STAT BRADY AP VP PERCENT: 16 %
MDC IDC STAT BRADY AP VS PERCENT: 0 %
MDC IDC STAT BRADY AS VP PERCENT: 84 %

## 2015-01-28 LAB — CBC
HCT: 44.4 % (ref 36.0–46.0)
Hemoglobin: 15.1 g/dL — ABNORMAL HIGH (ref 12.0–15.0)
MCHC: 34.1 g/dL (ref 30.0–36.0)
MCV: 87.7 fl (ref 78.0–100.0)
Platelets: 256 10*3/uL (ref 150.0–400.0)
RBC: 5.06 Mil/uL (ref 3.87–5.11)
RDW: 13.8 % (ref 11.5–15.5)
WBC: 9.1 10*3/uL (ref 4.0–10.5)

## 2015-01-28 LAB — BASIC METABOLIC PANEL
BUN: 17 mg/dL (ref 6–23)
CO2: 28 mEq/L (ref 19–32)
Calcium: 9.9 mg/dL (ref 8.4–10.5)
Chloride: 96 mEq/L (ref 96–112)
Creatinine, Ser: 0.93 mg/dL (ref 0.40–1.20)
GFR: 62.07 mL/min (ref >60.00)
Glucose, Bld: 99 mg/dL (ref 70–99)
Potassium: 4.1 mEq/L (ref 3.5–5.1)
Sodium: 134 mEq/L — ABNORMAL LOW (ref 135–145)

## 2015-01-28 NOTE — Progress Notes (Signed)
Wound check appointment. Steri-strips removed. Wound without redness or edema. Incision edges approximated, wound well healed. Normal device function. Thresholds, sensing, and impedances consistent with implant measurements. Device programmed at 3.5V/auto capture programmed on for extra safety margin until 3 month visit. Histogram distribution appropriate for patient and level of activity. No mode switches or high ventricular rates noted. Patient educated about wound care, arm mobility, lifting restrictions. Pt had several questions about pacemaker and all questions were answered. Pt complains of having "spells" where feels like going to pass out. Blood pressure was checked 3 times while in device clinic. BP as follows 110/69, 154/81, 154/105. Pt was evaluated and seen by Tera Helper. Pt appointment for device clinic lasted 1 hour 45 minutes. Pt has lots of anxiety about ppm and pt doesnt think ppm is working properly. ROV in 3 months with JA.

## 2015-01-28 NOTE — Telephone Encounter (Signed)
S/w pt is aware to get CXR today at [facility] before pt comes in for Carotid doppler appointment.  Orders in.

## 2015-01-30 ENCOUNTER — Telehealth: Payer: Self-pay | Admitting: Nurse Practitioner

## 2015-01-30 NOTE — Telephone Encounter (Signed)
New Message       Pt calling stating that she has a question about teeth cleaning. Please call back and advise.

## 2015-01-31 NOTE — Telephone Encounter (Signed)
Spoke with patient and advised her to wait 6 weeks before having her teeth cleaned.

## 2015-02-02 ENCOUNTER — Emergency Department (HOSPITAL_COMMUNITY): Payer: Medicare Other

## 2015-02-02 ENCOUNTER — Emergency Department (HOSPITAL_COMMUNITY)
Admission: EM | Admit: 2015-02-02 | Discharge: 2015-02-02 | Disposition: A | Discharge status: Home / Self Care | Payer: Medicare Other | Attending: Emergency Medicine | Admitting: Emergency Medicine

## 2015-02-02 ENCOUNTER — Encounter (HOSPITAL_COMMUNITY): Payer: Self-pay | Admitting: Emergency Medicine

## 2015-02-02 DIAGNOSIS — Z7951 Long term (current) use of inhaled steroids: Secondary | ICD-10-CM | POA: Diagnosis not present

## 2015-02-02 DIAGNOSIS — Z950 Presence of cardiac pacemaker: Secondary | ICD-10-CM | POA: Diagnosis not present

## 2015-02-02 DIAGNOSIS — Z79899 Other long term (current) drug therapy: Secondary | ICD-10-CM | POA: Insufficient documentation

## 2015-02-02 DIAGNOSIS — Z87891 Personal history of nicotine dependence: Secondary | ICD-10-CM | POA: Insufficient documentation

## 2015-02-02 DIAGNOSIS — I951 Orthostatic hypotension: Secondary | ICD-10-CM | POA: Diagnosis not present

## 2015-02-02 DIAGNOSIS — Z7982 Long term (current) use of aspirin: Secondary | ICD-10-CM | POA: Diagnosis not present

## 2015-02-02 DIAGNOSIS — R0602 Shortness of breath: Secondary | ICD-10-CM | POA: Insufficient documentation

## 2015-02-02 DIAGNOSIS — Z880 Allergy status to penicillin: Secondary | ICD-10-CM | POA: Insufficient documentation

## 2015-02-02 DIAGNOSIS — R55 Syncope and collapse: Secondary | ICD-10-CM

## 2015-02-02 DIAGNOSIS — I10 Essential (primary) hypertension: Secondary | ICD-10-CM | POA: Insufficient documentation

## 2015-02-02 LAB — URINALYSIS, ROUTINE W REFLEX MICROSCOPIC
Bilirubin Urine: NEGATIVE
GLUCOSE, UA: NEGATIVE mg/dL
Hgb urine dipstick: NEGATIVE
KETONES UR: NEGATIVE mg/dL
Leukocytes, UA: NEGATIVE
NITRITE: NEGATIVE
Protein, ur: NEGATIVE mg/dL
SPECIFIC GRAVITY, URINE: 1.006 (ref 1.005–1.030)
Urobilinogen, UA: 0.2 mg/dL (ref 0.0–1.0)
pH: 6 (ref 5.0–8.0)

## 2015-02-02 LAB — COMPREHENSIVE METABOLIC PANEL
ALK PHOS: 148 U/L — AB (ref 38–126)
ALT: 22 U/L (ref 14–54)
ANION GAP: 11 (ref 5–15)
AST: 33 U/L (ref 15–41)
Albumin: 4 g/dL (ref 3.5–5.0)
BILIRUBIN TOTAL: 0.5 mg/dL (ref 0.3–1.2)
BUN: 16 mg/dL (ref 6–20)
CHLORIDE: 98 mmol/L — AB (ref 101–111)
CO2: 27 mmol/L (ref 22–32)
Calcium: 10 mg/dL (ref 8.9–10.3)
Creatinine, Ser: 0.97 mg/dL (ref 0.44–1.00)
GFR calc non Af Amer: 55 mL/min — ABNORMAL LOW (ref >60)
Glucose, Bld: 98 mg/dL (ref 65–99)
POTASSIUM: 3.8 mmol/L (ref 3.5–5.1)
SODIUM: 136 mmol/L (ref 135–145)
Total Protein: 7.9 g/dL (ref 6.5–8.1)

## 2015-02-02 LAB — CBC WITH DIFFERENTIAL/PLATELET
Basophils Absolute: 0 10*3/uL (ref 0.0–0.1)
Basophils Relative: 1 % (ref 0–1)
Eosinophils Absolute: 0.3 10*3/uL (ref 0.0–0.7)
Eosinophils Relative: 4 % (ref 0–5)
HCT: 43.2 % (ref 36.0–46.0)
Hemoglobin: 14.5 g/dL (ref 12.0–15.0)
Lymphocytes Relative: 23 % (ref 12–46)
Lymphs Abs: 1.8 10*3/uL (ref 0.7–4.0)
MCH: 29.8 pg (ref 26.0–34.0)
MCHC: 33.6 g/dL (ref 30.0–36.0)
MCV: 88.9 fL (ref 78.0–100.0)
Monocytes Absolute: 0.9 10*3/uL (ref 0.1–1.0)
Monocytes Relative: 12 % (ref 3–12)
Neutro Abs: 4.7 10*3/uL (ref 1.7–7.7)
Neutrophils Relative %: 60 % (ref 43–77)
PLATELETS: 268 10*3/uL (ref 150–400)
RBC: 4.86 MIL/uL (ref 3.87–5.11)
RDW: 13.7 % (ref 11.5–15.5)
WBC: 7.7 10*3/uL (ref 4.0–10.5)

## 2015-02-02 LAB — BRAIN NATRIURETIC PEPTIDE: B Natriuretic Peptide: 35 pg/mL (ref 0.0–100.0)

## 2015-02-02 LAB — TROPONIN I: Troponin I: <0.03 ng/mL (ref <0.031)

## 2015-02-02 MED ORDER — CEPHALEXIN 500 MG PO CAPS: 500.0000 mg | ORAL_CAPSULE | Freq: Four times a day (QID) | ORAL | Status: DC

## 2015-02-02 NOTE — ED Notes (Signed)
Reports feeling "not right" - like she needs to pass out, but has not passed out. States it is not lightheadedness, it is not a headache, but a foggy feeling. Comes in waves. An hour ago had one of these waves with a period of SOB and nausea. Took 2 ASA at home. Called EMS. Had pacemaker placed 2 weeks ago. EMS reports paced rhythm at 70 with two short runs up to 140bpm. Patient denies pain on arrival.

## 2015-02-02 NOTE — ED Notes (Signed)
Pt ambulated well in hall, pt was steady while ambulating. Pt stated she felt unsteady while ambulating.

## 2015-02-02 NOTE — ED Provider Notes (Signed)
CSN: [id number]     Arrival date & time 02/02/15  1804 History   First MD Initiated Contact with Patient 02/02/15 1807     Chief Complaint  Patient presents with  . Near Syncope     (Consider location/radiation/quality/duration/timing/severity/associated sxs/prior Treatment) HPI Comments: Patient presents to the ER for evaluation of sensation that she is going to pass out. Patient reports that she has been having intermittent episodes of feeling dizzy, short of breath. Patient was hospitalized 2 weeks ago for shortness of breath and diagnosed with heart block, had a pacemaker placed. Patient reports that since she left the hospital she has been having significant spells of feeling dizzy. She has been seen by her cardiologist twice since discharge, has had her medications changed, but continues to have symptoms of feeling dizzy and like she is going to pass out. She has not been expressing any chest pain.  Patient is a 79 y.o. female presenting with near-syncope.  Near Syncope Associated symptoms include shortness of breath. Pertinent negatives include no chest pain.    Past Medical History  Diagnosis Date  . Hypertension   . Broken arm Right   Past Surgical History  Procedure Laterality Date  . None    . Ep implantable device N/A 01/16/2015    Procedure: Pacemaker Implant;  Surgeon: [name], MD;  Location: [facility];  Service: Cardiovascular;  Laterality: N/A;   Family History  Problem Relation Age of Onset  . Heart failure Mother   . Hypertension Mother   . Hypertension Father   . Atrial fibrillation Father    History  Substance Use Topics  . Smoking status: Former Smoker    Types: Cigarettes    Quit date: 09/06/1986  . Smokeless tobacco: Not on file  . Alcohol Use: No     Comment: occasionally   OB History    No data available     Review of Systems  Constitutional: Positive for fatigue.  Respiratory: Positive for shortness of breath.   Cardiovascular:  Positive for near-syncope. Negative for chest pain.  Neurological: Positive for dizziness.  All other systems reviewed and are negative.     Allergies  Hctz; Losartan; Macrolides and ketolides; Benazepril; and Penicillins  Home Medications   Prior to Admission medications   Medication Sig Start Date End Date Taking? Authorizing Provider  amLODipine (NORVASC) 5 MG tablet Take 0.5 tablets (2.5 mg total) by mouth daily. 01/27/15   Burtis Junes, NP  aspirin 325 MG tablet Take 325 mg by mouth daily.    Historical Provider, MD  calcium carbonate (TUMS EX) 750 MG chewable tablet Chew 1 tablet by mouth daily.    Historical Provider, MD  cephALEXin (KEFLEX) 500 MG capsule Take 1 capsule (500 mg total) by mouth 4 (four) times daily. 02/02/15   Orpah Greek, MD  Cholecalciferol (VITAMIN D3) 2000 UNITS TABS Take 2,000 Units by mouth daily.    Historical Provider, MD  Cyanocobalamin (VITAMIN B-12 PO) Take 1 tablet by mouth daily.    Historical Provider, MD  famotidine (PEPCID AC) 10 MG chewable tablet Chew 10 mg by mouth at bedtime.    Historical Provider, MD  fluticasone (FLONASE) 50 MCG/ACT nasal spray Place 2 sprays into both nostrils daily. 12/27/14   Historical Provider, MD  Magnesium 250 MG TABS Take 250 mg by mouth daily.    Historical Provider, MD  Multiple Vitamins-Minerals (ICAPS MV PO) Take 1 tablet by mouth daily.    Historical Provider, MD  triamterene-hydrochlorothiazide (MAXZIDE-25) 37.5-25 MG per tablet Take 1 tablet by mouth daily. 12/27/14   Historical Provider, MD  vitamin C (ASCORBIC ACID) 500 MG tablet Take 500 mg by mouth daily.    Historical Provider, MD   BP 154/79 mmHg  Pulse 70  Temp(Src) 97.6 F (36.4 C) (Oral)  Resp 18  Ht 5' 6.5" (1.689 m)  Wt 176 lb (79.833 kg)  BMI 27.98 kg/m2  SpO2 96% Physical Exam  Constitutional: She is oriented to person, place, and time. She appears well-developed and well-nourished. No distress.  HENT:  Head: Normocephalic and  atraumatic.  Right Ear: Hearing normal.  Left Ear: Hearing normal.  Nose: Nose normal.  Mouth/Throat: Oropharynx is clear and moist and mucous membranes are normal.  Eyes: Conjunctivae and EOM are normal. Pupils are equal, round, and reactive to light.  Neck: Normal range of motion. Neck supple.  Cardiovascular: Regular rhythm, S1 normal and S2 normal.  Exam reveals no gallop and no friction rub.   No murmur heard. Pulmonary/Chest: Effort normal and breath sounds normal. No respiratory distress. She exhibits no tenderness.  Abdominal: Soft. Normal appearance and bowel sounds are normal. There is no hepatosplenomegaly. There is no tenderness. There is no rebound, no guarding, no tenderness at McBurney's point and negative Murphy's sign. No hernia.  Musculoskeletal: Normal range of motion.  Neurological: She is alert and oriented to person, place, and time. She has normal strength. No cranial nerve deficit or sensory deficit. Coordination normal. GCS eye subscore is 4. GCS verbal subscore is 5. GCS motor subscore is 6.  Skin: Skin is warm, dry and intact. No rash noted. No cyanosis.  Psychiatric: She has a normal mood and affect. Her speech is normal and behavior is normal. Thought content normal.  Nursing note and vitals reviewed.   ED Course  Procedures (including critical care time) Labs Review Labs Reviewed  COMPREHENSIVE METABOLIC PANEL - Abnormal; Notable for the following:    Chloride 98 (*)    Alkaline Phosphatase 148 (*)    GFR calc non Af Amer 55 (*)    All other components within normal limits  CBC WITH DIFFERENTIAL/PLATELET  URINALYSIS, ROUTINE W REFLEX MICROSCOPIC (NOT AT [facility])  TROPONIN I  BRAIN NATRIURETIC PEPTIDE    Imaging Review Dg Chest 2 View  02/02/2015   CLINICAL DATA:  Acute onset of generalized chest pain and shortness of breath. Initial encounter.  EXAM: CHEST  2 VIEW  COMPARISON:  Chest radiograph performed 01/28/2015  FINDINGS: The lungs are well-aerated.  Minimal bibasilar atelectasis or scarring is noted. There is no evidence of pleural effusion or pneumothorax.  The heart is borderline normal in size. A pacemaker is noted at the left chest wall, with leads ending at the right atrium right ventricle. No acute osseous abnormalities are seen. A chronic deformity is noted at the mid thoracic spine. Chronic deformity is also noted at the right humeral head.  IMPRESSION: Minimal bibasilar atelectasis or scarring noted. Lungs otherwise clear.   Electronically Signed   By: Garald Balding M.D.   On: 02/02/2015 19:12     EKG Interpretation None      MDM   Final diagnoses:  Shortness of breath  Near syncope    Patient presents to the ER for evaluation of dizziness and feeling like she is going to pass out. Patient reports that symptoms occur intermittently throughout the day. This has been ongoing for some time. Patient was recently hospitalized for heart block and had a pacemaker placed.  She continues to have intermittent episodes of dizziness and feeling like she is going to pass out. During her recent visit to cardiology, she had her Norvasc dosing decreased. She had some changes made to her pacemaker. She was noted to be orthostatic at that time. Patient is mildly orthostatic here in the ER once again. Patient is dependent on her pacemaker. She is likely having episodes of brief hypotension secondary to this. She might require repeat evaluation by Dr. Rayann Heman, perhaps an increase in her pacemaker rate. Patient reports that she feels congested in her sinuses and has had similar symptoms with a sinus infection in the past. She was prescribed Keflex for this. She is to back off on her blood pressure medications for the weekend until she can follow-up with her primary doctor and cardiologist.   Orpah Greek, MD 02/02/15 2156

## 2015-02-02 NOTE — Discharge Instructions (Signed)
Near-Syncope Near-syncope (commonly known as near fainting) is sudden weakness, dizziness, or feeling like you might pass out. During an episode of near-syncope, you may also develop pale skin, have tunnel vision, or feel sick to your stomach (nauseous). Near-syncope may occur when getting up after sitting or while standing for a long time. It is caused by a sudden decrease in blood flow to the brain. This decrease can result from various causes or triggers, most of which are not serious. However, because near-syncope can sometimes be a sign of something serious, a medical evaluation is required. The specific cause is often not determined. HOME CARE INSTRUCTIONS  Monitor your condition for any changes. The following actions may help to alleviate any discomfort you are experiencing:  Have someone stay with you until you feel stable.  Lie down right away and prop your feet up if you start feeling like you might faint. Breathe deeply and steadily. Wait until all the symptoms have passed. Most of these episodes last only a few minutes. You may feel tired for several hours.   Drink enough fluids to keep your urine clear or pale yellow.   If you are taking blood pressure or heart medicine, get up slowly when seated or lying down. Take several minutes to sit and then stand. This can reduce dizziness.  Follow up with your health care provider as directed. SEEK IMMEDIATE MEDICAL CARE IF:   You have a severe headache.   You have unusual pain in the chest, abdomen, or back.   You are bleeding from the mouth or rectum, or you have black or tarry stool.   You have an irregular or very fast heartbeat.   You have repeated fainting or have seizure-like jerking during an episode.   You faint when sitting or lying down.   You have confusion.   You have difficulty walking.   You have severe weakness.   You have vision problems.  MAKE SURE YOU:   Understand these instructions.  Will  watch your condition.  Will get help right away if you are not doing well or get worse. Document Released: 08/23/2005 Document Revised: 08/28/2013 Document Reviewed: 01/26/2013 [facility] Patient Information 2015 [place], [place]. This information is not intended to replace advice given to you by your health care provider. Make sure you discuss any questions you have with your health care provider.  Orthostatic Hypotension Orthostatic hypotension is a sudden drop in blood pressure. It happens when you quickly stand up from a seated or lying position. You may feel dizzy or light-headed. This can last for just a few seconds or for up to a few minutes. It is usually not a serious problem. However, if this happens frequently or gets worse, it can be a sign of something more serious. CAUSES  Different things can cause orthostatic hypotension, including:   Loss of body fluids (dehydration).  Medicines that lower blood pressure.  Sudden changes in posture, such as standing up quickly after you have been sitting or lying down.  Taking too much of your medicine. SIGNS AND SYMPTOMS   Light-headedness or dizziness.   Fainting or near-fainting.   A fast heart rate.   Weakness.   Feeling tired (fatigue).  DIAGNOSIS  Your health care provider may do several things to help diagnose your condition and identify the cause. These may include:   Taking a medical history and doing a physical exam.  Checking your blood pressure. Your health care provider will check your blood pressure when you  are: °¨ Lying down. °¨ Sitting. °¨ Standing. °· Using tilt table testing. In this test, you lie down on a table that moves from a lying position to a standing position. You will be strapped onto the table. This test monitors your blood pressure and heart rate when you are in different positions. °TREATMENT  °Treatment will vary depending on the cause. Possible treatments include:  °· Changing the dosage of your  medicines.  °· Wearing compression stockings on your lower legs. °· Standing up slowly after sitting or lying down. °· Eating more salt. °· Eating frequent, small meals. °· In some cases, getting IV fluids. °· Taking medicine to enhance fluid retention. °HOME CARE INSTRUCTIONS °· Only take over-the-counter or prescription medicines as directed by your health care provider. °¨ Follow your health care provider's instructions for changing the dosage of your current medicines. °¨ Do not stop or adjust your medicine on your own. °· Stand up slowly after sitting or lying down. This allows your body to adjust to the different position. °· Wear compression stockings as directed. °· Eat extra salt as directed. °¨ Do not add extra salt to your diet unless directed to by your health care provider. °· Eat frequent, small meals. °· Avoid standing suddenly after eating. °· Avoid hot showers or excessive heat as directed by your health care provider. °· Keep all follow-up appointments. °SEEK MEDICAL CARE IF: °· You continue to feel dizzy or light-headed after standing. °· You feel groggy or confused. °· You feel cold, clammy, or sick to your stomach (nauseous). °· You have blurred vision. °· You feel short of breath. °SEEK IMMEDIATE MEDICAL CARE IF:  °· You faint after standing. °· You have chest pain.  °· You have difficulty breathing.   °· You lose feeling or movement in your arms or legs.   °· You have slurred speech or difficulty talking, or you are unable to talk.   °MAKE SURE YOU:  °· Understand these instructions. °· Will watch your condition. °· Will get help right away if you are not doing well or get worse. °Document Released: 08/13/2002 Document Revised: 08/28/2013 Document Reviewed: 06/15/2013 °[employer]® Patient Information ©2015 [employer]. This information is not intended to replace advice given to you by your health care provider. Make sure you discuss any questions you have with your health care provider. ° ° °

## 2015-02-02 NOTE — ED Notes (Signed)
Patient transported to X-ray 

## 2015-02-04 ENCOUNTER — Encounter: Payer: Self-pay | Admitting: Internal Medicine

## 2015-02-04 ENCOUNTER — Telehealth: Payer: Self-pay | Admitting: Internal Medicine

## 2015-02-04 ENCOUNTER — Encounter: Payer: Self-pay | Admitting: *Deleted

## 2015-02-04 ENCOUNTER — Ambulatory Visit: Payer: Medicare Other

## 2015-02-04 NOTE — Telephone Encounter (Signed)
New Message       Pt calling stating that she went to the ER this weekend b/c of elevated BP and she felt like she was going to pass out. Pt states that she was told that she needed to see Dr. Rayann Heman as soon as possible b/c they don't think the pacemaker is in sync with her heart. Please call back and advise.

## 2015-02-04 NOTE — Patient Instructions (Signed)
Pt was a walk in today complaining of pre syncope and some blurry vision when she felt like she was going to pass out. Brought pt back and checked vitals and did EKG.  BP 138/71, HR 78, O2 97%. Pt stated that she was seen in the ER a few days ago and that the ER doctor felt that the pt needed to "have her pacemaker adjusted." Pt noted to be very anxious and worried about something happening to her while she is at home with her husband. Charles from EP came over and interrogated pt's pacemaker.  No changes made, normal reading. Dr. Marlou Porch, DOD, asked that pt hold Amlodipine. Informed pt of this information and she was agreeable. Pt recently prescribed Xanax from another provider.  Theodosia Quay, RN encouraged pt to try taking this medication to see if it will help relax her. Pt hesitant to try this.  Elberta Leatherwood, PharmD spoke to pt about the medication and that helped ease the pt's mind and she was agreeable to give it a try. Reminded pt of appt with Dr. Rayann Heman on Thursday. Pt agreeable to stopping Amlodipine and to try Xanax and will let Dr. Rayann Heman know on Thursday how she is doing with changes.

## 2015-02-04 NOTE — Telephone Encounter (Signed)
Have tried to call and the number is busy.  The patient's device is working normally and I will discuss with Dr Rayann Heman tomorrow

## 2015-02-05 LAB — CUP PACEART INCLINIC DEVICE CHECK
Brady Statistic AP VP Percent: 14 %
Brady Statistic AP VS Percent: 0 %
Lead Channel Impedance Value: 563 Ohm
Lead Channel Impedance Value: 749 Ohm
Lead Channel Pacing Threshold Amplitude: 0.5 V
Lead Channel Pacing Threshold Amplitude: 1 V
Lead Channel Pacing Threshold Pulse Width: 0.4 ms
Lead Channel Pacing Threshold Pulse Width: 0.4 ms
Lead Channel Setting Pacing Amplitude: 3.5 V
Lead Channel Setting Pacing Amplitude: 3.5 V
MDC IDC MSMT BATTERY IMPEDANCE: 100 Ohm
MDC IDC MSMT BATTERY REMAINING LONGEVITY: 127 mo
MDC IDC MSMT BATTERY VOLTAGE: 2.79 V
MDC IDC MSMT LEADCHNL RA SENSING INTR AMPL: 1.4 mV
MDC IDC SESS DTM: 20160531190826
MDC IDC SET LEADCHNL RV PACING PULSEWIDTH: 0.4 ms
MDC IDC SET LEADCHNL RV SENSING SENSITIVITY: 2.8 mV
MDC IDC STAT BRADY AS VP PERCENT: 86 %
MDC IDC STAT BRADY AS VS PERCENT: 0 %

## 2015-02-06 ENCOUNTER — Encounter: Payer: Self-pay | Admitting: Internal Medicine

## 2015-02-06 ENCOUNTER — Ambulatory Visit (INDEPENDENT_AMBULATORY_CARE_PROVIDER_SITE_OTHER): Payer: Medicare Other | Admitting: Internal Medicine

## 2015-02-06 VITALS — BP 142/76 | HR 74 | Ht 66.5 in | Wt 177.6 lb

## 2015-02-06 DIAGNOSIS — R55 Syncope and collapse: Secondary | ICD-10-CM | POA: Diagnosis not present

## 2015-02-06 DIAGNOSIS — I10 Essential (primary) hypertension: Secondary | ICD-10-CM | POA: Diagnosis not present

## 2015-02-06 DIAGNOSIS — I441 Atrioventricular block, second degree: Secondary | ICD-10-CM | POA: Diagnosis not present

## 2015-02-06 LAB — CUP PACEART INCLINIC DEVICE CHECK
Battery Impedance: 100 Ohm
Battery Remaining Longevity: 127 mo
Battery Voltage: 2.79 V
Brady Statistic AP VP Percent: 14 %
Brady Statistic AS VP Percent: 86 %
Date Time Interrogation Session: 20160602154108
Lead Channel Impedance Value: 779 Ohm
Lead Channel Pacing Threshold Amplitude: 0.625 V
Lead Channel Pacing Threshold Pulse Width: 0.4 ms
Lead Channel Pacing Threshold Pulse Width: 0.4 ms
MDC IDC MSMT LEADCHNL RA IMPEDANCE VALUE: 572 Ohm
MDC IDC MSMT LEADCHNL RA PACING THRESHOLD AMPLITUDE: 1 V
MDC IDC MSMT LEADCHNL RA SENSING INTR AMPL: 1.4 mV
MDC IDC SET LEADCHNL RA PACING AMPLITUDE: 3.5 V
MDC IDC SET LEADCHNL RV PACING AMPLITUDE: 3.5 V
MDC IDC SET LEADCHNL RV PACING PULSEWIDTH: 0.4 ms
MDC IDC SET LEADCHNL RV SENSING SENSITIVITY: 2.8 mV
MDC IDC STAT BRADY AP VS PERCENT: 0 %
MDC IDC STAT BRADY AS VS PERCENT: 0 %

## 2015-02-06 MED ORDER — ASPIRIN 81 MG PO TABS: 81.0000 mg | ORAL_TABLET | Freq: Every day | ORAL | Status: DC

## 2015-02-06 MED ORDER — AMLODIPINE BESYLATE 5 MG PO TABS: 5.0000 mg | ORAL_TABLET | Freq: Every day | ORAL | Status: DC

## 2015-02-06 NOTE — Progress Notes (Signed)
PCP: Henrine Screws, MD  [name] [name] is a 79 y.o. female who presents today for routine electrophysiology followup.  She presents for urgent follow-up of "spells".  She describes ongoing dizziness and spells of anxiety.  On review of my consult note prior to her PPM implant, these seem to be chronic.  She has been evaluated by Truitt Merle, Dr Inda Merlin, and has also been to the ER since her PPM implant without any significant findings.  When in the hospital, I stopped her maxzide due to dehydration.  She has since restarted this.  She has been placed on xanax for anxiety by Dr Inda Merlin.  She is chronically very concerned about her BP and checks its frequently.  Records reviewed today reveal very rare BP >150/90 with most BPs falling within the normal range.   Today, she denies symptoms of palpitations, chest pain, shortness of breath,  lower extremity edema, or syncope.  The patient is otherwise without complaint today.   Past Medical History  Diagnosis Date  . Hypertension   . Broken arm Right  . Complete heart block   . Anxiety   . Postural dizziness    Past Surgical History  Procedure Laterality Date  . Ep implantable device N/A 01/16/2015    MDT Adapta L PPM implanted by Dr Rayann Heman for complete heart block    ROS- all systems are reviewed and negative except as per HPI above  Current Outpatient Prescriptions  Medication Sig Dispense Refill  . ALPRAZolam (XANAX) 0.25 MG tablet Take 0.5 tablets by mouth daily as needed for anxiety.     Marland Kitchen amLODipine (NORVASC) 5 MG tablet Take 1 tablet (5 mg total) by mouth daily. 30 tablet 3  . aspirin 81 MG tablet Take 1 tablet (81 mg total) by mouth daily.    . calcium carbonate (TUMS EX) 750 MG chewable tablet Chew 1 tablet by mouth daily.    . cephALEXin (KEFLEX) 500 MG capsule Take 500 mg by mouth 3 (three) times daily.    . Cholecalciferol (VITAMIN D3) 2000 UNITS TABS Take 2,000 Units by mouth daily.    . Cyanocobalamin (VITAMIN B-12 PO) Take 1  tablet by mouth daily.    . famotidine (PEPCID AC) 10 MG chewable tablet Chew 10 mg by mouth at bedtime.    . fluticasone (FLONASE) 50 MCG/ACT nasal spray Place 2 sprays into both nostrils daily.    . Magnesium 250 MG TABS Take 250 mg by mouth daily.    . Multiple Vitamins-Minerals (ICAPS MV PO) Take 1 tablet by mouth daily.    . vitamin C (ASCORBIC ACID) 500 MG tablet Take 500 mg by mouth daily.     No current facility-administered medications for this visit.    Physical Exam: Filed Vitals:   02/06/15 1417  BP: 142/76  Pulse: 74  Height: 5' 6.5" (1.689 m)  Weight: 80.559 kg (177 lb 9.6 oz)    GEN- The patient is anxious appearing, alert and oriented x 3 today.   Head- normocephalic, atraumatic Eyes-  Sclera clear, conjunctiva pink Ears- hearing intact Oropharynx- clear with dry MM Lungs- Clear to ausculation bilaterally, normal work of breathing Chest- pacemaker pocket is well healed Heart- Regular rate and rhythm, no murmurs, rubs or gallops, PMI not laterally displaced GI- soft, NT, ND, + BS Extremities- no clubbing, cyanosis, or edema  Pacemaker interrogation- reviewed in detail today,  See PACEART report  Assessment and Plan:  1. Complete heart block Normal pacemaker function.  I do not feel that  this is the cause for her symptoms. See Pace Art report No changes today  2. Postural dizziness/ dehydration Appears dry on exam Stop maxzide Encourage PO hydration  3. HTN Appears well controlled I have reassured her today that her BP goal is <150/90 and that she need not worry about her BP or check it so frequently Stop diuretic as above,  Increase norvasc to 5mg  daily.  This can be further uptitrated if needed  4. Anxiety Follow-up with Dr Inda Merlin  Due to her very significant concerns and frequent visits to multiple specialists/ED, I think that it may be best for me to see her again in 1 week to try to provide continuity and guidance during this difficult time.

## 2015-02-06 NOTE — Telephone Encounter (Signed)
Patient came to office as a walk in  She was seen and will keep her appointment with Dr Rayann Heman

## 2015-02-06 NOTE — Patient Instructions (Signed)
Medication Instructions:  Your physician has recommended you make the following change in your medication:  1) Stop Maxzide 2) Increase Amlodipine to 5 mg daily 3) Decrease aspirin to 81 mg daily  Labwork: None ordered  Testing/Procedures: None ordered   Follow-Up: Your physician recommends that you schedule a follow-up appointment Mon at 8:15   Any Other Special Instructions Will Be Listed Below (If Applicable).

## 2015-02-07 ENCOUNTER — Telehealth: Payer: Self-pay | Admitting: Internal Medicine

## 2015-02-07 NOTE — Telephone Encounter (Signed)
Called and spoke with patient, her BP is good today but she is still very nervous.  I have let her know that when she has a "spell" her BP will go up and that she just needs to try and work her way through.  She is going to try and take a 1/2 a Xanax to start her day and will keep her appointment on Wed.  Her appointment was scheduled for Mon but she changed as she felt it would give the increase in medication a chance to work

## 2015-02-07 NOTE — Telephone Encounter (Signed)
New Message  Pt called. Inquires If her BP goes up over the weekend or if she has a spell what are her options. She says over the weekend she gets anxious. Please call back to discuss

## 2015-02-10 ENCOUNTER — Encounter: Payer: Self-pay | Admitting: Internal Medicine

## 2015-02-10 ENCOUNTER — Ambulatory Visit: Payer: Medicare Other | Admitting: Internal Medicine

## 2015-02-10 ENCOUNTER — Encounter: Payer: Self-pay | Admitting: *Deleted

## 2015-02-10 ENCOUNTER — Encounter: Payer: Medicare Other | Admitting: Internal Medicine

## 2015-02-12 ENCOUNTER — Encounter: Payer: Self-pay | Admitting: Internal Medicine

## 2015-02-12 ENCOUNTER — Ambulatory Visit (INDEPENDENT_AMBULATORY_CARE_PROVIDER_SITE_OTHER): Payer: Medicare Other | Admitting: Internal Medicine

## 2015-02-12 VITALS — BP 132/68 | HR 77 | Ht 66.5 in | Wt 177.8 lb

## 2015-02-12 DIAGNOSIS — I441 Atrioventricular block, second degree: Secondary | ICD-10-CM

## 2015-02-12 DIAGNOSIS — I10 Essential (primary) hypertension: Secondary | ICD-10-CM | POA: Diagnosis not present

## 2015-02-12 MED ORDER — AMLODIPINE BESYLATE 5 MG PO TABS: 7.5000 mg | ORAL_TABLET | Freq: Every day | ORAL | Status: DC

## 2015-02-12 NOTE — Progress Notes (Signed)
PCP: Henrine Screws, MD  [name] [name] is a 79 y.o. female who presents today for routine electrophysiology followup.  She seems better.  She is walking and not in a wheelchair today.  She is chronically very concerned about her BP and checks its frequently.  Records reviewed today reveal very rare BP >150/90 with most BPs falling within the normal range.  I have reassured her today.   Today, she denies symptoms of palpitations, chest pain, shortness of breath,  lower extremity edema, or syncope.  The patient is otherwise without complaint today.   Past Medical History  Diagnosis Date  . Hypertension   . Broken arm Right  . Complete heart block   . Anxiety   . Postural dizziness    Past Surgical History  Procedure Laterality Date  . Ep implantable device N/A 01/16/2015    MDT Adapta L PPM implanted by Dr Rayann Heman for complete heart block    ROS- all systems are reviewed and negative except as per HPI above  Current Outpatient Prescriptions  Medication Sig Dispense Refill  . ALPRAZolam (XANAX) 0.25 MG tablet Take 0.5 tablets by mouth daily as needed for anxiety.     Marland Kitchen amLODipine (NORVASC) 5 MG tablet Take 1.5 tablets (7.5 mg total) by mouth daily. 45 tablet 3  . aspirin 81 MG tablet Take 1 tablet (81 mg total) by mouth daily.    . calcium carbonate (TUMS EX) 750 MG chewable tablet Chew 1 tablet by mouth daily.    . famotidine (PEPCID AC) 10 MG chewable tablet Chew 10 mg by mouth at bedtime.    . fluticasone (FLONASE) 50 MCG/ACT nasal spray Place 2 sprays into both nostrils daily.    . Multiple Vitamins-Minerals (ICAPS MV PO) Take 1 tablet by mouth daily.     No current facility-administered medications for this visit.    Physical Exam: Filed Vitals:   02/12/15 1120  BP: 132/68  Pulse: 77  Height: 5' 6.5" (1.689 m)  Weight: 80.65 kg (177 lb 12.8 oz)  SpO2: 98%    GEN- The patient is anxious appearing, alert and oriented x 3 today.   Head- normocephalic, atraumatic Eyes-   Sclera clear, conjunctiva pink Ears- hearing intact Oropharynx- clear with dry MM Lungs- Clear to ausculation bilaterally, normal work of breathing Chest- pacemaker pocket is well healed Heart- Regular rate and rhythm, no murmurs, rubs or gallops, PMI not laterally displaced GI- soft, NT, ND, + BS Extremities- no clubbing, cyanosis, or edema  Pacemaker interrogation- reviewed in detail today,  See PACEART report  Assessment and Plan:  1. Complete heart block Pacemaker not checked today  2. Postural dizziness/ dehydration Better of maxzide but remains dry on exam  3. HTN Appears well controlled I have reassured her today that her BP goal is <150/90 and that she need not worry about her BP or check it so frequently Increase norvasc to 7.5mg  daily  4. Anxiety Follow-up with Dr Inda Merlin  Due to her very significant concerns and frequent visits to multiple specialists/ED, I think that it may be best for me to see her again in 1 week to try to provide continuity and guidance during this difficult time.

## 2015-02-12 NOTE — Patient Instructions (Signed)
Medication Instructions:  Your physician has recommended you make the following change in your medication:  1) Increase Amlodipine to 7.5mg  daily   Labwork: None ordered  Testing/Procedures: None ordered  Follow-Up: Your physician recommends that you schedule a follow-up appointment in: 7-10 days with Dr Rayann Heman   Any Other Special Instructions Will Be Listed Below (If Applicable).

## 2015-02-18 ENCOUNTER — Other Ambulatory Visit: Payer: Self-pay

## 2015-02-19 ENCOUNTER — Ambulatory Visit (INDEPENDENT_AMBULATORY_CARE_PROVIDER_SITE_OTHER): Payer: Medicare Other | Admitting: Internal Medicine

## 2015-02-19 ENCOUNTER — Encounter: Payer: Self-pay | Admitting: Internal Medicine

## 2015-02-19 VITALS — BP 110/60 | HR 80 | Ht 66.5 in | Wt 180.6 lb

## 2015-02-19 DIAGNOSIS — I10 Essential (primary) hypertension: Secondary | ICD-10-CM | POA: Diagnosis not present

## 2015-02-19 NOTE — Patient Instructions (Signed)
Medication Instructions:  Your physician recommends that you continue on your current medications as directed. Please refer to the Current Medication list given to you today.   Labwork: None ordered  Testing/Procedures: None ordered  Follow-Up: Your physician recommends that you schedule a follow-up appointment in: 6 weeks with Dr Rayann Heman   Any Other Special Instructions Will Be Listed Below (If Applicable).

## 2015-02-19 NOTE — Progress Notes (Signed)
PCP: Henrine Screws, MD  [name] [name] is a 79 y.o. female who presents today for routine electrophysiology followup.  She is clearly doing better.  I think most of her issue appears to be cardiac awareness and anxiety related to fixation on her blood pressure.  I was able to convince her last week to check her bp less often and to go on vacation at the beach.  She was able to enjoy her time at the beach without any "episodes".     Today, she denies symptoms of palpitations, chest pain, shortness of breath, or syncope.  She is worried about BLE edema, though she really does not have any on exam today.  The patient is otherwise without complaint today.   Past Medical History  Diagnosis Date  . Hypertension   . Broken arm Right  . Complete heart block   . Anxiety   . Postural dizziness    Past Surgical History  Procedure Laterality Date  . Ep implantable device N/A 01/16/2015    MDT Adapta L PPM implanted by Dr Rayann Heman for complete heart block    ROS- all systems are reviewed and negative except as per HPI above  Current Outpatient Prescriptions  Medication Sig Dispense Refill  . ALPRAZolam (XANAX) 0.25 MG tablet Take 0.5 tablets by mouth daily as needed for anxiety.     Marland Kitchen amLODipine (NORVASC) 5 MG tablet Take 1.5 tablets (7.5 mg total) by mouth daily. 45 tablet 3  . aspirin 81 MG tablet Take 1 tablet (81 mg total) by mouth daily.    . calcium carbonate (TUMS EX) 750 MG chewable tablet Chew 1 tablet by mouth daily.    . famotidine (PEPCID) 20 MG tablet Take 20 mg by mouth daily.    . fluticasone (FLONASE) 50 MCG/ACT nasal spray Place 2 sprays into both nostrils daily.    . Multiple Vitamins-Minerals (ICAPS MV PO) Take 1 tablet by mouth daily.     No current facility-administered medications for this visit.    Physical Exam: Filed Vitals:   02/19/15 1532  BP: 110/60  Pulse: 80  Height: 5' 6.5" (1.689 m)  Weight: 81.92 kg (180 lb 9.6 oz)  SpO2: 98%    GEN- The patient is  anxious appearing, alert and oriented x 3 today.   Head- normocephalic, atraumatic Eyes-  Sclera clear, conjunctiva pink Ears- hearing intact Oropharynx- clear with dry MM Lungs- Clear to ausculation bilaterally, normal work of breathing Chest- pacemaker pocket is well healed Heart- Regular rate and rhythm, no murmurs, rubs or gallops, PMI not laterally displaced GI- soft, NT, ND, + BS Extremities- no clubbing, cyanosis, or edema  Pacemaker interrogation- reviewed in detail today,  See PACEART report  Assessment and Plan:  1. Complete heart block Pacemaker not checked today--> its been functioning normally  2. Postural dizziness/ dehydration Resolved off of maxzide but remains dry on exam I have discouraged any diuretic use at this time as she has clearly done much better off of this. I have encouraged salt restriction/ leg elevation as opposed to diuretic use for her edema.  3. HTN Appears well controlled I have reassured her today that her BP goal is <150/90 and that she need not worry about her BP or check it so frequently Continue norvasc to 7.5mg  daily Today, I told her to only check her blood pressure on Mondays, Wed, and Fridays (I am weaning her off of her obsessive blood pressure checks).  4. Anxiety Follow-up with Dr Inda Merlin  She has  done very well with me seeing her weekly.   She has scheduled follow-up in a few days with Dr Inda Merlin and then in early July with Dr Percival Spanish.   I will therefore see her for her 3 month pacemaker check in 6 weeks

## 2015-02-25 ENCOUNTER — Other Ambulatory Visit: Payer: Self-pay | Admitting: Internal Medicine

## 2015-02-25 DIAGNOSIS — I441 Atrioventricular block, second degree: Secondary | ICD-10-CM

## 2015-02-25 MED ORDER — AMLODIPINE BESYLATE 5 MG PO TABS: 7.5000 mg | ORAL_TABLET | Freq: Every day | ORAL | Status: DC

## 2015-02-27 ENCOUNTER — Telehealth: Payer: Self-pay | Admitting: Internal Medicine

## 2015-02-27 NOTE — Telephone Encounter (Signed)
New message      Can pt start taking magnesium again?  She is having leg cramps.

## 2015-02-27 NOTE — Telephone Encounter (Signed)
Okay to restart to see if this will help.  She says it helped before.  She will call back if needed. Seems to be doing much better

## 2015-03-21 ENCOUNTER — Ambulatory Visit (INDEPENDENT_AMBULATORY_CARE_PROVIDER_SITE_OTHER): Payer: Medicare Other | Admitting: Cardiology

## 2015-03-21 ENCOUNTER — Encounter: Payer: Self-pay | Admitting: Cardiology

## 2015-03-21 VITALS — BP 162/82 | HR 78 | Ht 66.0 in | Wt 178.0 lb

## 2015-03-21 DIAGNOSIS — I10 Essential (primary) hypertension: Secondary | ICD-10-CM

## 2015-03-21 NOTE — Progress Notes (Signed)
Cardiology Office Note   Date:  03/21/2015   ID:  [name] [name], DOB Aug 22, 1936, MRN [id number]  PCP:  Henrine Screws, MD  Cardiologist:   [name], MD   Chief Complaint  Patient presents with  . Establish Care    Patient has felt heavy headed and her ankles swell.      History of Present Illness: [name] [name] is a 79 y.o. female who presents for evaluation of difficult to control hypertension. I first met her in the hospital in consultation. She had some dizziness. She had heart block and did have a pacemaker placed. However, she said she initially didn't feel much better with this. She said finally in the last few weeks she might have started to have some improvement. She has had less dizziness. Her blood pressures have been less labile though occasionally they will go up. She has not had any presyncope or syncope. She's taking a lower dose of Norvasc 5 mg and only occasionally takes an extra 2-1/2 mg of her blood pressures going up. She denies any chest pressure, neck or arm discomfort. She's not had any new shortness of breath, PND or orthopnea. She has a little less stress although she still has some stress because her husband has chronic neck and back pain   Past Medical History  Diagnosis Date  . Hypertension   . Broken arm Right  . Complete heart block   . Anxiety   . Postural dizziness     Past Surgical History  Procedure Laterality Date  . Ep implantable device N/A 01/16/2015    MDT Adapta L PPM implanted by Dr Rayann Heman for complete heart block     Current Outpatient Prescriptions  Medication Sig Dispense Refill  . ALPRAZolam (XANAX) 0.25 MG tablet Take 0.5 tablets by mouth daily as needed for anxiety.     Marland Kitchen amLODipine (NORVASC) 5 MG tablet Take 1.5 tablets (7.5 mg total) by mouth daily. 45 tablet 3  . aspirin 81 MG tablet Take 1 tablet (81 mg total) by mouth daily.    . calcium carbonate (TUMS EX) 750 MG chewable tablet Chew 1 tablet by mouth daily.     . famotidine (PEPCID) 20 MG tablet Take 20 mg by mouth daily.    . fluticasone (FLONASE) 50 MCG/ACT nasal spray Place 2 sprays into both nostrils daily.    . Multiple Vitamins-Minerals (ICAPS MV PO) Take 1 tablet by mouth daily.     No current facility-administered medications for this visit.    Allergies:   Hctz; Losartan; Macrolides and ketolides; Benazepril; and Penicillins    ROS:  Please see the history of present illness.   Otherwise, review of systems are positive for none.   All other systems are reviewed and negative.    PHYSICAL EXAM: VS:  BP 162/82 mmHg  Pulse 78  Ht _0  (1.676 m)  Wt 178 lb (80.74 kg)  BMI 28.74 kg/m2 , BMI Body mass index is 28.74 kg/(m^2). GENERAL:  Well appearing NECK:  No jugular venous distention, waveform within normal limits, carotid upstroke brisk and symmetric, no bruits, no thyromegaly LYMPHATICS:  No cervical, inguinal adenopathy LUNGS:  Clear to auscultation bilaterally BACK:  No CVA tenderness CHEST:  Well healed pacer pocket.  HEART:  PMI not displaced or sustained,S1 and S2 within normal limits, no S3, no S4, no clicks, no rubs, no murmurs ABD:  Flat, positive bowel sounds normal in frequency in pitch, no bruits, no rebound, no guarding, no midline pulsatile  mass, no hepatomegaly, no splenomegaly EXT:  2 plus pulses throughout, no edema, no cyanosis no clubbing SKIN:  No rashes no nodules    EKG:  EKG is ordered today.    Recent Labs: 01/14/2015: TSH 2.274 01/16/2015: Magnesium 2.1 02/02/2015: ALT 22; B Natriuretic Peptide 35.0; BUN 16; Creatinine, Ser 0.97; Hemoglobin 14.5; Platelets 268; Potassium 3.8; Sodium 136    Lipid Panel    Component Value Date/Time   CHOL 136 01/16/2015 0523   TRIG 84 01/16/2015 0523   HDL 67 01/16/2015 0523   CHOLHDL 2.0 01/16/2015 0523   VLDL 17 01/16/2015 0523   LDLCALC 52 01/16/2015 0523      Wt Readings from Last 3 Encounters:  03/21/15 178 lb (80.74 kg)  02/19/15 180 lb 9.6 oz (81.92  kg)  02/12/15 177 lb 12.8 oz (80.65 kg)      Other studies Reviewed: Additional studies/ records that were reviewed today include: Hospital and EP office records. Review of the above records demonstrates:  Please see elsewhere in the note.     ASSESSMENT AND PLAN:  Complete heart block/Status post pacemaker The patient now starting to feel better finally after her pacemaker placement. She has up-to-date follow-up. I have reviewed all records. I don't think further testing is indicated. She will continue the meds as listed.  HTN This is labile but somewhat less so than previous. No change in therapy is indicated.  Anxiety We discussed when necessary dosing of her Xanax. I also gave her the name for the [place] [facility].   Current medicines are reviewed at length with the patient today.  The patient does not have concerns regarding medicines.  The following changes have been made:  no change  Labs/ tests ordered today include:  No orders of the defined types were placed in this encounter.     Disposition:   FU with me in six months.     Signed, [name], MD  03/21/2015 3:47 PM    [facility]

## 2015-03-21 NOTE — Patient Instructions (Signed)
Your physician wants you to follow-up in: 6 Months. You will receive a reminder letter in the mail two months in advance. If you don't receive a letter, please call our office to schedule the follow-up appointment.  Dr Apolonio Schneiders PHD Psychologist at [facility] phone# [phone]

## 2015-03-28 ENCOUNTER — Ambulatory Visit (INDEPENDENT_AMBULATORY_CARE_PROVIDER_SITE_OTHER): Payer: 59 | Admitting: Psychology

## 2015-03-28 DIAGNOSIS — F411 Generalized anxiety disorder: Secondary | ICD-10-CM | POA: Diagnosis not present

## 2015-04-07 ENCOUNTER — Encounter: Payer: Self-pay | Admitting: Internal Medicine

## 2015-04-07 ENCOUNTER — Ambulatory Visit (INDEPENDENT_AMBULATORY_CARE_PROVIDER_SITE_OTHER): Payer: Medicare Other | Admitting: Internal Medicine

## 2015-04-07 VITALS — BP 120/70 | HR 74 | Ht 66.5 in | Wt 177.6 lb

## 2015-04-07 DIAGNOSIS — I441 Atrioventricular block, second degree: Secondary | ICD-10-CM

## 2015-04-07 DIAGNOSIS — I10 Essential (primary) hypertension: Secondary | ICD-10-CM | POA: Diagnosis not present

## 2015-04-07 LAB — CUP PACEART INCLINIC DEVICE CHECK
Battery Voltage: 2.79 V
Brady Statistic AP VP Percent: 17 %
Brady Statistic AS VP Percent: 83 %
Lead Channel Pacing Threshold Amplitude: 0.5 V
Lead Channel Pacing Threshold Amplitude: 1 V
Lead Channel Setting Pacing Amplitude: 2.5 V
Lead Channel Setting Pacing Pulse Width: 0.4 ms
Lead Channel Setting Sensing Sensitivity: 2.8 mV
MDC IDC MSMT BATTERY IMPEDANCE: 100 Ohm
MDC IDC MSMT BATTERY REMAINING LONGEVITY: 144 mo
MDC IDC MSMT LEADCHNL RA IMPEDANCE VALUE: 529 Ohm
MDC IDC MSMT LEADCHNL RA PACING THRESHOLD PULSEWIDTH: 0.4 ms
MDC IDC MSMT LEADCHNL RA SENSING INTR AMPL: 1 mV
MDC IDC MSMT LEADCHNL RV IMPEDANCE VALUE: 781 Ohm
MDC IDC MSMT LEADCHNL RV PACING THRESHOLD PULSEWIDTH: 0.4 ms
MDC IDC SESS DTM: 20160801130245
MDC IDC SET LEADCHNL RA PACING AMPLITUDE: 2 V
MDC IDC STAT BRADY AP VS PERCENT: 0 %
MDC IDC STAT BRADY AS VS PERCENT: 0 %

## 2015-04-07 NOTE — Patient Instructions (Signed)
Medication Instructions:  Your physician recommends that you continue on your current medications as directed. Please refer to the Current Medication list given to you today.   Labwork: None ordered  Testing/Procedures: None ordered  Follow-Up: Remote monitoring is used to monitor your Pacemaker  from home. This monitoring reduces the number of office visits required to check your device to one time per year. It allows us to keep an eye on the functioning of your device to ensure it is working properly. You are scheduled for a device check from home on 07/07/15. You may send your transmission at any time that day. If you have a wireless device, the transmission will be sent automatically. After your physician reviews your transmission, you will receive a postcard with your next transmission date.  Your physician wants you to follow-up in: 12 months with Dr Allred You will receive a reminder letter in the mail two months in advance. If you don't receive a letter, please call our office to schedule the follow-up appointment.   Any Other Special Instructions Will Be Listed Below (If Applicable).   

## 2015-04-07 NOTE — Progress Notes (Signed)
PCP: Henrine Screws, MD  [name] [name] is a 79 y.o. female who presents today for routine electrophysiology followup.  BP is improved.  Dizziness resolved.   Today, she denies symptoms of palpitations,exertional chest pain, shortness of breath, or syncope.  She is worried about BLE edema, though she really does not have any on exam today.  The patient is otherwise without complaint today.   Past Medical History  Diagnosis Date  . Hypertension   . Broken arm Right  . Complete heart block   . Anxiety   . Postural dizziness    Past Surgical History  Procedure Laterality Date  . Ep implantable device N/A 01/16/2015    MDT Adapta L PPM implanted by Dr Rayann Heman for complete heart block    ROS- all systems are reviewed and negative except as per HPI above  Current Outpatient Prescriptions  Medication Sig Dispense Refill  . ALPRAZolam (XANAX) 0.25 MG tablet Take 0.5 tablets by mouth daily as needed for anxiety.     Marland Kitchen amLODipine (NORVASC) 5 MG tablet Take 2.5-5 mg by mouth daily.    Marland Kitchen aspirin 325 MG tablet Take 650 mg by mouth daily as needed (pain).    Marland Kitchen aspirin 81 MG tablet Take 1 tablet (81 mg total) by mouth daily.    . calcium carbonate (TUMS EX) 750 MG chewable tablet Chew 1 tablet by mouth daily.    . famotidine (PEPCID) 20 MG tablet Take 20 mg by mouth daily.    . fluticasone (FLONASE) 50 MCG/ACT nasal spray Place 2 sprays into both nostrils daily.    . Multiple Vitamins-Minerals (ICAPS MV PO) Take 1 tablet by mouth daily.     No current facility-administered medications for this visit.    Physical Exam: Filed Vitals:   04/07/15 1141  BP: 120/70  Pulse: 74  Height: 5' 6.5" (1.689 m)  Weight: 80.559 kg (177 lb 9.6 oz)    GEN- The patient is anxious appearing, alert and oriented x 3 today.   Head- normocephalic, atraumatic Eyes-  Sclera clear, conjunctiva pink Ears- hearing intact Oropharynx- clear with dry MM Lungs- Clear to ausculation bilaterally, normal work of  breathing Chest- pacemaker pocket is well healed Heart- Regular rate and rhythm, no murmurs, rubs or gallops, PMI not laterally displaced GI- soft, NT, ND, + BS Extremities- no clubbing, cyanosis, or edema  Pacemaker interrogation- reviewed in detail today,  See PACEART report  Assessment and Plan:  1. Complete heart block Normal pacemaker function See Pace Art report No changes today  2. Postural dizziness/ dehydration Resolved off of maxzide  Adequate hydration encouraged  3. HTN Appears well controlled I have reassured her today that her BP goal is <150/90 and that she need not worry about her BP or check it so frequently  4. Anxiety Follow-up with Dr Inda Merlin  Remote device monitoring Follow-up with Dr Percival Spanish as scheduled I will see in a year

## 2015-07-07 ENCOUNTER — Ambulatory Visit (INDEPENDENT_AMBULATORY_CARE_PROVIDER_SITE_OTHER): Payer: Medicare Other | Admitting: *Deleted

## 2015-07-07 DIAGNOSIS — I441 Atrioventricular block, second degree: Secondary | ICD-10-CM | POA: Diagnosis not present

## 2015-07-08 NOTE — Progress Notes (Signed)
Remote pacemaker transmission.   

## 2015-07-11 ENCOUNTER — Encounter: Payer: Self-pay | Admitting: Cardiology

## 2015-07-11 LAB — CUP PACEART REMOTE DEVICE CHECK
Battery Impedance: 100 Ohm
Battery Remaining Longevity: 144 mo
Brady Statistic AP VP Percent: 17 %
Brady Statistic AS VP Percent: 83 %
Brady Statistic AS VS Percent: 0 %
Date Time Interrogation Session: 20161031123140
Implantable Lead Implant Date: 20160512
Implantable Lead Location: 753859
Implantable Lead Location: 753860
Implantable Lead Model: 5092
Lead Channel Pacing Threshold Amplitude: 0.625 V
Lead Channel Pacing Threshold Amplitude: 0.75 V
Lead Channel Sensing Intrinsic Amplitude: 0.7 mV
Lead Channel Setting Pacing Amplitude: 2 V
Lead Channel Setting Pacing Pulse Width: 0.4 ms
MDC IDC LEAD IMPLANT DT: 20160512
MDC IDC MSMT BATTERY VOLTAGE: 2.79 V
MDC IDC MSMT LEADCHNL RA IMPEDANCE VALUE: 505 Ohm
MDC IDC MSMT LEADCHNL RA PACING THRESHOLD PULSEWIDTH: 0.4 ms
MDC IDC MSMT LEADCHNL RV IMPEDANCE VALUE: 795 Ohm
MDC IDC MSMT LEADCHNL RV PACING THRESHOLD PULSEWIDTH: 0.4 ms
MDC IDC SET LEADCHNL RV PACING AMPLITUDE: 2.5 V
MDC IDC SET LEADCHNL RV SENSING SENSITIVITY: 2.8 mV
MDC IDC STAT BRADY AP VS PERCENT: 0 %

## 2015-08-11 ENCOUNTER — Emergency Department (HOSPITAL_COMMUNITY): Payer: Medicare Other

## 2015-08-11 ENCOUNTER — Encounter (HOSPITAL_COMMUNITY): Payer: Self-pay

## 2015-08-11 ENCOUNTER — Observation Stay (HOSPITAL_COMMUNITY)
Admission: EM | Admit: 2015-08-11 | Discharge: 2015-08-13 | Disposition: A | Discharge status: Skilled Nursing Facility | Payer: Medicare Other | Attending: Internal Medicine | Admitting: Internal Medicine

## 2015-08-11 DIAGNOSIS — S82141A Displaced bicondylar fracture of right tibia, initial encounter for closed fracture: Principal | ICD-10-CM | POA: Insufficient documentation

## 2015-08-11 DIAGNOSIS — W19XXXA Unspecified fall, initial encounter: Secondary | ICD-10-CM

## 2015-08-11 DIAGNOSIS — I442 Atrioventricular block, complete: Secondary | ICD-10-CM | POA: Diagnosis not present

## 2015-08-11 DIAGNOSIS — S82201A Unspecified fracture of shaft of right tibia, initial encounter for closed fracture: Secondary | ICD-10-CM | POA: Diagnosis not present

## 2015-08-11 DIAGNOSIS — Y92009 Unspecified place in unspecified non-institutional (private) residence as the place of occurrence of the external cause: Secondary | ICD-10-CM | POA: Diagnosis not present

## 2015-08-11 DIAGNOSIS — Y9389 Activity, other specified: Secondary | ICD-10-CM | POA: Insufficient documentation

## 2015-08-11 DIAGNOSIS — Z7982 Long term (current) use of aspirin: Secondary | ICD-10-CM | POA: Insufficient documentation

## 2015-08-11 DIAGNOSIS — K219 Gastro-esophageal reflux disease without esophagitis: Secondary | ICD-10-CM | POA: Diagnosis not present

## 2015-08-11 DIAGNOSIS — S0101XA Laceration without foreign body of scalp, initial encounter: Secondary | ICD-10-CM

## 2015-08-11 DIAGNOSIS — Z79899 Other long term (current) drug therapy: Secondary | ICD-10-CM | POA: Insufficient documentation

## 2015-08-11 DIAGNOSIS — F419 Anxiety disorder, unspecified: Secondary | ICD-10-CM | POA: Insufficient documentation

## 2015-08-11 DIAGNOSIS — M25561 Pain in right knee: Secondary | ICD-10-CM | POA: Diagnosis not present

## 2015-08-11 DIAGNOSIS — Z87891 Personal history of nicotine dependence: Secondary | ICD-10-CM | POA: Insufficient documentation

## 2015-08-11 DIAGNOSIS — I10 Essential (primary) hypertension: Secondary | ICD-10-CM | POA: Diagnosis not present

## 2015-08-11 DIAGNOSIS — W010XXA Fall on same level from slipping, tripping and stumbling without subsequent striking against object, initial encounter: Secondary | ICD-10-CM | POA: Insufficient documentation

## 2015-08-11 DIAGNOSIS — S82209A Unspecified fracture of shaft of unspecified tibia, initial encounter for closed fracture: Secondary | ICD-10-CM | POA: Diagnosis present

## 2015-08-11 DIAGNOSIS — S82143A Displaced bicondylar fracture of unspecified tibia, initial encounter for closed fracture: Secondary | ICD-10-CM | POA: Diagnosis present

## 2015-08-11 MED ORDER — ALPRAZOLAM 0.25 MG PO TABS: 0.1250 mg | ORAL_TABLET | Freq: Every day | ORAL | Status: DC | PRN

## 2015-08-11 MED ORDER — MORPHINE SULFATE (PF) 2 MG/ML IV SOLN: 1.0000 mg | INTRAVENOUS | Status: DC | PRN

## 2015-08-11 MED ORDER — ASPIRIN 325 MG PO TABS
650.0000 mg | ORAL_TABLET | Freq: Every day | ORAL | Status: DC | PRN
Start: 2015-08-11 — End: 2015-08-12
  Administered 2015-08-12: 650 mg via ORAL
  Filled 2015-08-11 (×2): qty 2

## 2015-08-11 MED ORDER — ALPRAZOLAM 0.25 MG PO TABS: 0.0313 mg | ORAL_TABLET | Freq: Every day | ORAL | Status: DC | PRN

## 2015-08-11 MED ORDER — HYDROCODONE-ACETAMINOPHEN 5-325 MG PO TABS
1.0000 | ORAL_TABLET | Freq: Once | ORAL | Status: AC
  Administered 2015-08-11: 1 via ORAL
  Filled 2015-08-11: qty 1

## 2015-08-11 MED ORDER — FAMOTIDINE 20 MG PO TABS
20.0000 mg | ORAL_TABLET | Freq: Every day | ORAL | Status: DC
  Administered 2015-08-12 – 2015-08-13 (×2): 20 mg via ORAL
  Filled 2015-08-11 (×2): qty 1

## 2015-08-11 MED ORDER — TETANUS-DIPHTH-ACELL PERTUSSIS 5-2.5-18.5 LF-MCG/0.5 IM SUSP
0.5000 mL | Freq: Once | INTRAMUSCULAR | Status: AC
  Administered 2015-08-11: 0.5 mL via INTRAMUSCULAR
  Filled 2015-08-11: qty 0.5

## 2015-08-11 MED ORDER — HYDROCODONE-ACETAMINOPHEN 5-325 MG PO TABS
2.0000 | ORAL_TABLET | Freq: Once | ORAL | Status: AC
  Administered 2015-08-11: 0.5 via ORAL
  Filled 2015-08-11: qty 2

## 2015-08-11 MED ORDER — AMLODIPINE BESYLATE 5 MG PO TABS
5.0000 mg | ORAL_TABLET | Freq: Every day | ORAL | Status: DC
  Administered 2015-08-12: 5 mg via ORAL
  Filled 2015-08-11: qty 1

## 2015-08-11 MED ORDER — HYDROGEN PEROXIDE 3 % EX SOLN
CUTANEOUS | Status: AC
  Administered 2015-08-11: 18:00:00
  Filled 2015-08-11: qty 473

## 2015-08-11 MED ORDER — ENOXAPARIN SODIUM 40 MG/0.4ML ~~LOC~~ SOLN
40.0000 mg | SUBCUTANEOUS | Status: DC
  Filled 2015-08-11 (×3): qty 0.4

## 2015-08-11 NOTE — ED Provider Notes (Signed)
CSN: [id number]     Arrival date & time 08/11/15  1721 History   First MD Initiated Contact with Patient 08/11/15 1724     Chief Complaint  Patient presents with  . Fall    TRIP AND FALL     (Consider location/radiation/quality/duration/timing/severity/associated sxs/prior Treatment) The history is provided by the patient and the spouse. No language interpreter was used.   Mrs. Quirindongo is a 79 year old female with a history of hypertension, complete heart block with pacemaker placement, and dizziness who presents via EMS for head injury, right upper arm and right knee pain after trip and fall while going into the house. She was unable to ambulate after the fall secondary to right knee pain. She denies any loss of consciousness. She takes 81 mg of aspirin daily. No treatment prior to arrival. She is right-handed. She denies any fever, chills, recent illness, chest pain, shortness of breath, dizziness, headache, abdominal pain, nausea, vomiting, diarrhea. Tetanus status is not up-to-date.  Past Medical History  Diagnosis Date  . Hypertension   . Broken arm Right  . Complete heart block ([place])   . Anxiety   . Postural dizziness    Past Surgical History  Procedure Laterality Date  . Ep implantable device N/A 01/16/2015    MDT Adapta L PPM implanted by Dr Rayann Heman for complete heart block   Family History  Problem Relation Age of Onset  . Heart failure Mother   . Hypertension Mother   . Hypertension Father   . Atrial fibrillation Father    Social History  Substance Use Topics  . Smoking status: Former Smoker    Types: Cigarettes    Quit date: 09/06/1986  . Smokeless tobacco: None  . Alcohol Use: No     Comment: occasionally   OB History    No data available     Review of Systems  Musculoskeletal: Positive for gait problem.  All other systems reviewed and are negative.     Allergies  Hctz; Losartan; Macrolides and ketolides; Benazepril; and Penicillins  Home Medications    Prior to Admission medications   Medication Sig Start Date End Date Taking? Authorizing Provider  ALPRAZolam Duanne Moron) 0.25 MG tablet Take 0.125 tablets by mouth daily as needed for anxiety.  02/04/15  Yes Historical Provider, MD  amLODipine (NORVASC) 5 MG tablet Take 5-7.5 mg by mouth daily. May take an additional 2.5mg  as needed for unusually high blood pressure   Yes Historical Provider, MD  aspirin 325 MG tablet Take 650 mg by mouth daily as needed (pain).   Yes Historical Provider, MD  aspirin 81 MG tablet Take 1 tablet (81 mg total) by mouth daily. 02/06/15  Yes [name], MD  calcium carbonate (TUMS EX) 750 MG chewable tablet Chew 1 tablet by mouth daily.   Yes Historical Provider, MD  Cholecalciferol (VITAMIN D3) 2000 UNITS TABS Take 1 tablet by mouth daily.   Yes Historical Provider, MD  famotidine (PEPCID) 20 MG tablet Take 20 mg by mouth daily.   Yes Historical Provider, MD  fluticasone (FLONASE) 50 MCG/ACT nasal spray Place 2 sprays into both nostrils daily as needed for allergies.  12/27/14  Yes Historical Provider, MD  guaiFENesin (ROBITUSSIN) 100 MG/5ML SOLN Take 5 mLs by mouth every 4 (four) hours as needed for cough or to loosen phlegm.   Yes Historical Provider, MD  Multiple Vitamins-Minerals (ICAPS MV PO) Take 1 tablet by mouth daily.   Yes Historical Provider, MD  trolamine salicylate (ASPERCREME) 10 % cream  Apply 1 application topically as needed for muscle pain.   Yes Historical Provider, MD   BP 136/62 mmHg  Pulse 69  Temp(Src) 98 F (36.7 C) (Oral)  Resp 20  Ht 5\' 6"  (1.676 m)  Wt 81.647 kg  BMI 29.07 kg/m2  SpO2 97% Physical Exam  Constitutional: She is oriented to person, place, and time. She appears well-developed and well-nourished. No distress.  HENT:  Head: Normocephalic.  No raccoon eyes or battle signs. She has a small abrasion to the scalp but no active bleeding. No periorbital erythema or contusion.  Eyes: Conjunctivae are normal.  Neck: Normal range of  motion. Neck supple.  Cardiovascular: Normal rate, regular rhythm and normal heart sounds.   Pulmonary/Chest: Effort normal and breath sounds normal. No respiratory distress. She has no wheezes. She has no rales.  Abdominal: Soft. There is no tenderness.  Musculoskeletal: Normal range of motion.  Left knee: Abrasion to the left knee no active bleeding. Able to flex and extend the knee without difficulty or pain. 2+ DP pulse. No patellar or fibular head tenderness to palpation. No joint effusion.   Right knee: Mild swelling but no joint effusion, erythema, or abrasion to the knee. She is able to flex the knee to approximately 20 and then has significant pain along the lateral aspect of the knee. No patellar tenderness. Plus DP pulse. Unable to straight leg raise secondary to pain.   Right arm: Able to abduct and adduct the arm without difficulty. Able to flex and extend at the elbow. She has point tenderness along the distal humerus. 2+ radial pulse. No cyanosis, such as ecchymosis or erythema.  Neurological: She is alert and oriented to person, place, and time.  Skin: Skin is warm and dry.  Nursing note and vitals reviewed.   ED Course  Procedures (including critical care time) LACERATION REPAIR Performed by: Ottie Glazier Authorized by: Ottie Glazier Consent: Verbal consent obtained. Risks and benefits: risks, benefits and alternatives were discussed Consent given by: patient Patient identity confirmed: provided demographic data Prepped and Draped in normal sterile fashion Wound explored Laceration Location: mid scalp Laceration Length: 1.5cm No Foreign Bodies seen or palpated Local anesthetic: non Irrigation method: syringe Amount of cleaning: standard Skin closure: Staples Number of staples: 2 Patient tolerance: Patient tolerated the procedure well with no immediate complications.   Labs Review Labs Reviewed - No data to display  Imaging Review Ct Head Wo  Contrast  08/11/2015  CLINICAL DATA:  PT RECEIVED VIA EMS FOR AN TRIP AND FALL COMING INTO THE HOUSE. PT C/O A LACERATION TO THE HEAD, -LOC. RIGHT UPPER AND RIGHT KNEE PAIN. EXAM: CT HEAD WITHOUT CONTRAST TECHNIQUE: Contiguous axial images were obtained from the base of the skull through the vertex without intravenous contrast. COMPARISON:  MR 09/02/2014 FINDINGS: Atherosclerotic and physiologic intracranial calcifications. Early mineralization in bilateral basal ganglia. Small falcine lipoma. Mild diffuse atrophy. There is no evidence of acute intracranial hemorrhage, brain edema, mass lesion, acute infarction, mass effect, or midline shift. Acute infarct may be inapparent on noncontrast CT. No other intra-axial abnormalities are seen, and the ventricles and sulci are within normal limits in size and symmetry. No abnormal extra-axial fluid collections or masses are identified. No significant calvarial abnormality. IMPRESSION: 1. Negative for bleed or other acute intracranial process. Electronically Signed   By: Lucrezia Europe M.D.   On: 08/11/2015 18:25   Ct Knee Right Wo Contrast  08/11/2015  CLINICAL DATA:  79 year old female status post fall and right  knee pain and swelling. EXAM: CT OF THE right KNEE WITHOUT CONTRAST TECHNIQUE: Multidetector CT imaging of the right knee was performed according to the standard protocol. Multiplanar CT image reconstructions were also generated. COMPARISON:  Radiograph dated 08/11/2015 FINDINGS: There is a minimally depressed fracture of the medial tibial plateau. There is extension of the fracture line into the knee posterior cortex of the tibia. The bones are osteopenic which limits evaluation of fracture. No other fracture identified. A suprapatellar lipohemarthrosis noted. IMPRESSION: Minimally depressed fracture of the medial tibial plateau. Electronically Signed   By: Anner Crete M.D.   On: 08/11/2015 22:14   Dg Knee Complete 4 Views Right  08/11/2015  CLINICAL DATA:   Recent fall with right knee pain, initial encounter EXAM: RIGHT KNEE - COMPLETE 4+ VIEW COMPARISON:  None. FINDINGS: There is a minimally displaced fracture through the medial tibial plateau. A fat fluid level is noted within a moderate-sized knee joint effusion. No other fractures are seen. Degenerative changes of the patellofemoral joint are noted. IMPRESSION: Medial tibial plateau fracture with associated joint effusion and lipoarthrosis. Electronically Signed   By: Inez Catalina M.D.   On: 08/11/2015 18:17   Dg Humerus Right  08/11/2015  CLINICAL DATA:  Status post fall. Patient complains of right upper extremity pain. Prior right humeral head fracture. EXAM: RIGHT HUMERUS - 2+ VIEW COMPARISON:  CT of the right shoulder dated 04/22/2014 FINDINGS: There is no evidence of acute fracture. Deformity of the right humeral head is likely due to prior injury. There is increased density of the glenohumeral joint, suggestive of presence of effusion. IMPRESSION: Deformity of the right humeral head likely due to prior traumatic injury. No evidence of acute displaced fracture. Possible right glenohumeral joint effusion. Electronically Signed   By: Fidela Salisbury M.D.   On: 08/11/2015 18:17   I have personally reviewed and evaluated these image results as part of my medical decision-making.   EKG Interpretation None      MDM   Final diagnoses:  Tibial plateau fracture, right, closed, initial encounter  Laceration of scalp, initial encounter   Patient presents for right knee pain, right arm pain, and head injury after trip and fall. Her vital signs are stable and she is well-appearing. She is on 81 mg of aspirin daily. She denies any event such as chest pain, dizziness, headache, or confusion prior to the fall. Recheck: Patient cannot ambulate or weightbare secondary to pain. CT head is negative for acute bleed or acute intracranial abnormality. Right humerus shows deformity which is due to an old  injury but no acute displaced fracture. X-ray of the right knee shows a minimally displaced fracture through the medial tibial plateau.  I spoke to the orthopedist oncall who did not think the patient would need surgery after looking at the x-ray results. He did recommend a CT of the right knee. He also explained that she could be put in a knee immobilizer and be seen in the office tomorrow or Thursday. Recheck: Patient states that she cannot go home like this and that she has to go up 18 stairs to get her bedroom. She says that she cannot bear weight on her leg and that her husband cannot take care of her tonight. He says that he cannot see at night in order to get her into the house. Patient asked for pain medication but states that she can only take hydrocodone. She states all other medications make her crazy, especially strong narcotics.  I spoke  to Dr. Dreama Saa regarding patient inability to care for herself. She had prior right arm surgery and cannot use crutches. She is nonweightbearing. I think she will benefit from MedSurg placement for PT tomorrow and to be seen by orthopedics. Patient agrees with the plan.   Ottie Glazier, PA-C 08/11/15 2257  Leo Grosser, MD 08/12/15 [phone]

## 2015-08-11 NOTE — ED Notes (Signed)
Writer applied ice pack to head

## 2015-08-11 NOTE — Progress Notes (Signed)
EDCM consulted to speak to patient regarding home health services.  EDCM spoke to patient, her husband, and her neighbor at bedside.  Patient reports she lives at home with her husband.  Patient reports she has a walker and and a cane at home.  Patient confirms her pcp is Dr. Dwaine Deter.  EDCM provided list of home health agencies and discussed services.  Patient's husband reports he has had [facility] in the past and was very pleased with their services.  Patient will need wheelchair and 3 in 1 for home when discharged.  Patient reports, "there is no way I can go home like this. I can's walk.  I can't even bear weight."  Discussed with EDP.  Awaiting disposition.

## 2015-08-11 NOTE — ED Notes (Signed)
Bed: WA03 Expected date:  Expected time:  Means of arrival:  Comments: Ems-fall

## 2015-08-11 NOTE — ED Notes (Signed)
Pt was not able to ambulate with Probation officer and PA at bedside.

## 2015-08-11 NOTE — ED Notes (Signed)
PT RECEIVED VIA EMS FOR AN TRIP AND FALL COMING INTO THE HOUSE. PT C/O A LACERATION TO THE HEAD, -LOC. RIGHT UPPER AND RIGHT KNEE PAIN.

## 2015-08-11 NOTE — ED Notes (Signed)
PT ADMITTED TO THE INPT 1616-1. AAOX3. PT IN NO APPARENT DISTRESS. FAMILY AT THE BEDSIDE. REPORT WILL BE GIVEN UPON PT'S ARRIVAL TO THE ROOM.

## 2015-08-11 NOTE — H&P (Signed)
History and Physical  [name] [name] [id number] DOB: July 06, 1936 DOA: 08/11/2015  PCP: Henrine Screws, MD   Chief Complaint: Fall  History of Present Illness:  - Patient is a 79 yo female with history of HTN who was brought with cc of fall.  - Patient said she tripped and denied any dizziness/lighteadedness, imbalance, weakness, sensory or motor deficits.  - She fell on her knees and hit her head. She has been in her right knee, her right arm and her head.  - CT head was negative for fracture/bleeding. R.Xray of knee showed minimally displaced tibia plate fracture.  - R.arm Xray showed old fracture.  - Ortho recommended admitting to medicine as patient can't put weight on her right leg.  - Patient has no other complaints.   Review of Systems:  Other review of systems was done and was otherwise negative except as per HPI.   Past Medical and Surgical History:   Past Medical History  Diagnosis Date  . Hypertension   . Broken arm Right  . Complete heart block ([place])   . Anxiety   . Postural dizziness    Past Surgical History  Procedure Laterality Date  . Ep implantable device N/A 01/16/2015    MDT Adapta L PPM implanted by Dr Rayann Heman for complete heart block    Social History:   reports that she quit smoking about 28 years ago. Her smoking use included Cigarettes. She does not have any smokeless tobacco history on file. She reports that she does not drink alcohol or use illicit drugs.   Allergies  Allergen Reactions  . Hctz [Hydrochlorothiazide] Shortness Of Breath and Other (See Comments)    Very short winded, made her hurt all over   . Losartan Other (See Comments)    Skin crawling feelings  . Macrolides And Ketolides Other (See Comments)    Pyloric sphincter flareups-unsure of which -mycins  . Benazepril Other (See Comments)    Doesn't work for patient  . Penicillins Other (See Comments)    abd pain Has patient had a PCN reaction causing immediate  rash, facial/tongue/throat swelling, SOB or lightheadedness with hypotension: No Has patient had a PCN reaction causing severe rash involving mucus membranes or skin necrosis: No Has patient had a PCN reaction that required hospitalization No Has patient had a PCN reaction occurring within the last 10 years: No If all of the above answers are "NO", then may proceed with Cephalosporin use.      Family History  Problem Relation Age of Onset  . Heart failure Mother   . Hypertension Mother   . Hypertension Father   . Atrial fibrillation Father       Prior to Admission medications   Medication Sig Start Date End Date Taking? Authorizing Provider  ALPRAZolam Duanne Moron) 0.25 MG tablet Take 0.125 tablets by mouth daily as needed for anxiety.  02/04/15  Yes Historical Provider, MD  amLODipine (NORVASC) 5 MG tablet Take 5-7.5 mg by mouth daily. May take an additional 2.5mg  as needed for unusually high blood pressure   Yes Historical Provider, MD  aspirin 325 MG tablet Take 650 mg by mouth daily as needed (pain).   Yes Historical Provider, MD  aspirin 81 MG tablet Take 1 tablet (81 mg total) by mouth daily. 02/06/15  Yes [name], MD  calcium carbonate (TUMS EX) 750 MG chewable tablet Chew 1 tablet by mouth daily.   Yes Historical Provider, MD  Cholecalciferol (VITAMIN D3) 2000 UNITS TABS Take 1 tablet by mouth  daily.   Yes Historical Provider, MD  famotidine (PEPCID) 20 MG tablet Take 20 mg by mouth daily.   Yes Historical Provider, MD  fluticasone (FLONASE) 50 MCG/ACT nasal spray Place 2 sprays into both nostrils daily as needed for allergies.  12/27/14  Yes Historical Provider, MD  guaiFENesin (ROBITUSSIN) 100 MG/5ML SOLN Take 5 mLs by mouth every 4 (four) hours as needed for cough or to loosen phlegm.   Yes Historical Provider, MD  Multiple Vitamins-Minerals (ICAPS MV PO) Take 1 tablet by mouth daily.   Yes Historical Provider, MD  trolamine salicylate (ASPERCREME) 10 % cream Apply 1 application  topically as needed for muscle pain.   Yes Historical Provider, MD    Physical Exam: BP 149/71 mmHg  Pulse 77  Temp(Src) 97.8 F (36.6 C) (Oral)  Resp 18  Ht 5\' 6"  (1.676 m)  Wt 81.647 kg (180 lb)  BMI 29.07 kg/m2  SpO2 96%  GENERAL : Well developed, well nourished, alert and cooperative, and appears to be in no acute distress. HEAD: Bruise on head/sutured.  EYES: PERRL, EOMI. vision is grossly intact. EARS:  hearing grossly intact. NOSE: No nasal discharge. THROAT: Oral cavity and pharynx normal.   NECK: Neck supple CARDIAC: Normal S1 and S2. No S3, S4 or murmurs. Rhythm is regular. There is no peripheral edema LUNGS: Clear to auscultation and percussion without rales, rhonchi, wheezing or diminished breath sounds. ABDOMEN: Positive bowel sounds. Soft, nondistended, nontender. No guarding or rebound. No masses. MUSKULOSKELETAL: Right arm pain with palpation and limited extension ( old per patient). Right knee pain to palpation and lateral/medial rotation. Otherwise no joints pain to palpation. Small abrasion over left knee.  NEUROLOGICAL: The mental examination revealed the patient was oriented to person, place, and time.CN II-XII intact.  SKIN: No rash.  PSYCHIATRIC:  The patient was able to demonstrate good judgement and reason, without hallucinations, abnormal affect or abnormal behaviors during the examination. Patient is not suicidal.          Labs on Admission:  Reviewed.   Radiological Exams on Admission: Ct Head Wo Contrast  08/11/2015  CLINICAL DATA:  PT RECEIVED VIA EMS FOR AN TRIP AND FALL COMING INTO THE HOUSE. PT C/O A LACERATION TO THE HEAD, -LOC. RIGHT UPPER AND RIGHT KNEE PAIN. EXAM: CT HEAD WITHOUT CONTRAST TECHNIQUE: Contiguous axial images were obtained from the base of the skull through the vertex without intravenous contrast. COMPARISON:  MR 09/02/2014 FINDINGS: Atherosclerotic and physiologic intracranial calcifications. Early mineralization in bilateral  basal ganglia. Small falcine lipoma. Mild diffuse atrophy. There is no evidence of acute intracranial hemorrhage, brain edema, mass lesion, acute infarction, mass effect, or midline shift. Acute infarct may be inapparent on noncontrast CT. No other intra-axial abnormalities are seen, and the ventricles and sulci are within normal limits in size and symmetry. No abnormal extra-axial fluid collections or masses are identified. No significant calvarial abnormality. IMPRESSION: 1. Negative for bleed or other acute intracranial process. Electronically Signed   By: Lucrezia Europe M.D.   On: 08/11/2015 18:25   Dg Knee Complete 4 Views Right  08/11/2015  CLINICAL DATA:  Recent fall with right knee pain, initial encounter EXAM: RIGHT KNEE - COMPLETE 4+ VIEW COMPARISON:  None. FINDINGS: There is a minimally displaced fracture through the medial tibial plateau. A fat fluid level is noted within a moderate-sized knee joint effusion. No other fractures are seen. Degenerative changes of the patellofemoral joint are noted. IMPRESSION: Medial tibial plateau fracture with associated joint effusion and  lipoarthrosis. Electronically Signed   By: Inez Catalina M.D.   On: 08/11/2015 18:17   Dg Humerus Right  08/11/2015  CLINICAL DATA:  Status post fall. Patient complains of right upper extremity pain. Prior right humeral head fracture. EXAM: RIGHT HUMERUS - 2+ VIEW COMPARISON:  CT of the right shoulder dated 04/22/2014 FINDINGS: There is no evidence of acute fracture. Deformity of the right humeral head is likely due to prior injury. There is increased density of the glenohumeral joint, suggestive of presence of effusion. IMPRESSION: Deformity of the right humeral head likely due to prior traumatic injury. No evidence of acute displaced fracture. Possible right glenohumeral joint effusion. Electronically Signed   By: Fidela Salisbury M.D.   On: 08/11/2015 18:17     Assessment/Plan  Fall: Mechanical Minimally displaced  fracture in right tibia plate Ortho consulted: check CT scan of R.knee and admit to medicine Pain control with morphine prn PT/OT consult in am.   HTN: Continue Norvasc  DVT prophylaxis: Pepin enoxaparin  Consultants: Ortho Code Status: Full      Gennaro Africa M.D [facility]

## 2015-08-12 DIAGNOSIS — W19XXXD Unspecified fall, subsequent encounter: Secondary | ICD-10-CM | POA: Diagnosis not present

## 2015-08-12 DIAGNOSIS — I10 Essential (primary) hypertension: Secondary | ICD-10-CM | POA: Diagnosis not present

## 2015-08-12 DIAGNOSIS — S82201D Unspecified fracture of shaft of right tibia, subsequent encounter for closed fracture with routine healing: Secondary | ICD-10-CM | POA: Diagnosis not present

## 2015-08-12 DIAGNOSIS — W19XXXA Unspecified fall, initial encounter: Secondary | ICD-10-CM | POA: Diagnosis present

## 2015-08-12 MED ORDER — ASPIRIN 325 MG PO TABS
650.0000 mg | ORAL_TABLET | ORAL | Status: DC | PRN
  Administered 2015-08-12 – 2015-08-13 (×5): 650 mg via ORAL
  Filled 2015-08-12 (×6): qty 2

## 2015-08-12 MED ORDER — FLUTICASONE PROPIONATE 50 MCG/ACT NA SUSP
2.0000 | Freq: Every day | NASAL | Status: DC | PRN
Start: 2015-08-12 — End: 2015-08-13
  Filled 2015-08-12: qty 16

## 2015-08-12 MED ORDER — TROLAMINE SALICYLATE 10 % EX CREA
1.0000 "application " | TOPICAL_CREAM | CUTANEOUS | Status: DC | PRN
  Filled 2015-08-12: qty 85

## 2015-08-12 MED ORDER — VITAMIN D3 25 MCG (1000 UNIT) PO TABS
2000.0000 [IU] | ORAL_TABLET | Freq: Every day | ORAL | Status: DC
  Administered 2015-08-12 – 2015-08-13 (×2): 2000 [IU] via ORAL
  Filled 2015-08-12 (×2): qty 2

## 2015-08-12 MED ORDER — ASPIRIN 81 MG PO CHEW
81.0000 mg | CHEWABLE_TABLET | Freq: Every day | ORAL | Status: DC
  Administered 2015-08-12 – 2015-08-13 (×2): 81 mg via ORAL
  Filled 2015-08-12 (×2): qty 1

## 2015-08-12 MED ORDER — ADULT MULTIVITAMIN W/MINERALS CH
1.0000 | ORAL_TABLET | Freq: Every day | ORAL | Status: DC
  Administered 2015-08-12 – 2015-08-13 (×2): 1 via ORAL
  Filled 2015-08-12 (×2): qty 1

## 2015-08-12 MED ORDER — ASPIRIN 325 MG PO TABS: 325.0000 mg | ORAL_TABLET | ORAL | Status: DC | PRN

## 2015-08-12 MED ORDER — MUSCLE RUB 10-15 % EX CREA
TOPICAL_CREAM | CUTANEOUS | Status: DC | PRN
  Administered 2015-08-12 (×2): via TOPICAL
  Filled 2015-08-12: qty 85

## 2015-08-12 MED ORDER — CALCIUM CARBONATE ANTACID 500 MG PO CHEW: 750.0000 mg | CHEWABLE_TABLET | Freq: Every day | ORAL | Status: DC

## 2015-08-12 MED ORDER — CALCIUM CARBONATE ANTACID 500 MG PO CHEW
750.0000 mg | CHEWABLE_TABLET | Freq: Every day | ORAL | Status: DC
  Administered 2015-08-12: 750 mg via ORAL
  Filled 2015-08-12: qty 1.5
  Filled 2015-08-12: qty 4

## 2015-08-12 MED ORDER — AMLODIPINE BESYLATE 5 MG PO TABS
5.0000 mg | ORAL_TABLET | Freq: Every day | ORAL | Status: DC
  Administered 2015-08-13: 5 mg via ORAL
  Filled 2015-08-12 (×2): qty 1

## 2015-08-12 MED ORDER — ENSURE ENLIVE PO LIQD
237.0000 mL | Freq: Two times a day (BID) | ORAL | Status: DC
  Administered 2015-08-13: 237 mL via ORAL

## 2015-08-12 NOTE — Progress Notes (Addendum)
Progress Note   [name] [name] [id number] DOB: May 31, 1936 DOA: 08/11/2015 PCP: Henrine Screws, MD   Brief Narrative:   [name] [name] is an 79 y.o. female with a PMH of hypertension who was admitted 08/11/15 after suffering from a mechanical fall resulting in a right tibial plateau fracture.  Assessment/Plan:   Principal Problem:   Tibial plateau fracture and right glenohumoral joint effusion secondary to fall - The patient sees Dr. Durward Fortes for her shoulder injury, will consult him since it will not be possible for her to F/U in the office in 1-2 days. - SW consult for SNF, unable to walk and lives with elderly husband who is unable to assist. - Right knee immobilizer requested, ice PRN. - PT evaluation.  Active Problems:   GERD - Continue Pepcid, resume Tums.    Hypertension - Norvasc ordered.    DVT Prophylaxis - Lovenox ordered.   Family Communication/Anticipated D/C date and plan/Code Status   Family Communication: No family at the bedside.  Disposition Plan: SNF when bed available, after orthopedic consultation. Anticipated D/C date:    Code Status: Full Code.  IV Access:    Peripheral IV   Procedures and diagnostic studies:   Ct Head Wo Contrast  08/11/2015  CLINICAL DATA:  PT RECEIVED VIA EMS FOR AN TRIP AND FALL COMING INTO THE HOUSE. PT C/O A LACERATION TO THE HEAD, -LOC. RIGHT UPPER AND RIGHT KNEE PAIN. EXAM: CT HEAD WITHOUT CONTRAST TECHNIQUE: Contiguous axial images were obtained from the base of the skull through the vertex without intravenous contrast. COMPARISON:  MR 09/02/2014 FINDINGS: Atherosclerotic and physiologic intracranial calcifications. Early mineralization in bilateral basal ganglia. Small falcine lipoma. Mild diffuse atrophy. There is no evidence of acute intracranial hemorrhage, brain edema, mass lesion, acute infarction, mass effect, or midline shift. Acute infarct may be inapparent on noncontrast CT. No other intra-axial  abnormalities are seen, and the ventricles and sulci are within normal limits in size and symmetry. No abnormal extra-axial fluid collections or masses are identified. No significant calvarial abnormality. IMPRESSION: 1. Negative for bleed or other acute intracranial process. Electronically Signed   By: Lucrezia Europe M.D.   On: 08/11/2015 18:25   Ct Knee Right Wo Contrast  08/11/2015  CLINICAL DATA:  79 year old female status post fall and right knee pain and swelling. EXAM: CT OF THE right KNEE WITHOUT CONTRAST TECHNIQUE: Multidetector CT imaging of the right knee was performed according to the standard protocol. Multiplanar CT image reconstructions were also generated. COMPARISON:  Radiograph dated 08/11/2015 FINDINGS: There is a minimally depressed fracture of the medial tibial plateau. There is extension of the fracture line into the knee posterior cortex of the tibia. The bones are osteopenic which limits evaluation of fracture. No other fracture identified. A suprapatellar lipohemarthrosis noted. IMPRESSION: Minimally depressed fracture of the medial tibial plateau. Electronically Signed   By: Anner Crete M.D.   On: 08/11/2015 22:14   Dg Knee Complete 4 Views Right  08/11/2015  CLINICAL DATA:  Recent fall with right knee pain, initial encounter EXAM: RIGHT KNEE - COMPLETE 4+ VIEW COMPARISON:  None. FINDINGS: There is a minimally displaced fracture through the medial tibial plateau. A fat fluid level is noted within a moderate-sized knee joint effusion. No other fractures are seen. Degenerative changes of the patellofemoral joint are noted. IMPRESSION: Medial tibial plateau fracture with associated joint effusion and lipoarthrosis. Electronically Signed   By: Inez Catalina M.D.   On: 08/11/2015 18:17   Dg Humerus Right  08/11/2015  CLINICAL DATA:  Status post fall. Patient complains of right upper extremity pain. Prior right humeral head fracture. EXAM: RIGHT HUMERUS - 2+ VIEW COMPARISON:  CT of the  right shoulder dated 04/22/2014 FINDINGS: There is no evidence of acute fracture. Deformity of the right humeral head is likely due to prior injury. There is increased density of the glenohumeral joint, suggestive of presence of effusion. IMPRESSION: Deformity of the right humeral head likely due to prior traumatic injury. No evidence of acute displaced fracture. Possible right glenohumeral joint effusion. Electronically Signed   By: Fidela Salisbury M.D.   On: 08/11/2015 18:17     Medical Consultants:    Dr. Joni Fears, Orthopedic Surgery  Anti-Infectives:   Anti-infectives    None      Subjective:    [name] [name] has some pain in her RLE, has eased off a bit since taking ASA this morning.  Has some reflux symptoms.  No N/V.  No dyspnea.    Objective:    Filed Vitals:   08/11/15 2122 08/11/15 2245 08/11/15 2335 08/12/15 0412  BP: 149/71 136/62 142/64 134/66  Pulse: 77 69 65 75  Temp:  98 F (36.7 C) 97.9 F (36.6 C) 98.9 F (37.2 C)  TempSrc:  Oral Oral Oral  Resp: 18 20 20 20   Height:      Weight:      SpO2: 96% 97% 100% 98%    Intake/Output Summary (Last 24 hours) at 08/12/15 1042 Last data filed at 08/12/15 0948  Gross per 24 hour  Intake    240 ml  Output    750 ml  Net   -510 ml   Filed Weights   08/11/15 1731  Weight: 81.647 kg (180 lb)    Exam: Gen:  NAD Cardiovascular:  RRR, No M/R/G Respiratory:  Lungs CTAB Gastrointestinal:  Abdomen soft, NT/ND, + BS Extremities:  Right knee/RLE slightly swollen   Data Reviewed:    Labs: Basic Metabolic Panel: No results for input(s): NA, K, CL, CO2, GLUCOSE, BUN, CREATININE, CALCIUM, MG, PHOS in the last 168 hours. GFR CrCl cannot be calculated (Patient has no serum creatinine result on file.). Liver Function Tests: No results for input(s): AST, ALT, ALKPHOS, BILITOT, PROT, ALBUMIN in the last 168 hours. No results for input(s): LIPASE, AMYLASE in the last 168 hours. No results for input(s):  AMMONIA in the last 168 hours. Coagulation profile No results for input(s): INR, PROTIME in the last 168 hours.  CBC: No results for input(s): WBC, NEUTROABS, HGB, HCT, MCV, PLT in the last 168 hours. Cardiac Enzymes: No results for input(s): CKTOTAL, CKMB, CKMBINDEX, TROPONINI in the last 168 hours. BNP (last 3 results) No results for input(s): PROBNP in the last 8760 hours. CBG: No results for input(s): GLUCAP in the last 168 hours. D-Dimer: No results for input(s): DDIMER in the last 72 hours. Hgb A1c: No results for input(s): HGBA1C in the last 72 hours. Lipid Profile: No results for input(s): CHOL, HDL, LDLCALC, TRIG, CHOLHDL, LDLDIRECT in the last 72 hours. Thyroid function studies: No results for input(s): TSH, T4TOTAL, T3FREE, THYROIDAB in the last 72 hours.  Invalid input(s): FREET3 Anemia work up: No results for input(s): VITAMINB12, FOLATE, FERRITIN, TIBC, IRON, RETICCTPCT in the last 72 hours. Sepsis Labs: No results for input(s): PROCALCITON, WBC, LATICACIDVEN in the last 168 hours. Microbiology No results found for this or any previous visit (from the past 240 hour(s)).   Medications:   . aspirin  81 mg Oral  Daily  . calcium carbonate  1 tablet Oral Daily  . enoxaparin (LOVENOX) injection  40 mg Subcutaneous Q24H  . famotidine  20 mg Oral Daily  . feeding supplement (ENSURE ENLIVE)  237 mL Oral BID BM  . multivitamin with minerals  1 tablet Oral Daily  . Vitamin D3  1 tablet Oral Daily   Continuous Infusions:   Time spent: 25 minutes.     [facility] Pager [phone]. If unable to reach me by pager, please call my cell phone at [phone].  *Please refer to [email or web address], password [email or web address] to get updated schedule on who will round on this patient, as hospitalists switch teams weekly. If 7PM-7AM, please contact night-coverage at [email or web address], password [email or web address] for any overnight needs.  08/12/2015, 10:42 AM

## 2015-08-12 NOTE — Clinical Social Work Note (Signed)
Clinical Social Work Assessment  Patient Details  Name: [name] [name] MRN: [id number] Date of Birth: 1936-04-03  Date of referral:  08/12/15               Reason for consult:  Facility Placement, Discharge Planning                Permission sought to share information with:  Chartered certified accountant granted to share information::  Yes, Verbal Permission Granted  Name::        Agency::     Relationship::     Contact Information:     Housing/Transportation Living arrangements for the past 2 months:  Single Family Home Source of Information:  Patient Patient Interpreter Needed:  None Criminal Activity/Legal Involvement Pertinent to Current Situation/Hospitalization:  No - Comment as needed Significant Relationships:  Spouse Lives with:  Spouse Do you feel safe going back to the place where you live?   (Possible SNF placement.) Need for family participation in patient care:  Yes (Comment)  Care giving concerns : Spouse may not be able to manage pt's care at home following hospital d/c.   Social Worker assessment / plan:  Pt hospitalized on 08/11/15 due to a fall resulting in  A right tibial plateau fx. Surgery is not required. CSW met with pt / spouse to assist with d/c planning. Ortho orders and PT recommendations are pending. ST Rehab placement may be needed. Pt is considering this option. SNF search initiated and bed offers pending. CSW will meet again with pt following PT eval to continue assisting with d/c planning needs.  Employment status:  Retired Nurse, adult PT Recommendations:  Not assessed at this time Information / Referral to community resources:  [place]  Patient/Family's Response to care: Disposition to be determined.  Patient/Family's Understanding of and Emotional Response to Diagnosis, Current Treatment, and Prognosis:  Pt is waiting for Ortho orders and PT evaluation. Pt reports that she would like to  return home but has limited support. Pt reports that she may need ST Rehab.  Emotional Assessment Appearance:  Appears stated age Attitude/Demeanor/Rapport:  Other (cooperative) Affect (typically observed):  Anxious Orientation:  Oriented to Self, Oriented to Place, Oriented to  Time, Oriented to Situation Alcohol / Substance use:  Not Applicable Psych involvement (Current and /or in the community):  No (Comment)  Discharge Needs  Concerns to be addressed:  Discharge Planning Concerns Readmission within the last 30 days:  No Current discharge risk:  None Barriers to Discharge:  No Barriers Identified   Luretha Rued, [place] 08/12/2015, 1:27 PM

## 2015-08-12 NOTE — NC FL2 (Signed)
[place] LEVEL OF CARE SCREENING TOOL     IDENTIFICATION  Patient Name: [name] [name] Birthdate: Jan 14, 1936 Sex: female Admission Date (Current Location): 08/11/2015  [facility] and [place] Number:     Facility and Address:  [facility],  [place] [address], [place]      Provider Number: [id number]  Attending Physician Name and Address:  Venetia Maxon Rama, MD  Relative Name and Phone Number:       Current Level of Care: Hospital Recommended Level of Care: [place] Prior Approval Number:    Date Approved/Denied:   PASRR Number: [id number]  Discharge Plan: SNF    Current Diagnoses: Patient Active Problem List   Diagnosis Date Noted  . Fall 08/12/2015  . Tibial plateau fracture 08/11/2015  . Cardiac device in situ   . Second degree Mobitz II AV block   . Hypertension     Orientation ACTIVITIES/SOCIAL BLADDER RESPIRATION    Self, Time, Situation, Place  Passive Continent Normal  BEHAVIORAL SYMPTOMS/MOOD NEUROLOGICAL BOWEL NUTRITION STATUS  Other (Comment) (no behaviors)   Continent Diet (Heart Healthy)  PHYSICIAN VISITS COMMUNICATION OF NEEDS Height & Weight Skin    Verbally 5\' 6"  (167.6 cm) 180 lbs.  (Head laceration due to fall.)          AMBULATORY STATUS RESPIRATION    Assist extensive Normal      Personal Care Assistance Level of Assistance  Bathing, Dressing Bathing Assistance: Limited assistance   Dressing Assistance: Limited assistance      Functional Limitations Info  Sight, Hearing, Speech Sight Info: Adequate Hearing Info: Adequate Speech Info: Adequate       SPECIAL CARE FACTORS FREQUENCY  PT (By licensed PT), OT (By licensed OT)     PT Frequency: 5 x wk OT Frequency: 5 x wk           Additional Factors Info  Code Status, Psychotropic, Allergies Code Status Info: Full Code             Current Medications (08/12/2015):  This is the current hospital active medication  list Current Facility-Administered Medications  Medication Dose Route Frequency Provider Last Rate Last Dose  . ALPRAZolam Duanne Moron) tablet 0.125 mg  0.125 mg Oral Daily PRN Gennaro Africa, MD      . amLODipine (NORVASC) tablet 5 mg  5 mg Oral Daily Venetia Maxon Rama, MD   5 mg at 08/12/15 1115  . aspirin chewable tablet 81 mg  81 mg Oral Daily Venetia Maxon Rama, MD   81 mg at 08/12/15 1147  . aspirin tablet 650 mg  650 mg Oral Q4H PRN Venetia Maxon Rama, MD   650 mg at 08/12/15 1225  . calcium carbonate (TUMS - dosed in mg elemental calcium) chewable tablet 750 mg  750 mg Oral QHS Christina P Rama, MD      . cholecalciferol (VITAMIN D) tablet 2,000 Units  2,000 Units Oral Daily Venetia Maxon Rama, MD   2,000 Units at 08/12/15 1147  . enoxaparin (LOVENOX) injection 40 mg  40 mg Subcutaneous Q24H Gennaro Africa, MD   40 mg at 08/12/15 0831  . famotidine (PEPCID) tablet 20 mg  20 mg Oral Daily Gennaro Africa, MD   20 mg at 08/12/15 0848  . feeding supplement (ENSURE ENLIVE) (ENSURE ENLIVE) liquid 237 mL  237 mL Oral BID BM Gennaro Africa, MD   237 mL at 08/12/15 1000  . fluticasone (FLONASE) 50 MCG/ACT nasal spray 2 spray  2 spray Each  Nare Daily PRN Venetia Maxon Rama, MD      . morphine 2 MG/ML injection 1 mg  1 mg Intravenous Q3H PRN Gennaro Africa, MD      . multivitamin with minerals tablet 1 tablet  1 tablet Oral Daily Venetia Maxon Rama, MD   1 tablet at 08/12/15 1147  . MUSCLE RUB CREA   Topical PRN Venetia Maxon Rama, MD         Discharge Medications: Please see discharge summary for a list of discharge medications.  Relevant Imaging Results:  Relevant Lab Results:  Recent Labs    Additional Information SS # [id number]  [name] [name], [name], LCSW

## 2015-08-12 NOTE — Care Management Obs Status (Signed)
[place] NOTIFICATION   Patient Details  Name: [name] [name] MRN: [id number] Date of Birth: Nov 24, 1935   Medicare Observation Status Notification Given:  Yes    Guadalupe Maple, RN 08/12/2015, 2:33 PM

## 2015-08-12 NOTE — Care Management Note (Signed)
Case Management Note  Patient Details  Name: Jayceonna Monce MRN: [id number] Date of Birth: [birth year]  Subjective/Objective:    Admitted with Tibial plateau fracture and right glenohumoral joint effusion secondary to fall                Action/Plan: Discharge planning, spoke with patient at bedside. Awaiting PT evaluations to determine home vs SNF.   Expected Discharge Date:   (unknown)               Expected Discharge Plan:  [place]  In-House Referral:  Clinical Social Work  Discharge planning Services  CM Consult  Post Acute Care Choice:  Home Health Choice offered to:  Patient  DME Arranged:  N/A DME Agency:  NA  HH Arranged:  PT [place] Agency:  [place]  Status of Service:  Completed, signed off  Medicare Important Message Given:    Date Medicare IM Given:    Medicare IM give by:    Date Additional Medicare IM Given:    Additional Medicare Important Message give by:     If discussed at [place] of Stay Meetings, dates discussed:    Additional Comments:  Guadalupe Maple, RN 08/12/2015, 2:32 PM

## 2015-08-12 NOTE — Progress Notes (Signed)
PT Cancellation Note  Patient Details Name: Stana Wakeford MRN: [id number] DOB: [birth year]   Cancelled Treatment:    Reason Eval/Treat Not Completed: Other (comment) (await further clarification for  use of KI as per ED note per ortho oncall. no orders  for KI  nor WBS. Unsure if Ortho consult  Will be done today.  Claretha Cooper 08/12/2015, 9:29 AM Tresa Endo PT [phone]

## 2015-08-12 NOTE — Clinical Social Work Placement (Signed)
   CLINICAL SOCIAL WORK PLACEMENT  NOTE  Date:  08/12/2015  Patient Details  Name: [name] [name] MRN: [id number] Date of Birth: 08/24/1936  Clinical Social Work is seeking post-discharge placement for this patient at the [place] level of care (*CSW will initial, date and re-position this form in  chart as items are completed):  Yes   Patient/family provided with [facility] Work Department's list of facilities offering this level of care within the geographic area requested by the patient (or if unable, by the patient's family).  Yes   Patient/family informed of their freedom to choose among providers that offer the needed level of care, that participate in Medicare, Medicaid or managed care program needed by the patient, have an available bed and are willing to accept the patient.  Yes   Patient/family informed of [facility]'s ownership interest in [facility] and [facility], as well as of the fact that they are under no obligation to receive care at these facilities.  PASRR submitted to EDS on 08/12/15     PASRR number received on 08/12/15     Existing PASRR number confirmed on       FL2 transmitted to all facilities in geographic area requested by pt/family on 08/12/15     FL2 transmitted to all facilities within larger geographic area on       Patient informed that his/her managed care company has contracts with or will negotiate with certain facilities, including the following:            Patient/family informed of bed offers received.  Patient chooses bed at       Physician recommends and patient chooses bed at      Patient to be transferred to   on  .  Patient to be transferred to facility by       Patient family notified on   of transfer.  Name of family member notified:        PHYSICIAN       Additional Comment:    _______________________________________________ Luretha Rued, Dickson 08/12/2015, 1:35  PM

## 2015-08-13 ENCOUNTER — Observation Stay (HOSPITAL_COMMUNITY): Payer: Medicare Other

## 2015-08-13 DIAGNOSIS — I10 Essential (primary) hypertension: Secondary | ICD-10-CM

## 2015-08-13 DIAGNOSIS — S82141A Displaced bicondylar fracture of right tibia, initial encounter for closed fracture: Secondary | ICD-10-CM | POA: Diagnosis not present

## 2015-08-13 DIAGNOSIS — W19XXXD Unspecified fall, subsequent encounter: Secondary | ICD-10-CM

## 2015-08-13 LAB — CBC WITH DIFFERENTIAL/PLATELET
Basophils Absolute: 0 10*3/uL (ref 0.0–0.1)
Basophils Relative: 0 %
Eosinophils Absolute: 0.1 10*3/uL (ref 0.0–0.7)
Eosinophils Relative: 1 %
HEMATOCRIT: 43.9 % (ref 36.0–46.0)
HEMOGLOBIN: 14.3 g/dL (ref 12.0–15.0)
LYMPHS ABS: 1.6 10*3/uL (ref 0.7–4.0)
LYMPHS PCT: 16 %
MCH: 29.6 pg (ref 26.0–34.0)
MCHC: 32.6 g/dL (ref 30.0–36.0)
MCV: 90.9 fL (ref 78.0–100.0)
MONOS PCT: 10 %
Monocytes Absolute: 1 10*3/uL (ref 0.1–1.0)
NEUTROS ABS: 7.5 10*3/uL (ref 1.7–7.7)
NEUTROS PCT: 73 %
Platelets: 354 10*3/uL (ref 150–400)
RBC: 4.83 MIL/uL (ref 3.87–5.11)
RDW: 14.1 % (ref 11.5–15.5)
WBC: 10.3 10*3/uL (ref 4.0–10.5)

## 2015-08-13 LAB — COMPREHENSIVE METABOLIC PANEL
ALBUMIN: 4 g/dL (ref 3.5–5.0)
ALT: 17 U/L (ref 14–54)
AST: 24 U/L (ref 15–41)
Alkaline Phosphatase: 134 U/L — ABNORMAL HIGH (ref 38–126)
Anion gap: 9 (ref 5–15)
BILIRUBIN TOTAL: 0.7 mg/dL (ref 0.3–1.2)
BUN: 15 mg/dL (ref 6–20)
CHLORIDE: 104 mmol/L (ref 101–111)
CO2: 25 mmol/L (ref 22–32)
Calcium: 9.5 mg/dL (ref 8.9–10.3)
Creatinine, Ser: 0.78 mg/dL (ref 0.44–1.00)
GFR calc Af Amer: >60 mL/min (ref >60)
GFR calc non Af Amer: >60 mL/min (ref >60)
GLUCOSE: 103 mg/dL — AB (ref 65–99)
POTASSIUM: 4.3 mmol/L (ref 3.5–5.1)
Sodium: 138 mmol/L (ref 135–145)
TOTAL PROTEIN: 8.2 g/dL — AB (ref 6.5–8.1)

## 2015-08-13 MED ORDER — HYDROCODONE-ACETAMINOPHEN 5-325 MG PO TABS: 1.0000 | ORAL_TABLET | Freq: Four times a day (QID) | ORAL | Status: DC | PRN

## 2015-08-13 MED ORDER — ENSURE ENLIVE PO LIQD: 237.0000 mL | Freq: Two times a day (BID) | ORAL | Status: DC

## 2015-08-13 MED ORDER — ENOXAPARIN SODIUM 40 MG/0.4ML ~~LOC~~ SOLN: 40.0000 mg | SUBCUTANEOUS | Status: DC

## 2015-08-13 NOTE — Consult Note (Signed)
Reason for Consult: right tibial plateau fracture Referring Physician:Patel  [name] [name] is an 79 y.o. female.  HPI: Patient reports mechanical fall without dizziness, chest pain or LOC. Complaining only of right knee and ankle pain.   Past Medical History  Diagnosis Date  . Hypertension   . Broken arm Right  . Complete heart block ([place])   . Anxiety   . Postural dizziness     Past Surgical History  Procedure Laterality Date  . Ep implantable device N/A 01/16/2015    MDT Adapta L PPM implanted by Dr Rayann Heman for complete heart block    Family History  Problem Relation Age of Onset  . Heart failure Mother   . Hypertension Mother   . Hypertension Father   . Atrial fibrillation Father     Social History:  reports that she quit smoking about 28 years ago. Her smoking use included Cigarettes. She does not have any smokeless tobacco history on file. She reports that she does not drink alcohol or use illicit drugs.  Allergies:  Allergies  Allergen Reactions  . Hctz [Hydrochlorothiazide] Shortness Of Breath and Other (See Comments)    Very short winded, made her hurt all over   . Losartan Other (See Comments)    Skin crawling feelings  . Macrolides And Ketolides Other (See Comments)    Pyloric sphincter flareups-unsure of which -mycins  . Benazepril Other (See Comments)    Doesn't work for patient  . Penicillins Other (See Comments)    abd pain Has patient had a PCN reaction causing immediate rash, facial/tongue/throat swelling, SOB or lightheadedness with hypotension: No Has patient had a PCN reaction causing severe rash involving mucus membranes or skin necrosis: No Has patient had a PCN reaction that required hospitalization No Has patient had a PCN reaction occurring within the last 10 years: No If all of the above answers are "NO", then may proceed with Cephalosporin use.      Medications: I have reviewed the patient's current medications.  Results for orders placed  or performed during the hospital encounter of 08/11/15 (from the past 48 hour(s))  CBC with Differential/Platelet     Status: None   Collection Time: 08/13/15 11:31 AM  Result Value Ref Range   WBC 10.3 4.0 - 10.5 K/uL   RBC 4.83 3.87 - 5.11 MIL/uL   Hemoglobin 14.3 12.0 - 15.0 g/dL   HCT 43.9 36.0 - 46.0 %   MCV 90.9 78.0 - 100.0 fL   MCH 29.6 26.0 - 34.0 pg   MCHC 32.6 30.0 - 36.0 g/dL   RDW 14.1 11.5 - 15.5 %   Platelets 354 150 - 400 K/uL   Neutrophils Relative % 73 %   Neutro Abs 7.5 1.7 - 7.7 K/uL   Lymphocytes Relative 16 %   Lymphs Abs 1.6 0.7 - 4.0 K/uL   Monocytes Relative 10 %   Monocytes Absolute 1.0 0.1 - 1.0 K/uL   Eosinophils Relative 1 %   Eosinophils Absolute 0.1 0.0 - 0.7 K/uL   Basophils Relative 0 %   Basophils Absolute 0.0 0.0 - 0.1 K/uL  Comprehensive metabolic panel     Status: Abnormal   Collection Time: 08/13/15 11:31 AM  Result Value Ref Range   Sodium 138 135 - 145 mmol/L   Potassium 4.3 3.5 - 5.1 mmol/L   Chloride 104 101 - 111 mmol/L   CO2 25 22 - 32 mmol/L   Glucose, Bld 103 (H) 65 - 99 mg/dL   BUN 15  6 - 20 mg/dL   Creatinine, Ser 0.78 0.44 - 1.00 mg/dL   Calcium 9.5 8.9 - 10.3 mg/dL   Total Protein 8.2 (H) 6.5 - 8.1 g/dL   Albumin 4.0 3.5 - 5.0 g/dL   AST 24 15 - 41 U/L   ALT 17 14 - 54 U/L   Alkaline Phosphatase 134 (H) 38 - 126 U/L   Total Bilirubin 0.7 0.3 - 1.2 mg/dL   GFR calc non Af Amer >60 >60 mL/min   GFR calc Af Amer >60 >60 mL/min    Comment: (NOTE) The eGFR has been calculated using the CKD EPI equation. This calculation has not been validated in all clinical situations. eGFR's persistently <60 mL/min signify possible Chronic Kidney Disease.    Anion gap 9 5 - 15    Ct Head Wo Contrast  08/11/2015  CLINICAL DATA:  PT RECEIVED VIA EMS FOR AN TRIP AND FALL COMING INTO THE HOUSE. PT C/O A LACERATION TO THE HEAD, -LOC. RIGHT UPPER AND RIGHT KNEE PAIN. EXAM: CT HEAD WITHOUT CONTRAST TECHNIQUE: Contiguous axial images were  obtained from the base of the skull through the vertex without intravenous contrast. COMPARISON:  MR 09/02/2014 FINDINGS: Atherosclerotic and physiologic intracranial calcifications. Early mineralization in bilateral basal ganglia. Small falcine lipoma. Mild diffuse atrophy. There is no evidence of acute intracranial hemorrhage, brain edema, mass lesion, acute infarction, mass effect, or midline shift. Acute infarct may be inapparent on noncontrast CT. No other intra-axial abnormalities are seen, and the ventricles and sulci are within normal limits in size and symmetry. No abnormal extra-axial fluid collections or masses are identified. No significant calvarial abnormality. IMPRESSION: 1. Negative for bleed or other acute intracranial process. Electronically Signed   By: Lucrezia Europe M.D.   On: 08/11/2015 18:25   Ct Knee Right Wo Contrast  08/11/2015  CLINICAL DATA:  79 year old female status post fall and right knee pain and swelling. EXAM: CT OF THE right KNEE WITHOUT CONTRAST TECHNIQUE: Multidetector CT imaging of the right knee was performed according to the standard protocol. Multiplanar CT image reconstructions were also generated. COMPARISON:  Radiograph dated 08/11/2015 FINDINGS: There is a minimally depressed fracture of the medial tibial plateau. There is extension of the fracture line into the knee posterior cortex of the tibia. The bones are osteopenic which limits evaluation of fracture. No other fracture identified. A suprapatellar lipohemarthrosis noted. IMPRESSION: Minimally depressed fracture of the medial tibial plateau. Electronically Signed   By: Anner Crete M.D.   On: 08/11/2015 22:14   Dg Knee Complete 4 Views Right  08/11/2015  CLINICAL DATA:  Recent fall with right knee pain, initial encounter EXAM: RIGHT KNEE - COMPLETE 4+ VIEW COMPARISON:  None. FINDINGS: There is a minimally displaced fracture through the medial tibial plateau. A fat fluid level is noted within a moderate-sized  knee joint effusion. No other fractures are seen. Degenerative changes of the patellofemoral joint are noted. IMPRESSION: Medial tibial plateau fracture with associated joint effusion and lipoarthrosis. Electronically Signed   By: Inez Catalina M.D.   On: 08/11/2015 18:17   Dg Humerus Right  08/11/2015  CLINICAL DATA:  Status post fall. Patient complains of right upper extremity pain. Prior right humeral head fracture. EXAM: RIGHT HUMERUS - 2+ VIEW COMPARISON:  CT of the right shoulder dated 04/22/2014 FINDINGS: There is no evidence of acute fracture. Deformity of the right humeral head is likely due to prior injury. There is increased density of the glenohumeral joint, suggestive of presence of effusion.  IMPRESSION: Deformity of the right humeral head likely due to prior traumatic injury. No evidence of acute displaced fracture. Possible right glenohumeral joint effusion. Electronically Signed   By: Fidela Salisbury M.D.   On: 08/11/2015 18:17    Review of Systems  Musculoskeletal:       Right knee and ankle pain s/p fall  Neurological: Negative for dizziness, loss of consciousness and weakness.  All other systems reviewed and are negative.  Blood pressure 142/51, pulse 70, temperature 97.8 F (36.6 C), temperature source Oral, resp. rate 18, height $RemoveBe'5\' 6"'daMINCSUh$  (1.676 m), weight 81.647 kg (180 lb), SpO2 98 %. Physical Exam  Constitutional: She appears well-developed and well-nourished.  HENT:  Head: Normocephalic.  Staples frontal midline of scalp  Eyes: EOM are normal.  Cardiovascular: Normal rate and intact distal pulses.   Respiratory: Effort normal.  Musculoskeletal:  Swelling right knee no frank effusion. Tenderness medial proximal tibia non tender over lateral joint line. Able to perform a straight leg raise. Calf supple non tender. Tenderness over medial right ankle just proximal to lateral malleolus. Right leg without rashes or ulcerations.    Skin: Skin is warm and dry.  Psychiatric:  She has a normal mood and affect.    Assessment/Plan: Status post fall without LOC or dizziness Right tibial plateau fracture with minimal depression medial plateau.  Right distal tibia pain and tenderness. Will get radiographs of ankle to r/o fracture. Patient non weight bearing right lower extremity in Bledsoe brace locked in full extension. May remove for bathing. No surgical intervention right tibial plateau fracture.  If radiographs for ankle negative for fracture patient can be discharged to SNF today and follow up with Dr. Ninfa Linden in office in 2 weeks. Encourage patient to wiggle toes and flex ankle periodically during the day. Lovenox for DVT prophylaxis while non weight bearing. Will check ankle films later today. Thanks for consult.  Erskine Emery 08/13/2015, 12:40 PM

## 2015-08-13 NOTE — Evaluation (Signed)
Physical Therapy Evaluation Patient Details Name: [name] [name] MRN: [id number] DOB: [birth year] Today's Date: 08/13/2015   History of Present Illness   patient fell on steps at home on 08/11/15, sustained Tibial plateau fracture on R. Orthopedics recommned Bledsoe brace and NWB on R.  Clinical Impression  Pt admitted with above diagnosis. Pt currently with functional limitations due to the deficits listed below (see PT Problem List).  Pt will benefit from skilled PT to increase their independence and safety with mobility to allow discharge to the venue listed below.    Patient encouraged to consider SNF.patient's spouse is not able to assist.     Follow Up Recommendations SNF;Supervision/Assistance - 24 hour    Equipment Recommendations  None recommended by PT    Recommendations for Other Services       Precautions / Restrictions Precautions Precautions: Fall;Knee Required Braces or Orthoses: Other Brace/Splint Other Brace/Splint: bledsoe Restrictions Weight Bearing Restrictions: Yes RLE Weight Bearing: Non weight bearing      Mobility  Bed Mobility Overal bed mobility: Needs Assistance Bed Mobility: Supine to Sit     Supine to sit: Mod assist     General bed mobility comments: support the R leg  Transfers Overall transfer level: Needs assistance Equipment used: Rolling walker (2 wheeled) Transfers: Sit to/from [facility] Sit to Stand: +2 safety/equipment;Mod assist Stand pivot transfers: +2 safety/equipment       General transfer comment: Cues for NWB as PT had not heard  from orthopedics recs, unable to hop, scooted on the L foot to pivot to recliner  Ambulation/Gait                Stairs            Wheelchair Mobility    Modified Rankin (Stroke Patients Only)       Balance Overall balance assessment: History of Falls;Needs assistance Sitting-balance support: Feet supported;Bilateral upper extremity supported Sitting  balance-Leahy Scale: Fair     Standing balance support: During functional activity;Bilateral upper extremity supported Standing balance-Leahy Scale: Poor                               Pertinent Vitals/Pain Pain Assessment: Faces Faces Pain Scale: Hurts even more Pain Location: R knee Pain Descriptors / Indicators: Aching;Discomfort;Grimacing;Guarding Pain Intervention(s): Limited activity within patient's tolerance;Monitored during session;Patient requesting pain meds-RN notified;Ice applied;Repositioned    Home Living Family/patient expects to be discharged to:: Private residence Living Arrangements: Spouse/significant other Available Help at Discharge: Family;Available 24 hours/day Type of Home: House Home Access: Stairs to enter Entrance Stairs-Rails: Right Entrance Stairs-Number of Steps: 3 Home Layout: Two level;Bed/bath upstairs Home Equipment: Latina Craver - 2 wheels Additional Comments: spouse  unble to assist. can stay on couch.    Prior Function Level of Independence: Independent               Hand Dominance        Extremity/Trunk Assessment   Upper Extremity Assessment: Generalized weakness           Lower Extremity Assessment: RLE deficits/detail RLE Deficits / Details: in KI    Cervical / Trunk Assessment: Normal  Communication   Communication: No difficulties  Cognition Arousal/Alertness: Awake/alert Behavior During Therapy: WFL for tasks assessed/performed;Anxious Overall Cognitive Status: Within Functional Limits for tasks assessed                      General Comments  Exercises        Assessment/Plan    PT Assessment Patient needs continued PT services  PT Diagnosis Difficulty walking;Acute pain;Generalized weakness   PT Problem List Decreased strength;Decreased balance;Decreased mobility;Decreased activity tolerance;Pain;Decreased safety awareness;Decreased knowledge of use of DME;Decreased  knowledge of precautions  PT Treatment Interventions DME instruction;Gait training;Functional mobility training;Therapeutic activities;Therapeutic exercise;Patient/family education   PT Goals (Current goals can be found in the Care Plan section) Acute Rehab PT Goals Patient Stated Goal: to go home PT Goal Formulation: With patient Time For Goal Achievement: 08/27/15 Potential to Achieve Goals: Good    Frequency Min 3X/week   Barriers to discharge Decreased caregiver support;Inaccessible home environment      Co-evaluation               End of Session Equipment Utilized During Treatment: Gait belt;Right knee immobilizer Activity Tolerance: Patient tolerated treatment well Patient left: in chair;with call bell/phone within reach;with chair alarm set Nurse Communication: Mobility status    Functional Assessment Tool Used: clinical judgement Functional Limitation: Mobility: Walking and moving around Mobility: Walking and Moving Around Current Status [id number]): At least 60 percent but less than 80 percent impaired, limited or restricted Mobility: Walking and Moving Around Goal Status [phone]): At least 1 percent but less than 20 percent impaired, limited or restricted    Time: 1026-1051 PT Time Calculation (min) (ACUTE ONLY): 25 min   Charges:   PT Evaluation $Initial PT Evaluation Tier I: 1 Procedure PT Treatments $Therapeutic Activity: 8-22 mins   PT G Codes:   PT G-Codes **NOT FOR INPATIENT CLASS** Functional Assessment Tool Used: clinical judgement Functional Limitation: Mobility: Walking and moving around Mobility: Walking and Moving Around Current Status [id number]): At least 60 percent but less than 80 percent impaired, limited or restricted Mobility: Walking and Moving Around Goal Status [phone]): At least 1 percent but less than 20 percent impaired, limited or restricted    Claretha Cooper 08/13/2015, 3:46 PM Tresa Endo PT [phone]

## 2015-08-13 NOTE — Discharge Instructions (Signed)
Non weight bearing right lower extremity Bledsoe brace right leg at all times except for bathing. Brace locked in full extension.

## 2015-08-13 NOTE — Progress Notes (Signed)
Report called to Alex at [facility].

## 2015-08-13 NOTE — Discharge Summary (Signed)
[facility] Discharge Summary   Patient: [name] [name]    [id number] PCP: Henrine Screws, MD    DOB: 09-18-1935 Date of admission: 08/11/2015  Date of discharge: 08/13/2015   Discharge Diagnoses:  Principal Problem:   Tibial plateau fracture Active Problems:   Hypertension   Fall   Recommendations for Outpatient Follow-up:  1. Follow-up with orthopedic as well as PCP as directed   Diet recommendation: Regular diet  Activity: Nonweightbearing right knee until seen by Dr. Ninfa Linden  Discharge Condition: good  History of present illness: As per the H&P dictated on 08/11/2015 the patient presented with a mechanical fall. She did not have any dizziness or passing out episode. She fell on her knee and also had head injury. Patient was unable to bear any weight on the right leg and had difficulty ambulating at home and therefore recommendation was to admit her to the hospital.  Hospital Course:  Patient presented to the hospital on 08/11/2015 after a mechanical fall. Workup in the ER including CT head as well as right humerus x-ray was negative for any acute abnormality. Right knee CT scan was showing minimally depressed tibial plateau fracture. Orthopedic was consulted and recommended conservative management with knee immobilizer for the patient. Patient had scalp laceration and had 2 staples placed on 08/11/2015 which will need to be removed in 7 days on 08/18/2015. Patient's pain was well-controlled with Norco as well as knee immobilizer. Patient was able to tolerate oral diet and did not have any nausea or vomiting. Patient was recommended to continue taking Lovenox while she is nonweightbearing for DVT prophylaxis. Patient will follow up with Dr. Ninfa Linden from orthopedic in 2 weeks and further anticoagulation as well as weightbearing status will be decided on that follow-up.  All other chronic medical condition were stable during the hospitalization.  Placement at  [facility] was arranged by Education officer, museum and case Freight forwarder. On the day of the discharge the patient's pain was well-controlled, and no other acute medical condition were reported by patient. the patient was felt safe to be discharge at [facility].  Procedures and Results:  None    Consultations:  Orthopedic Dr. Ninfa Linden   Discharge Exam: Filed Weights   08/11/15 1731  Weight: 81.647 kg (180 lb)   Filed Vitals:   08/13/15 0442 08/13/15 1321  BP: 142/51 137/60  Pulse: 70 77  Temp: 97.8 F (36.6 C) 97.5 F (36.4 C)  Resp:  19   General: alert awake and oriented not in distress  Cardiovascular: S1 and S2 present regular Respiratory: clear to auscultation Abdomen: bowel sounds present nontender   DISCHARGE MEDICATION:  Current Discharge Medication List    START taking these medications   Details  enoxaparin (LOVENOX) 40 MG/0.4ML injection Inject 0.4 mLs (40 mg total) into the skin daily. Qty: 0 Syringe    feeding supplement, ENSURE ENLIVE, (ENSURE ENLIVE) LIQD Take 237 mLs by mouth 2 (two) times daily between meals. Qty: 237 mL, Refills: 12    HYDROcodone-acetaminophen (NORCO) 5-325 MG tablet Take 1 tablet by mouth every 6 (six) hours as needed for moderate pain. Qty: 30 tablet, Refills: 0      CONTINUE these medications which have NOT CHANGED   Details  ALPRAZolam (XANAX) 0.25 MG tablet Take 0.125 tablets by mouth daily as needed for anxiety.     amLODipine (NORVASC) 5 MG tablet Take 5-7.5 mg by mouth daily. May take an additional 2.5mg  as needed for unusually high blood pressure    aspirin 325  MG tablet Take 650 mg by mouth daily as needed (pain).    calcium carbonate (TUMS EX) 750 MG chewable tablet Chew 1 tablet by mouth daily.    Cholecalciferol (VITAMIN D3) 2000 UNITS TABS Take 1 tablet by mouth daily.    famotidine (PEPCID) 20 MG tablet Take 20 mg by mouth daily.    fluticasone (FLONASE) 50 MCG/ACT nasal spray Place 2 sprays into both nostrils  daily as needed for allergies.     guaiFENesin (ROBITUSSIN) 100 MG/5ML SOLN Take 5 mLs by mouth every 4 (four) hours as needed for cough or to loosen phlegm.    Multiple Vitamins-Minerals (ICAPS MV PO) Take 1 tablet by mouth daily.      STOP taking these medications     trolamine salicylate (ASPERCREME) 10 % cream        Allergies  Allergen Reactions  . Hctz [Hydrochlorothiazide] Shortness Of Breath and Other (See Comments)    Very short winded, made her hurt all over   . Losartan Other (See Comments)    Skin crawling feelings  . Macrolides And Ketolides Other (See Comments)    Pyloric sphincter flareups-unsure of which -mycins  . Benazepril Other (See Comments)    Doesn't work for patient  . Penicillins Other (See Comments)    abd pain Has patient had a PCN reaction causing immediate rash, facial/tongue/throat swelling, SOB or lightheadedness with hypotension: No Has patient had a PCN reaction causing severe rash involving mucus membranes or skin necrosis: No Has patient had a PCN reaction that required hospitalization No Has patient had a PCN reaction occurring within the last 10 years: No If all of the above answers are "NO", then may proceed with Cephalosporin use.     Follow-up Information    Follow up with GATES,ROBERT NEVILL, MD In 1 week.   Specialty:  Internal Medicine   Why:  For staple removal   Contact information:   [address] [place] [address] [phone]       Follow up with [facility] .   Specialty:  Skilled Nursing Facility   Contact information:   [place] [phone]      Follow up with Mcarthur Rossetti, MD.   Specialty:  Orthopedic Surgery   Contact information:   [place] [address] [phone]       Follow up with GATES,ROBERT NEVILL, MD. Call in 1 week.   Specialty:  Internal Medicine   Contact information:   [address]  [place] [address] [phone]       The results of significant diagnostics from this hospitalization (including imaging, microbiology, ancillary and laboratory) are listed below for reference.    Significant Diagnostic Studies: Dg Ankle Complete Right  08/13/2015  CLINICAL DATA:  79 year old female with recent tibial plateau fracture from fall. Pain radiating to the medial malleolus. Initial encounter. EXAM: RIGHT ANKLE - COMPLETE 3+ VIEW COMPARISON:  08/11/2015 right knee CT. Previous right ankle series 10514. FINDINGS: There is a focal soft tissue wound along the medial distal left tibia (image 2). No subcutaneous gas. Osteopenia. Chronic posttraumatic or degenerative spurring of the medial malleolus. No acute fracture of the distal tibia identified. Mortise joint alignment preserved. No ankle joint effusion. Distal fibula, talar dome, and calcaneus appear intact. IMPRESSION: 1. No acute fracture or dislocation identified about the right ankle. 2. Soft tissue wound near the medial malleolus. Electronically Signed  By: Genevie Ann M.D.   On: 08/13/2015 15:17   Ct Head Wo Contrast  08/11/2015  CLINICAL DATA:  PT RECEIVED VIA EMS FOR AN TRIP AND FALL COMING INTO THE HOUSE. PT C/O A LACERATION TO THE HEAD, -LOC. RIGHT UPPER AND RIGHT KNEE PAIN. EXAM: CT HEAD WITHOUT CONTRAST TECHNIQUE: Contiguous axial images were obtained from the base of the skull through the vertex without intravenous contrast. COMPARISON:  MR 09/02/2014 FINDINGS: Atherosclerotic and physiologic intracranial calcifications. Early mineralization in bilateral basal ganglia. Small falcine lipoma. Mild diffuse atrophy. There is no evidence of acute intracranial hemorrhage, brain edema, mass lesion, acute infarction, mass effect, or midline shift. Acute infarct may be inapparent on noncontrast CT. No other intra-axial abnormalities are seen, and the ventricles and sulci are within normal limits in size and  symmetry. No abnormal extra-axial fluid collections or masses are identified. No significant calvarial abnormality. IMPRESSION: 1. Negative for bleed or other acute intracranial process. Electronically Signed   By: Lucrezia Europe M.D.   On: 08/11/2015 18:25   Ct Knee Right Wo Contrast  08/11/2015  CLINICAL DATA:  79 year old female status post fall and right knee pain and swelling. EXAM: CT OF THE right KNEE WITHOUT CONTRAST TECHNIQUE: Multidetector CT imaging of the right knee was performed according to the standard protocol. Multiplanar CT image reconstructions were also generated. COMPARISON:  Radiograph dated 08/11/2015 FINDINGS: There is a minimally depressed fracture of the medial tibial plateau. There is extension of the fracture line into the knee posterior cortex of the tibia. The bones are osteopenic which limits evaluation of fracture. No other fracture identified. A suprapatellar lipohemarthrosis noted. IMPRESSION: Minimally depressed fracture of the medial tibial plateau. Electronically Signed   By: Anner Crete M.D.   On: 08/11/2015 22:14   Dg Knee Complete 4 Views Right  08/11/2015  CLINICAL DATA:  Recent fall with right knee pain, initial encounter EXAM: RIGHT KNEE - COMPLETE 4+ VIEW COMPARISON:  None. FINDINGS: There is a minimally displaced fracture through the medial tibial plateau. A fat fluid level is noted within a moderate-sized knee joint effusion. No other fractures are seen. Degenerative changes of the patellofemoral joint are noted. IMPRESSION: Medial tibial plateau fracture with associated joint effusion and lipoarthrosis. Electronically Signed   By: Inez Catalina M.D.   On: 08/11/2015 18:17   Dg Humerus Right  08/11/2015  CLINICAL DATA:  Status post fall. Patient complains of right upper extremity pain. Prior right humeral head fracture. EXAM: RIGHT HUMERUS - 2+ VIEW COMPARISON:  CT of the right shoulder dated 04/22/2014 FINDINGS: There is no evidence of acute fracture. Deformity  of the right humeral head is likely due to prior injury. There is increased density of the glenohumeral joint, suggestive of presence of effusion. IMPRESSION: Deformity of the right humeral head likely due to prior traumatic injury. No evidence of acute displaced fracture. Possible right glenohumeral joint effusion. Electronically Signed   By: Fidela Salisbury M.D.   On: 08/11/2015 18:17   Labs: CBC:  Recent Labs Lab 08/13/15 1131  WBC 10.3  NEUTROABS 7.5  HGB 14.3  HCT 43.9  MCV 90.9  PLT [id number]   Basic Metabolic Panel:  Recent Labs Lab 08/13/15 1131  NA 138  K 4.3  CL 104  CO2 25  GLUCOSE 103*  BUN 15  CREATININE 0.78  CALCIUM 9.5   Liver Function Tests:  Recent Labs Lab 08/13/15 1131  AST 24  ALT 17  ALKPHOS 134*  BILITOT 0.7  PROT 8.2*  ALBUMIN 4.0   BNP (last 3 results)  Recent Labs  01/14/15 2221 01/16/15 0940 02/02/15 1838  BNP 17.6 22.4 35.0    Time spent: 30  minutes  Signed:  Arzell Mcgeehan  [facility] 08/13/2015, 1:24 PM

## 2015-08-13 NOTE — Progress Notes (Signed)
Initial Nutrition Assessment  DOCUMENTATION CODES:   Not applicable  INTERVENTION:   PT declined any supplements at this time.   NUTRITION DIAGNOSIS:   Inadequate oral intake related to nausea, poor appetite as evidenced by per patient/family report.  GOAL:   Patient will meet greater than or equal to 90% of their needs   MONITOR:   PO intake, Labs, I & O's  REASON FOR ASSESSMENT:   Malnutrition Screening Tool    ASSESSMENT:   Patient is a 79 yo female with history of HTN who was brought with cc of fall. Patient said she tripped and denied any dizziness/lighteadedness, imbalance, weakness, sensory or motor deficits She fell on her knees and hit her head. She has been in her right knee, her right arm and her headCT head was negative for fracture/bleeding. R.Xray of knee showed minimally displaced tibia plate fracture.   Spoke with pt at bedside. Reports ok appetite, though she feels nauseous sometimes, likely due to medication and trauma her body underwent with her fall.  Nutrition-Focused physical exam completed. Findings are no fat depletion, no muscle depletion, and no edema.   Pt declined any supplements, said she does not like the taste of them.  Denied weight loss in the previous year, said she lost weight after fracturing her right arm, but that she "needed to lose weight."  No further RD interventions needed. Labs and Medications reviewed.   Diet Order:  Diet Heart Room service appropriate?: Yes; Fluid consistency:: Thin  Skin:  Wound (see comment) (Laceration to top of head.)  Last BM:  08/11/2015  Height:   Ht Readings from Last 1 Encounters:  08/11/15 5\' 6"  (1.676 m)    Weight:   Wt Readings from Last 1 Encounters:  08/11/15 180 lb (81.647 kg)    Ideal Body Weight:  59.09 kg  BMI:  Body mass index is 29.07 kg/(m^2).  Estimated Nutritional Needs:   Kcal:  1600-2000 calories  Protein:  80-95 grams  Fluid:  >/= 1.6L  EDUCATION NEEDS:    No education needs identified at this time  Satira Anis. Mumin Denomme, MS, RD LDN After Hours/Weekend Pager [phone]

## 2015-09-18 ENCOUNTER — Ambulatory Visit: Payer: Self-pay | Admitting: Cardiology

## 2015-09-30 ENCOUNTER — Encounter: Payer: Self-pay | Admitting: Cardiology

## 2015-09-30 ENCOUNTER — Ambulatory Visit (INDEPENDENT_AMBULATORY_CARE_PROVIDER_SITE_OTHER): Payer: Medicare Other | Admitting: Cardiology

## 2015-09-30 VITALS — BP 110/30 | HR 60 | Ht 66.0 in | Wt 176.0 lb

## 2015-09-30 DIAGNOSIS — I10 Essential (primary) hypertension: Secondary | ICD-10-CM

## 2015-09-30 NOTE — Patient Instructions (Signed)
Dr Hochrein recommends that you schedule a follow-up appointment in 1 year. You will receive a reminder letter in the mail two months in advance. If you don't receive a letter, please call our office to schedule the follow-up appointment.  If you need a refill on your cardiac medications before your next appointment, please call your pharmacy. 

## 2015-09-30 NOTE — Progress Notes (Signed)
Thank you     Cardiology Office Note   Date:  09/30/2015   ID:  [name] [name], DOB August 11, 1936, MRN [id number]  PCP:  Henrine Screws, MD  Cardiologist:   [name], MD   Chief Complaint  Patient presents with  . Follow-up    6 month check up, blood pressure has been up and down  . Hypertension      History of Present Illness: [name] [name] is a 80 y.o. female who presents for evaluation of difficult to control hypertension. She had heart block and did have a pacemaker placed.  Since I saw her she broke her leg and she's had a nursing home recovering from this.  She's only had 2 episodes of her blood pressure going up. His remaining Norvasc and Xanax. She thinks she is otherwise done well. She is anxious to return home. The patient denies any new symptoms such as chest discomfort, neck or arm discomfort. There has been no new shortness of breath, PND or orthopnea. There have been no reported palpitations, presyncope or syncope.  Past Medical History  Diagnosis Date  . Hypertension   . Broken arm Right  . Complete heart block ([place])   . Anxiety   . Postural dizziness     Past Surgical History  Procedure Laterality Date  . Ep implantable device N/A 01/16/2015    MDT Adapta L PPM implanted by Dr Rayann Heman for complete heart block     Current Outpatient Prescriptions  Medication Sig Dispense Refill  . acetaminophen (TYLENOL) 500 MG tablet Take 500 mg by mouth every 6 (six) hours as needed for mild pain.    Marland Kitchen ALPRAZolam (XANAX) 0.25 MG tablet Take 0.125 tablets by mouth daily as needed for anxiety.     Marland Kitchen amLODipine (NORVASC) 5 MG tablet Take 5-7.5 mg by mouth daily. May take an additional 2.5mg  as needed for unusually high blood pressure    . aspirin 325 MG tablet Take 650 mg by mouth daily as needed (pain).    . calcium carbonate (TUMS EX) 750 MG chewable tablet Chew 1 tablet by mouth daily.    . Cholecalciferol (VITAMIN D3) 2000 UNITS TABS Take 1 tablet by mouth daily.      . famotidine (PEPCID) 20 MG tablet Take 20 mg by mouth daily.    . fluticasone (FLONASE) 50 MCG/ACT nasal spray Place 2 sprays into both nostrils daily as needed for allergies.     . Multiple Vitamins-Minerals (ICAPS MV PO) Take 1 tablet by mouth daily.     No current facility-administered medications for this visit.    Allergies:   Hctz; Losartan; Macrolides and ketolides; Benazepril; and Penicillins    ROS:  Please see the history of present illness.   Otherwise, review of systems are positive for none.   All other systems are reviewed and negative.    PHYSICAL EXAM: VS:  BP 110/30 mmHg  Pulse 60  Ht 5\' 6"  (1.676 m)  Wt 176 lb (79.833 kg)  BMI 28.42 kg/m2 , BMI Body mass index is 28.42 kg/(m^2). GENERAL:  Well appearing NECK:  No jugular venous distention at 90 degrees,  waveform within normal limits, carotid upstroke brisk and symmetric, no bruits, no thyromegaly LYMPHATICS:  No cervical, inguinal adenopathy LUNGS:  Clear to auscultation bilaterally BACK:  No CVA tenderness CHEST:  Well healed pacer pocket.  HEART:  PMI not displaced or sustained,S1 and S2 within normal limits, no S3, no S4, no clicks, no rubs, no murmurs ABD:  Flat,  positive bowel sounds normal in frequency in pitch, no bruits, no rebound EXT:  2 plus pulses throughout, no edema, no cyanosis no clubbing SKIN:  No rashes no nodules    EKG:  EKG is not ordered today.    Recent Labs: 01/14/2015: TSH 2.274 01/16/2015: Magnesium 2.1 02/02/2015: B Natriuretic Peptide 35.0 08/13/2015: ALT 17; BUN 15; Creatinine, Ser 0.78; Hemoglobin 14.3; Platelets 354; Potassium 4.3; Sodium 138    Lipid Panel    Component Value Date/Time   CHOL 136 01/16/2015 0523   TRIG 84 01/16/2015 0523   HDL 67 01/16/2015 0523   CHOLHDL 2.0 01/16/2015 0523   VLDL 17 01/16/2015 0523   LDLCALC 52 01/16/2015 0523      Wt Readings from Last 3 Encounters:  09/30/15 176 lb (79.833 kg)  08/11/15 180 lb (81.647 kg)  04/07/15 177 lb  9.6 oz (80.559 kg)      Other studies Reviewed: Additional studies/ records that were reviewed today include: hospital records.  Review of the above records demonstrates:     ASSESSMENT AND PLAN:  Complete heart block/Status post pacemaker She is up-to-date with pacemaker placement follow-up. No change in therapy is indicated.  HTN Her blood pressure is well controlled. She will continue the meds as listed.     Current medicines are reviewed at length with the patient today.  The patient does not have concerns regarding medicines.  The following changes have been made:  no change  Labs/ tests ordered today include:  No orders of the defined types were placed in this encounter.     Disposition:   FU with me in 12 months.     Signed, [name], MD  09/30/2015 2:14 PM    [facility]

## 2015-10-06 ENCOUNTER — Encounter: Payer: Self-pay | Admitting: Cardiology

## 2015-10-06 ENCOUNTER — Telehealth: Payer: Self-pay | Admitting: Cardiology

## 2015-10-06 ENCOUNTER — Ambulatory Visit (INDEPENDENT_AMBULATORY_CARE_PROVIDER_SITE_OTHER): Payer: Medicare Other | Admitting: *Deleted

## 2015-10-06 DIAGNOSIS — I441 Atrioventricular block, second degree: Secondary | ICD-10-CM | POA: Diagnosis not present

## 2015-10-06 NOTE — Progress Notes (Signed)
Remote pacemaker transmission.   

## 2015-10-06 NOTE — Telephone Encounter (Signed)
LMOVM reminding pt to send remote transmission.   

## 2015-10-08 ENCOUNTER — Other Ambulatory Visit: Payer: Self-pay | Admitting: Internal Medicine

## 2015-10-10 LAB — CUP PACEART REMOTE DEVICE CHECK
Battery Remaining Longevity: 145 mo
Brady Statistic AP VP Percent: 14 %
Brady Statistic AS VP Percent: 86 %
Brady Statistic AS VS Percent: 0 %
Implantable Lead Implant Date: 20160512
Implantable Lead Location: 753860
Lead Channel Impedance Value: 795 Ohm
Lead Channel Pacing Threshold Amplitude: 0.625 V
Lead Channel Sensing Intrinsic Amplitude: 0.7 mV — CL
Lead Channel Setting Pacing Pulse Width: 0.4 ms
Lead Channel Setting Sensing Sensitivity: 2.8 mV
MDC IDC LEAD IMPLANT DT: 20160512
MDC IDC LEAD LOCATION: 753859
MDC IDC MSMT BATTERY IMPEDANCE: 100 Ohm
MDC IDC MSMT BATTERY VOLTAGE: 2.79 V
MDC IDC MSMT LEADCHNL RA IMPEDANCE VALUE: 520 Ohm
MDC IDC MSMT LEADCHNL RA PACING THRESHOLD AMPLITUDE: 1 V
MDC IDC MSMT LEADCHNL RA PACING THRESHOLD PULSEWIDTH: 0.4 ms
MDC IDC MSMT LEADCHNL RV PACING THRESHOLD PULSEWIDTH: 0.4 ms
MDC IDC SESS DTM: 20170130172120
MDC IDC SET LEADCHNL RA PACING AMPLITUDE: 2 V
MDC IDC SET LEADCHNL RV PACING AMPLITUDE: 2.5 V
MDC IDC STAT BRADY AP VS PERCENT: 0 %

## 2015-10-17 ENCOUNTER — Encounter: Payer: Self-pay | Admitting: Cardiology

## 2015-11-06 ENCOUNTER — Telehealth: Payer: Self-pay | Admitting: Internal Medicine

## 2015-11-06 NOTE — Telephone Encounter (Signed)
Spoke with pt, she is concerned because she has started having episodes of her bp going up again. The 1/2 of xanax will bring it down. She has recently hurt her back and is having a lot of pain. Explained that pain will make the bp elevate. She was referred to her primary doctor for the back pain and to cont to takr the extra 1/2 of the xanax and amlodipine as needed for bp spikes. Questions also answered regarding her pacemaker.

## 2015-11-06 NOTE — Telephone Encounter (Signed)
New message      Pt c/o BP issue: STAT if pt c/o blurred vision, one-sided weakness or slurred speech  1. What are your last 5 BP readings? 165/82, 133/78 after taking xanax 2. Are you having any other symptoms (ex. Dizziness, headache, blurred vision, passed out)? no  3. What is your BP issue? Pt states that when her bp gets high, she feels like she is going to pass out and has sob.  Pt hurt her back last week, broke her right leg in dec---this could have an effect on her bp. She want to talk to a nurse

## 2016-01-05 ENCOUNTER — Ambulatory Visit (INDEPENDENT_AMBULATORY_CARE_PROVIDER_SITE_OTHER): Payer: Medicare Other | Admitting: *Deleted

## 2016-01-05 ENCOUNTER — Telehealth: Payer: Self-pay | Admitting: Cardiology

## 2016-01-05 DIAGNOSIS — I441 Atrioventricular block, second degree: Secondary | ICD-10-CM | POA: Diagnosis not present

## 2016-01-05 NOTE — Telephone Encounter (Signed)
LMOVM reminding pt to send remote transmission.   

## 2016-01-06 ENCOUNTER — Telehealth: Payer: Self-pay | Admitting: Cardiology

## 2016-01-06 NOTE — Progress Notes (Signed)
Remote pacemaker transmission.   

## 2016-01-06 NOTE — Telephone Encounter (Signed)
Called -- no answer. Not sure what to do here. See BP readings - pt needs plan of action for Bp checks and/or dosing.

## 2016-01-06 NOTE — Telephone Encounter (Signed)
New Message  Pt c/o medication issue: 1. Name of Medication: amLODipine (NORVASC) 5 MG tablet  Pt c/o BP issue:  1. What are your last 5 BP readings?  01/01/2016 @ 11a BP 163/90 then it went down after she took a half of the pill. It went to 138/Something? About 3 hours later   01/05/2016 around 11:30a pt states that her Bp shot up and she took 1/4 of  ALPRAZolam (XANAX) 0.25 MG tablet and she felt better  01/05/16 around 3p she came home took the Bp and it was 146/82 so per pt she believed she was fine.  01/06/2016 Around 11:30a her BP was 159/90 and she too 1/2 of the amLODipine (NORVASC) 5 MG tablet  2. Are you having any other symptoms (ex. Dizziness, headache, blurred vision, passed out)? No! but she feels as if she could pass out  3. What is your medication issue? Pt request a call back to determine why her BP goes up around the same time everyday

## 2016-01-06 NOTE — Telephone Encounter (Signed)
Have patient schedule OV with [facility] to review her home readings.

## 2016-01-06 NOTE — Telephone Encounter (Signed)
Spoke to patient, concerns discussed, BP mgmt visit made, pt voiced understanding of appt info and knows to bring list of meds and BP journal with her.

## 2016-01-08 ENCOUNTER — Ambulatory Visit (INDEPENDENT_AMBULATORY_CARE_PROVIDER_SITE_OTHER): Payer: Medicare Other | Admitting: Pharmacist Clinician (PhC)/ Clinical Pharmacy Specialist

## 2016-01-08 VITALS — BP 140/84 | Ht 66.0 in | Wt 179.8 lb

## 2016-01-08 DIAGNOSIS — I10 Essential (primary) hypertension: Secondary | ICD-10-CM

## 2016-01-08 NOTE — Patient Instructions (Signed)
Return for a a follow up appointment in 1 month  Your blood pressure today is 140/84   Check your blood pressure at home daily and keep record of the readings.  Take your BP meds as follows: continue with amlodipine 5 mg daily, take extra 1/2 tablet for BP > 160/90  Bring all of your meds, your BP cuff and your record of home blood pressures to your next appointment.  Exercise as you're able, try to walk approximately 30 minutes per day.  Keep salt intake to a minimum, especially watch canned and prepared boxed foods.  Eat more fresh fruits and vegetables and fewer canned items.  Avoid eating in fast food restaurants.    HOW TO TAKE YOUR BLOOD PRESSURE: . Rest 5 minutes before taking your blood pressure. .  Don't smoke or drink caffeinated beverages for at least 30 minutes before. . Take your blood pressure before (not after) you eat. . Sit comfortably with your back supported and both feet on the floor (don't cross your legs). . Elevate your arm to heart level on a table or a desk. . Use the proper sized cuff. It should fit smoothly and snugly around your bare upper arm. There should be enough room to slip a fingertip under the cuff. The bottom edge of the cuff should be 1 inch above the crease of the elbow. . Ideally, take 3 measurements at one sitting and record the average.

## 2016-01-08 NOTE — Assessment & Plan Note (Signed)
Her blood pressure looks good in the office today.  While we were discussing her history she noted that she was feeling "funny" again and that her pressure must be high.  Checked it then, but was fine at 140/84.  Even 15 minutes later when she stated she was feeling fine, her pressure was 142/84.  I believe some of her increased readings in the past week are due to trying to get a family party planned for 25 guests.  I am not making any changes to her medications today, but have suggested that she not worry about her pressure until Monday, when the party is past.  At that point I would like her to start checking her blood pressure each morning around the same time.  She can check it again later in the day if she feels symptomatic.  I also asked that she not take the extra 2.5 mg amlodipine unless her BP is > 0000000 systolic.  When she comes back in a month she will bring her home cuff and we can determine whether her pressure is well controlled or not.

## 2016-01-08 NOTE — Progress Notes (Signed)
01/08/2016 [name] [name] [birth year] [id number]   HPI:  [name] [name] is a 80 y.o. female patient of Dr Percival Spanish, with a PMH below who presents today for hypertension clinic evaluation.  She reports her blood pressure has been fairly well controlled until the past week, when it started increasing, to as high as 163/90.  She has instructions that she may take an extra 2.5 mg of amlodipine if her pressure is > XX123456 systolic.  She reports doing so, along with 0.125mg  alprazolam, on 2 or more occasions in the past week, with mixed results.  She reports that she will get a flushed "funny" feeling when her BP goes up, but these are the only times she actually checks her readings.  She also describes planning a family reunion for this coming weekend, at her home, for about 25 members of her family.    Cardiac Hx: pacemaker implanted due to heart block  Family Hx: hypertension in both parents and two siblings; mother died at 57, father at 36  Social Hx: no tobacco (quit in 1988); rare wine, 1 cup of coffee/day  Diet: eats out some, but adds very little salt to home cooked meals; prefers fresh or frozen foods, avoids canned  Exercise: walks regularly  Home BP readings: as high as XX123456 systolic, but only checks when she feels symptomatic; no baseline readings  Current antihypertensive medications: amlodipine 5 mg daily, can take extra 2.5 mg for systolic > XX123456  Intolerances: hctz - shortness of breath; losartan - skin crawling sensation; benazepril - "didn't work"   [employer] from Last 3 Encounters:  01/08/16 179 lb 12.8 oz (81.557 kg)  09/30/15 176 lb (79.833 kg)  08/11/15 180 lb (81.647 kg)   BP Readings from Last 3 Encounters:  01/08/16 140/84  09/30/15 110/30  08/13/15 137/60   Pulse Readings from Last 3 Encounters:  09/30/15 60  08/13/15 77  04/07/15 74    Current Outpatient Prescriptions  Medication Sig Dispense Refill  . ALPRAZolam (XANAX) 0.25 MG tablet Take 0.125 tablets  by mouth daily as needed for anxiety.     Marland Kitchen amLODipine (NORVASC) 5 MG tablet Take 1 tablet (5 mg total) by mouth daily. Take an additional 1/2 tablet daily as needed 45 tablet 11  . calcium carbonate (TUMS EX) 750 MG chewable tablet Chew 1 tablet by mouth daily.    . Cholecalciferol (VITAMIN D3) 2000 UNITS TABS Take 1 tablet by mouth daily.    . famotidine (PEPCID) 20 MG tablet Take 20 mg by mouth daily.    . fluticasone (FLONASE) 50 MCG/ACT nasal spray Place 2 sprays into both nostrils daily as needed for allergies.     . Multiple Vitamins-Minerals (ICAPS MV PO) Take 1 tablet by mouth daily.    Marland Kitchen acetaminophen (TYLENOL) 500 MG tablet Take 500 mg by mouth every 6 (six) hours as needed for mild pain.     No current facility-administered medications for this visit.    Allergies  Allergen Reactions  . Hctz [Hydrochlorothiazide] Shortness Of Breath and Other (See Comments)    Very short winded, made her hurt all over   . Losartan Other (See Comments)    Skin crawling feelings  . Macrolides And Ketolides Other (See Comments)    Pyloric sphincter flareups-unsure of which -mycins  . Benazepril Other (See Comments)    Doesn't work for patient  . Penicillins Other (See Comments)    abd pain Has patient had a PCN reaction causing immediate rash, facial/tongue/throat  swelling, SOB or lightheadedness with hypotension: No Has patient had a PCN reaction causing severe rash involving mucus membranes or skin necrosis: No Has patient had a PCN reaction that required hospitalization No Has patient had a PCN reaction occurring within the last 10 years: No If all of the above answers are "NO", then may proceed with Cephalosporin use.      Past Medical History  Diagnosis Date  . Hypertension   . Broken arm Right  . Complete heart block ([place])   . Anxiety   . Postural dizziness   . Broken leg     Blood pressure 140/84, height 5\' 6"  (1.676 m), weight 179 lb 12.8 oz (81.557 kg).    Tommy Medal PharmD CPP [facility]

## 2016-01-22 ENCOUNTER — Telehealth: Payer: Self-pay | Admitting: Internal Medicine

## 2016-01-22 NOTE — Telephone Encounter (Signed)
Patient reports lower back pain "like crazy".  She is looking into ultrasound or electrical stimulation therapy through a PT office or a chiropractor.  Advised patient to make the provider she sees aware that she has a PPM and to call us if they wish to pursue any interventions like these.  Patient is PPM dependent and we will have to have a [employer] rep present during these therapies to determine if they interfere with her PPM function.  Patient verbalizes understanding of all information.  She denies additional questions or concerns at this time.

## 2016-01-22 NOTE — Telephone Encounter (Signed)
New message  Pt called states that she has lower back pain. Pt states that she believes that it is connected to sciatica and she states that she has ultra sound waves to help lessen the pain ans she wonders if it is acceptable with her pacer. Please call back to discuss

## 2016-02-06 ENCOUNTER — Telehealth: Payer: Self-pay | Admitting: Pharmacist Clinician (PhC)/ Clinical Pharmacy Specialist

## 2016-02-06 NOTE — Telephone Encounter (Signed)
Patent called to report that her BP has been WNL since last OV on May 4.  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  She states that her highest reading since then was 147/82 but the majority fall between 120-140.  She is feeling well and would like to cancel her f/u appointment set for next week.   Appointment cancelled, patient aware to call if she has any other cardiac concerns.

## 2016-02-10 ENCOUNTER — Ambulatory Visit: Payer: Self-pay

## 2016-02-13 ENCOUNTER — Encounter: Payer: Self-pay | Admitting: Cardiology

## 2016-02-15 LAB — CUP PACEART REMOTE DEVICE CHECK
Battery Impedance: 100 Ohm
Battery Voltage: 2.79 V
Brady Statistic AP VP Percent: 13 %
Brady Statistic AP VS Percent: 0 %
Brady Statistic AS VS Percent: 0 %
Implantable Lead Implant Date: 20160512
Implantable Lead Implant Date: 20160512
Implantable Lead Location: 753859
Implantable Lead Model: 5076
Implantable Lead Model: 5092
Lead Channel Impedance Value: 478 Ohm
Lead Channel Impedance Value: 680 Ohm
Lead Channel Pacing Threshold Pulse Width: 0.4 ms
Lead Channel Sensing Intrinsic Amplitude: 0.7 mV
Lead Channel Setting Pacing Amplitude: 2.5 V
MDC IDC LEAD LOCATION: 753860
MDC IDC MSMT BATTERY REMAINING LONGEVITY: 143 mo
MDC IDC MSMT LEADCHNL RA PACING THRESHOLD AMPLITUDE: 1 V
MDC IDC MSMT LEADCHNL RV PACING THRESHOLD AMPLITUDE: 0.75 V
MDC IDC MSMT LEADCHNL RV PACING THRESHOLD PULSEWIDTH: 0.4 ms
MDC IDC SESS DTM: 20170501182425
MDC IDC SET LEADCHNL RA PACING AMPLITUDE: 2 V
MDC IDC SET LEADCHNL RV PACING PULSEWIDTH: 0.4 ms
MDC IDC SET LEADCHNL RV SENSING SENSITIVITY: 2.8 mV
MDC IDC STAT BRADY AS VP PERCENT: 87 %

## 2016-04-07 ENCOUNTER — Encounter (INDEPENDENT_AMBULATORY_CARE_PROVIDER_SITE_OTHER): Payer: Self-pay

## 2016-04-07 ENCOUNTER — Ambulatory Visit (INDEPENDENT_AMBULATORY_CARE_PROVIDER_SITE_OTHER): Payer: Medicare Other | Admitting: Internal Medicine

## 2016-04-07 VITALS — BP 152/82 | HR 86 | Ht 66.0 in | Wt 187.0 lb

## 2016-04-07 DIAGNOSIS — I442 Atrioventricular block, complete: Secondary | ICD-10-CM

## 2016-04-07 DIAGNOSIS — I10 Essential (primary) hypertension: Secondary | ICD-10-CM | POA: Diagnosis not present

## 2016-04-07 NOTE — Patient Instructions (Signed)
Medication Instructions:  Your physician recommends that you continue on your current medications as directed. Please refer to the Current Medication list given to you today.   Labwork: None ordered   Testing/Procedures: None ordered   Follow-Up: Your physician wants you to follow-up in: 12 months with Dr Rayann Heman Dennis Bast will receive a reminder letter in the mail two months in advance. If you don't receive a letter, please call our office to schedule the follow-up appointment.   Remote monitoring is used to monitor your Pacemaker  from home. This monitoring reduces the number of office visits required to check your device to one time per year. It allows [place] to keep an eye on the functioning of your device to ensure it is working properly. You are scheduled for a device check from home on 07/07/16. You may send your transmission at any time that day. If you have a wireless device, the transmission will be sent automatically. After your physician reviews your transmission, you will receive a postcard with your next transmission date.    Any Other Special Instructions Will Be Listed Below (If Applicable).     If you need a refill on your cardiac medications before your next appointment, please call your pharmacy.

## 2016-04-07 NOTE — Progress Notes (Signed)
   PCP: Henrine Screws, MD  Primary Cardiologist:  [name] [name] is a 80 y.o. female who presents today for routine electrophysiology followup.  She had a mechanical fall 12/16 and fractured her leg.  She was in rehab for 8 weeks.  She has slowly improved.  Currently seems to be doing well.   Today, she denies symptoms of palpitations,exertional chest pain, shortness of breath, or syncope.  She is worried about BLE edema, though she really does not have any on exam today.  The patient is otherwise without complaint today.   Past Medical History:  Diagnosis Date  . Anxiety   . Broken arm Right  . Broken leg   . Complete heart block ([place])   . Hypertension   . Postural dizziness    Past Surgical History:  Procedure Laterality Date  . EP IMPLANTABLE DEVICE N/A 01/16/2015   MDT Adapta L PPM implanted by Dr Rayann Heman for complete heart block    ROS- all systems are reviewed and negative except as per HPI above  Current Outpatient Prescriptions  Medication Sig Dispense Refill  . acetaminophen (TYLENOL) 500 MG tablet Take 500 mg by mouth every 6 (six) hours as needed for mild pain.    Marland Kitchen ALPRAZolam (XANAX) 0.25 MG tablet Take 0.125 tablets by mouth daily as needed for anxiety.     Marland Kitchen amLODipine (NORVASC) 5 MG tablet Take 1 tablet (5 mg total) by mouth daily. Take an additional 1/2 tablet daily as needed 45 tablet 11  . aspirin 325 MG tablet Take 325 mg by mouth as needed for moderate pain.    Marland Kitchen aspirin 81 MG tablet Take 81 mg by mouth daily.    . calcium carbonate (TUMS EX) 750 MG chewable tablet Chew 1 tablet by mouth daily.    . Cholecalciferol (VITAMIN D3) 2000 UNITS TABS Take 1 tablet by mouth daily.    . famotidine (PEPCID) 20 MG tablet Take 20 mg by mouth daily.    . fluticasone (FLONASE) 50 MCG/ACT nasal spray Place 2 sprays into both nostrils daily as needed for allergies.     . Multiple Vitamins-Minerals (ICAPS MV PO) Take 1 tablet by mouth daily.     No current  facility-administered medications for this visit.     Physical Exam: Vitals:   04/07/16 1153  BP: (!) 152/82  Pulse: 86  SpO2: 96%  Weight: 187 lb (84.8 kg)  Height: 5\' 6"  (1.676 m)    GEN- The patient is anxious appearing, alert and oriented x 3 today.   Head- normocephalic, atraumatic Eyes-  Sclera clear, conjunctiva pink Ears- hearing intact Oropharynx- clear Lungs- Clear to ausculation bilaterally, normal work of breathing Chest- pacemaker pocket is well healed Heart- Regular rate and rhythm, no murmurs, rubs or gallops, PMI not laterally displaced GI- soft, NT, ND, + BS Extremities- no clubbing, cyanosis, or edema  Pacemaker interrogation- reviewed in detail today,  See PACEART report  Assessment and Plan:  1. Complete heart block Normal pacemaker function See Pace Art report No changes today  2. HTN Elevated today.  She reports good bp control at home. Her BP goal is <150/90  [facility] Follow-up with Dr Percival Spanish as scheduled I will see in a year  [name] MD, [facility] 04/07/2016 1:45 PM

## 2016-04-08 LAB — CUP PACEART INCLINIC DEVICE CHECK
Battery Impedance: 108 Ohm
Battery Remaining Longevity: 141 mo
Battery Voltage: 2.79 V
Implantable Lead Implant Date: 20160512
Implantable Lead Implant Date: 20160512
Implantable Lead Model: 5076
Lead Channel Impedance Value: 696 Ohm
Lead Channel Pacing Threshold Pulse Width: 0.4 ms
Lead Channel Pacing Threshold Pulse Width: 0.4 ms
Lead Channel Setting Pacing Amplitude: 2 V
Lead Channel Setting Pacing Pulse Width: 0.4 ms
Lead Channel Setting Sensing Sensitivity: 2.8 mV
MDC IDC LEAD LOCATION: 753859
MDC IDC LEAD LOCATION: 753860
MDC IDC MSMT LEADCHNL RA IMPEDANCE VALUE: 535 Ohm
MDC IDC MSMT LEADCHNL RA PACING THRESHOLD AMPLITUDE: 0.5 V
MDC IDC MSMT LEADCHNL RA SENSING INTR AMPL: 1.4 mV
MDC IDC MSMT LEADCHNL RV PACING THRESHOLD AMPLITUDE: 0.5 V
MDC IDC SESS DTM: 20170802200247
MDC IDC SET LEADCHNL RV PACING AMPLITUDE: 2.5 V
MDC IDC STAT BRADY AP VP PERCENT: 13 %
MDC IDC STAT BRADY AP VS PERCENT: 0 %
MDC IDC STAT BRADY AS VP PERCENT: 87 %
MDC IDC STAT BRADY AS VS PERCENT: 0 %

## 2016-04-09 ENCOUNTER — Encounter: Payer: Self-pay | Admitting: Internal Medicine

## 2016-07-08 ENCOUNTER — Encounter: Payer: Medicare Other | Admitting: *Deleted

## 2016-07-08 ENCOUNTER — Telehealth: Payer: Self-pay | Admitting: Cardiology

## 2016-07-08 NOTE — Telephone Encounter (Signed)
LMOVM reminding pt to send remote transmission.   

## 2016-07-09 ENCOUNTER — Encounter: Payer: Self-pay | Admitting: Cardiology

## 2016-07-23 ENCOUNTER — Ambulatory Visit (INDEPENDENT_AMBULATORY_CARE_PROVIDER_SITE_OTHER): Payer: Medicare Other | Admitting: *Deleted

## 2016-07-23 DIAGNOSIS — I442 Atrioventricular block, complete: Secondary | ICD-10-CM

## 2016-07-23 NOTE — Progress Notes (Signed)
Remote pacemaker transmission.   

## 2016-07-28 ENCOUNTER — Encounter: Payer: Self-pay | Admitting: Cardiology

## 2016-07-31 LAB — CUP PACEART REMOTE DEVICE CHECK
Battery Impedance: 108 Ohm
Battery Remaining Longevity: 140 mo
Battery Voltage: 2.79 V
Brady Statistic AS VP Percent: 89 %
Date Time Interrogation Session: 20171117151852
Implantable Lead Implant Date: 20160512
Implantable Lead Location: 753859
Implantable Lead Model: 5076
Implantable Lead Model: 5092
Implantable Pulse Generator Implant Date: 20160512
Lead Channel Impedance Value: 490 Ohm
Lead Channel Pacing Threshold Amplitude: 0.875 V
Lead Channel Pacing Threshold Pulse Width: 0.4 ms
Lead Channel Setting Pacing Amplitude: 2.5 V
Lead Channel Setting Pacing Pulse Width: 0.4 ms
MDC IDC LEAD IMPLANT DT: 20160512
MDC IDC LEAD LOCATION: 753860
MDC IDC MSMT LEADCHNL RA SENSING INTR AMPL: 0.7 mV
MDC IDC MSMT LEADCHNL RV IMPEDANCE VALUE: 664 Ohm
MDC IDC MSMT LEADCHNL RV PACING THRESHOLD AMPLITUDE: 0.625 V
MDC IDC MSMT LEADCHNL RV PACING THRESHOLD PULSEWIDTH: 0.4 ms
MDC IDC SET LEADCHNL RA PACING AMPLITUDE: 2 V
MDC IDC SET LEADCHNL RV SENSING SENSITIVITY: 2.8 mV
MDC IDC STAT BRADY AP VP PERCENT: 11 %
MDC IDC STAT BRADY AP VS PERCENT: 0 %
MDC IDC STAT BRADY AS VS PERCENT: 0 %

## 2016-08-12 ENCOUNTER — Encounter (INDEPENDENT_AMBULATORY_CARE_PROVIDER_SITE_OTHER): Payer: Medicare Other | Admitting: Ophthalmology

## 2016-08-12 DIAGNOSIS — H353112 Nonexudative age-related macular degeneration, right eye, intermediate dry stage: Secondary | ICD-10-CM

## 2016-08-12 DIAGNOSIS — H353221 Exudative age-related macular degeneration, left eye, with active choroidal neovascularization: Secondary | ICD-10-CM

## 2016-08-12 DIAGNOSIS — H35033 Hypertensive retinopathy, bilateral: Secondary | ICD-10-CM

## 2016-08-12 DIAGNOSIS — H2513 Age-related nuclear cataract, bilateral: Secondary | ICD-10-CM | POA: Diagnosis not present

## 2016-08-12 DIAGNOSIS — H43813 Vitreous degeneration, bilateral: Secondary | ICD-10-CM

## 2016-08-12 DIAGNOSIS — I10 Essential (primary) hypertension: Secondary | ICD-10-CM

## 2016-08-25 ENCOUNTER — Ambulatory Visit (INDEPENDENT_AMBULATORY_CARE_PROVIDER_SITE_OTHER): Payer: Medicare Other | Admitting: Orthopedic Surgery

## 2016-08-25 ENCOUNTER — Ambulatory Visit (INDEPENDENT_AMBULATORY_CARE_PROVIDER_SITE_OTHER): Payer: Medicare Other

## 2016-08-25 ENCOUNTER — Other Ambulatory Visit: Payer: Self-pay | Admitting: Nurse Practitioner

## 2016-08-25 ENCOUNTER — Ambulatory Visit
Admission: RE | Admit: 2016-08-25 | Discharge: 2016-08-25 | Disposition: A | Discharge status: Home / Self Care | Payer: Medicare Other | Source: Ambulatory Visit | Attending: Nurse Practitioner | Admitting: Nurse Practitioner

## 2016-08-25 ENCOUNTER — Encounter (INDEPENDENT_AMBULATORY_CARE_PROVIDER_SITE_OTHER): Payer: Self-pay | Admitting: Orthopedic Surgery

## 2016-08-25 VITALS — BP 163/88 | HR 80 | Resp 16 | Ht 66.0 in | Wt 188.0 lb

## 2016-08-25 DIAGNOSIS — R0602 Shortness of breath: Secondary | ICD-10-CM

## 2016-08-25 DIAGNOSIS — S22000A Wedge compression fracture of unspecified thoracic vertebra, initial encounter for closed fracture: Secondary | ICD-10-CM | POA: Diagnosis not present

## 2016-08-25 DIAGNOSIS — M546 Pain in thoracic spine: Secondary | ICD-10-CM

## 2016-08-25 MED ORDER — HYDROCODONE-ACETAMINOPHEN 5-325 MG PO TABS: 1.0000 | ORAL_TABLET | Freq: Four times a day (QID) | ORAL | 0 refills | Status: DC | PRN

## 2016-08-25 NOTE — Progress Notes (Signed)
Office Visit Note   Patient: [name] [name]           Date of Birth: 07/28/1936           MRN: [id number] Visit Date: 08/25/2016              Requested by: [name], MD [address] [place], [place] [address] PCP: Henrine Screws, MD   Assessment & Plan: Visit Diagnoses:  1. Acute bilateral thoracic back pain   2. Closed compression fracture of thoracic vertebra, initial encounter [facility])     Plan:  #1: I've spoken with Dr. Bonnita Nasuti radiology and has suggested a CT scan to evaluate the T8 vertebral body. She did have a previous CT scan in 2016 which revealed a T7 fracture but not at T8. He states this will hopefully to determine whether this is acute and a candidate for kyphoplasty or vertebroplasty #2: Follow back up in 1 week #3: Has spoken to Dr. Mertha Finders and our plan is for her to be seen by MarTY tAN, NP for evaluation of her hypertension and her breath sounds decreased.  Follow-Up Instructions: Return in about 1 day (around 08/26/2016).   Orders:  Orders Placed This Encounter  Procedures  . XR Ribs Bilateral W/Chest  . XR Thoracic Spine 2 View   Meds ordered this encounter  Medications  . HYDROcodone-acetaminophen (NORCO/VICODIN) 5-325 MG tablet    Sig: Take 1 tablet by mouth every 6 (six) hours as needed for moderate pain.    Dispense:  30 tablet    Refill:  0    Order Specific Question:   Supervising Provider    Answer:   Garald Balding [id number]      Procedures: No procedures performed   Clinical Data: No additional findings.   Subjective: Thoracic spine pain with bilateral Hemithorax pain.   [name] [name] is a very pleasant 80 year old white female who is seen today for evaluation of Severe thoracic back pain x 4 days. She states on Saturday she was sitting in a chair and leaned forward in chair and developed bilateral right and left posterior thorax pain. She stated that it felt like a pull, but hadn't no pain down legs or  upward. She states she has more pain in right than left side, pain radiating in ribs anterior and posterior, more pain with twisting and activity, Tylenol and ASA helps some.  She had cooked for 20 people after this episode. However she states that her pain is present and would like to be seen for evaluation.    Review of Systems  Cardiovascular:       She does have a pacemaker and hypertension  Gastrointestinal:       She does have some acid reflux.  All other systems reviewed and are negative.    Objective: Vital Signs: BP (!) 163/88 (BP Location: Left Arm, Patient Position: Sitting, Cuff Size: Normal)   Pulse 80   Resp 16   Ht 5\' 6"  (1.676 m)   Wt 188 lb (85.3 kg)   BMI 30.34 kg/m   Physical Exam  Constitutional: She is oriented to person, place, and time. She appears well-developed and well-nourished.  HENT:  Head: Normocephalic and atraumatic.  Eyes: EOM are normal. Pupils are equal, round, and reactive to light.  Neck:  No carotid bruits  Cardiovascular: Regular rhythm.   Murmur heard. Markedly distant heart sounds  Pulmonary/Chest: Effort normal.  Some fine crepitus bilaterally. Right breast sounds decreased compared to  the left.  Neurological: She is alert and oriented to person, place, and time.  Skin: Skin is warm and dry.  Psychiatric: She has a normal mood and affect. Her behavior is normal. Judgment and thought content normal.    Back Exam   Tenderness  The patient is experiencing tenderness in the thoracic.  Comments:  Marked kyphosis noted. The patient admitted thoracic spine right and left over at least 3 rib areas. Her right is much more painful than her left. She does have some pain with percussion in the lower ribs. No point area of pain in the rib cages bilaterally. Some pain with percussion of CVA bilaterally no real pain with that of full inspiration.      Specialty Comments:  No specialty comments available.  Imaging: Xr Ribs Bilateral  W/chest  Result Date: 08/25/2016 Chest x-ray reveals increased markings throughout the thorax. Certainly she is osteopenic and I do not see any pneumothorax. Pacemaker is noted in the left upper thorax chest over the vehicle Blakes appears possibly some fluid in the fissure    PMFS History: Patient Active Problem List   Diagnosis Date Noted  . Fall 08/12/2015  . Tibial plateau fracture 08/11/2015  . Cardiac device in situ   . Second degree Mobitz II AV block   . Hypertension    Past Medical History:  Diagnosis Date  . Anxiety   . Broken arm Right  . Broken leg   . Complete heart block ([place])   . Hypertension   . Postural dizziness     Family History  Problem Relation Age of Onset  . Heart failure Mother   . Hypertension Mother   . Hypertension Father   . Atrial fibrillation Father     Past Surgical History:  Procedure Laterality Date  . EP IMPLANTABLE DEVICE N/A 01/16/2015   MDT Adapta L PPM implanted by Dr Rayann Heman for complete heart block  . PACEMAKER INSERTION N/A 01/2015   Social History   Occupational History  . Not on file.   Social History Main Topics  . Smoking status: Former Smoker    Years: 30.00    Types: Cigarettes    Quit date: 09/06/1986  . Smokeless tobacco: Never Used  . Alcohol use 0.0 oz/week     Comment: occasionally 1 monthly  . Drug use: No  . Sexual activity: Not on file

## 2016-09-01 ENCOUNTER — Ambulatory Visit
Admission: RE | Admit: 2016-09-01 | Discharge: 2016-09-01 | Disposition: A | Discharge status: Home / Self Care | Payer: Medicare Other | Source: Ambulatory Visit | Attending: Orthopedic Surgery | Admitting: Orthopedic Surgery

## 2016-09-03 ENCOUNTER — Ambulatory Visit (INDEPENDENT_AMBULATORY_CARE_PROVIDER_SITE_OTHER): Payer: Medicare Other | Admitting: Orthopaedic Surgery

## 2016-09-03 ENCOUNTER — Other Ambulatory Visit (INDEPENDENT_AMBULATORY_CARE_PROVIDER_SITE_OTHER): Payer: Self-pay | Admitting: *Deleted

## 2016-09-03 VITALS — Ht 66.0 in | Wt 188.0 lb

## 2016-09-03 DIAGNOSIS — M4850XD Collapsed vertebra, not elsewhere classified, site unspecified, subsequent encounter for fracture with routine healing: Secondary | ICD-10-CM | POA: Diagnosis not present

## 2016-09-03 DIAGNOSIS — S22068D Other fracture of T7-T8 thoracic vertebra, subsequent encounter for fracture with routine healing: Secondary | ICD-10-CM

## 2016-09-03 NOTE — Addendum Note (Signed)
Addended by: Shona Needles on: 09/03/2016 11:57 AM   Modules accepted: Orders

## 2016-09-03 NOTE — Addendum Note (Signed)
Addended by: Shona Needles on: 09/03/2016 11:44 AM   Modules accepted: Orders

## 2016-09-03 NOTE — Progress Notes (Signed)
   Office Visit Note   Patient: [name] [name]           Date of Birth: [birth year]           MRN: [id number] Visit Date: 09/03/2016              Requested by: [name], MD [address] [place], [place] [address] PCP: Henrine Screws, MD   Assessment & Plan: Visit Diagnoses: Acute compression fracture of T8. Old compression fractures of T7 and T12  Plan: Evaluation by Dr. Estanislado Pandy for consideration of vertebroplasty. Bone density study. follow-up 1 month. Discussed pain management  Follow-Up Instructions: No Follow-up on file.   Orders:  No orders of the defined types were placed in this encounter.  No orders of the defined types were placed in this encounter.     Procedures: No procedures performed   Clinical Data: No additional findings.   Subjective: No chief complaint on file.   Pt having a lot of pain, eases off in the pm.  2-500mg  Tylenol in am and 2-325mg  aspirin for week  Pt states the hydrocodone did not help with the pain, only took a half  Pain mainly upon standing and sitting back down  Prior history of bone loss per density study several years ago without active treatment.  Review of Systems   Objective: Vital Signs: There were no vitals taken for this visit.  Physical Exam  Ortho Exam Mrs. Nakamura is comfortable sitting. She does have percussible tenderness along the mid thoracic spine. No numbness or tingling to either lower extremity.  Specialty Comments:  No specialty comments available.  Imaging: No results found. CT scan demonstrates mild to moderate compression fracture of T8 which was not present on previous CT  scan of the chest on 5 /2016. Moderately severe chronic compression fracture of T7 which was unchanged from prior CT. I held compression fracture of T12 not appear to be acute  PMFS History: Patient Active Problem List   Diagnosis Date Noted  . Fall 08/12/2015  . Tibial plateau fracture 08/11/2015  .  Cardiac device in situ   . Second degree Mobitz II AV block   . Hypertension    Past Medical History:  Diagnosis Date  . Anxiety   . Broken arm Right  . Broken leg   . Complete heart block ([place])   . Hypertension   . Postural dizziness     Family History  Problem Relation Age of Onset  . Heart failure Mother   . Hypertension Mother   . Hypertension Father   . Atrial fibrillation Father     Past Surgical History:  Procedure Laterality Date  . EP IMPLANTABLE DEVICE N/A 01/16/2015   MDT Adapta L PPM implanted by Dr Rayann Heman for complete heart block  . PACEMAKER INSERTION N/A 01/2015   Social History   Occupational History  . Not on file.   Social History Main Topics  . Smoking status: Former Smoker    Years: 30.00    Types: Cigarettes    Quit date: 09/06/1986  . Smokeless tobacco: Never Used  . Alcohol use 0.0 oz/week     Comment: occasionally 1 monthly  . Drug use: No  . Sexual activity: Not on file

## 2016-09-08 ENCOUNTER — Encounter (INDEPENDENT_AMBULATORY_CARE_PROVIDER_SITE_OTHER): Payer: Medicare Other | Admitting: Ophthalmology

## 2016-09-08 DIAGNOSIS — H43813 Vitreous degeneration, bilateral: Secondary | ICD-10-CM | POA: Diagnosis not present

## 2016-09-08 DIAGNOSIS — H353221 Exudative age-related macular degeneration, left eye, with active choroidal neovascularization: Secondary | ICD-10-CM | POA: Diagnosis not present

## 2016-09-08 DIAGNOSIS — I10 Essential (primary) hypertension: Secondary | ICD-10-CM | POA: Diagnosis not present

## 2016-09-08 DIAGNOSIS — H353112 Nonexudative age-related macular degeneration, right eye, intermediate dry stage: Secondary | ICD-10-CM

## 2016-09-08 DIAGNOSIS — H35033 Hypertensive retinopathy, bilateral: Secondary | ICD-10-CM | POA: Diagnosis not present

## 2016-09-09 ENCOUNTER — Other Ambulatory Visit (HOSPITAL_COMMUNITY): Payer: Self-pay | Admitting: Interventional Radiology

## 2016-09-09 DIAGNOSIS — IMO0001 Reserved for inherently not codable concepts without codable children: Secondary | ICD-10-CM

## 2016-09-09 DIAGNOSIS — M4850XA Collapsed vertebra, not elsewhere classified, site unspecified, initial encounter for fracture: Principal | ICD-10-CM

## 2016-09-10 ENCOUNTER — Telehealth: Payer: Self-pay | Admitting: Rheumatology

## 2016-09-10 ENCOUNTER — Other Ambulatory Visit (INDEPENDENT_AMBULATORY_CARE_PROVIDER_SITE_OTHER): Payer: Self-pay | Admitting: *Deleted

## 2016-09-10 ENCOUNTER — Telehealth (INDEPENDENT_AMBULATORY_CARE_PROVIDER_SITE_OTHER): Payer: Self-pay | Admitting: Orthopaedic Surgery

## 2016-09-10 DIAGNOSIS — M8589 Other specified disorders of bone density and structure, multiple sites: Secondary | ICD-10-CM

## 2016-09-10 NOTE — Telephone Encounter (Signed)
Error

## 2016-09-10 NOTE — Telephone Encounter (Signed)
[name] [name] from the [facility] called to let [name] know that patient declined to schedule bone density scan because she says she's unable to lie down for it and she will call them back when she feels up to it.

## 2016-09-15 ENCOUNTER — Encounter (HOSPITAL_COMMUNITY): Payer: Self-pay | Admitting: Radiology

## 2016-09-15 ENCOUNTER — Ambulatory Visit (HOSPITAL_COMMUNITY)
Admission: RE | Admit: 2016-09-15 | Discharge: 2016-09-15 | Disposition: A | Discharge status: Home / Self Care | Payer: Medicare Other | Source: Ambulatory Visit | Attending: Interventional Radiology | Admitting: Interventional Radiology

## 2016-09-15 DIAGNOSIS — IMO0001 Reserved for inherently not codable concepts without codable children: Secondary | ICD-10-CM

## 2016-09-15 DIAGNOSIS — M4850XA Collapsed vertebra, not elsewhere classified, site unspecified, initial encounter for fracture: Principal | ICD-10-CM

## 2016-09-15 HISTORY — PX: IR GENERIC HISTORICAL: IMG1180011

## 2016-09-16 ENCOUNTER — Other Ambulatory Visit (HOSPITAL_COMMUNITY): Payer: Self-pay | Admitting: Interventional Radiology

## 2016-09-16 DIAGNOSIS — S22000A Wedge compression fracture of unspecified thoracic vertebra, initial encounter for closed fracture: Secondary | ICD-10-CM

## 2016-09-20 ENCOUNTER — Telehealth: Payer: Self-pay | Admitting: Cardiology

## 2016-09-20 ENCOUNTER — Encounter (HOSPITAL_COMMUNITY): Payer: Self-pay | Admitting: Interventional Radiology

## 2016-09-20 NOTE — Telephone Encounter (Signed)
Pt stated she's having a procedure schedule for January 23rd, Dr deveshwar is going injected a balloon in her vertebrae and if that doesn't work he's going to injected cement, pt want to know if it's ok to have this procedure with her pacemaker.

## 2016-09-20 NOTE — Telephone Encounter (Signed)
New message   Pt verbalized that she is calling for rn

## 2016-09-24 NOTE — Telephone Encounter (Signed)
Returned patient call and told her that if they needed any clearance forms we would receive a fax. She verbalized understanding.

## 2016-09-27 ENCOUNTER — Other Ambulatory Visit: Payer: Self-pay | Admitting: Radiology

## 2016-09-28 ENCOUNTER — Ambulatory Visit (HOSPITAL_COMMUNITY)
Admission: RE | Admit: 2016-09-28 | Discharge: 2016-09-28 | Disposition: A | Discharge status: Home / Self Care | Payer: Medicare Other | Source: Ambulatory Visit | Attending: Interventional Radiology | Admitting: Interventional Radiology

## 2016-09-28 ENCOUNTER — Encounter (HOSPITAL_COMMUNITY): Payer: Self-pay

## 2016-09-28 DIAGNOSIS — M4854XA Collapsed vertebra, not elsewhere classified, thoracic region, initial encounter for fracture: Secondary | ICD-10-CM | POA: Insufficient documentation

## 2016-09-28 DIAGNOSIS — S22000A Wedge compression fracture of unspecified thoracic vertebra, initial encounter for closed fracture: Secondary | ICD-10-CM

## 2016-09-28 DIAGNOSIS — F419 Anxiety disorder, unspecified: Secondary | ICD-10-CM | POA: Diagnosis not present

## 2016-09-28 DIAGNOSIS — Z8781 Personal history of (healed) traumatic fracture: Secondary | ICD-10-CM | POA: Insufficient documentation

## 2016-09-28 DIAGNOSIS — Z880 Allergy status to penicillin: Secondary | ICD-10-CM | POA: Diagnosis not present

## 2016-09-28 DIAGNOSIS — Z8249 Family history of ischemic heart disease and other diseases of the circulatory system: Secondary | ICD-10-CM | POA: Diagnosis not present

## 2016-09-28 DIAGNOSIS — Z87891 Personal history of nicotine dependence: Secondary | ICD-10-CM | POA: Insufficient documentation

## 2016-09-28 DIAGNOSIS — I442 Atrioventricular block, complete: Secondary | ICD-10-CM | POA: Diagnosis not present

## 2016-09-28 DIAGNOSIS — I10 Essential (primary) hypertension: Secondary | ICD-10-CM | POA: Diagnosis not present

## 2016-09-28 DIAGNOSIS — Z950 Presence of cardiac pacemaker: Secondary | ICD-10-CM | POA: Diagnosis not present

## 2016-09-28 HISTORY — PX: IR GENERIC HISTORICAL: IMG1180011

## 2016-09-28 LAB — CBC
HCT: 42.6 % (ref 36.0–46.0)
Hemoglobin: 13.9 g/dL (ref 12.0–15.0)
MCH: 29 pg (ref 26.0–34.0)
MCHC: 32.6 g/dL (ref 30.0–36.0)
MCV: 88.9 fL (ref 78.0–100.0)
Platelets: 298 10*3/uL (ref 150–400)
RBC: 4.79 MIL/uL (ref 3.87–5.11)
RDW: 15.1 % (ref 11.5–15.5)
WBC: 6.4 10*3/uL (ref 4.0–10.5)

## 2016-09-28 LAB — BASIC METABOLIC PANEL
ANION GAP: 8 (ref 5–15)
BUN: 12 mg/dL (ref 6–20)
CALCIUM: 10 mg/dL (ref 8.9–10.3)
CO2: 25 mmol/L (ref 22–32)
Chloride: 107 mmol/L (ref 101–111)
Creatinine, Ser: 0.95 mg/dL (ref 0.44–1.00)
GFR calc Af Amer: >60 mL/min (ref >60)
GFR, EST NON AFRICAN AMERICAN: 55 mL/min — AB (ref >60)
GLUCOSE: 101 mg/dL — AB (ref 65–99)
Potassium: 4.3 mmol/L (ref 3.5–5.1)
Sodium: 140 mmol/L (ref 135–145)

## 2016-09-28 LAB — APTT: APTT: 35 s (ref 24–36)

## 2016-09-28 LAB — PROTIME-INR
INR: 0.96
PROTHROMBIN TIME: 12.8 s (ref 11.4–15.2)

## 2016-09-28 MED ORDER — MIDAZOLAM HCL 2 MG/2ML IJ SOLN
INTRAMUSCULAR | Status: AC
  Filled 2016-09-28: qty 4

## 2016-09-28 MED ORDER — HYDROMORPHONE HCL 1 MG/ML IJ SOLN
INTRAMUSCULAR | Status: AC
  Filled 2016-09-28: qty 1

## 2016-09-28 MED ORDER — FENTANYL CITRATE (PF) 100 MCG/2ML IJ SOLN
INTRAMUSCULAR | Status: AC
  Filled 2016-09-28: qty 4

## 2016-09-28 MED ORDER — VANCOMYCIN HCL IN DEXTROSE 1-5 GM/200ML-% IV SOLN
INTRAVENOUS | Status: AC
  Filled 2016-09-28: qty 200

## 2016-09-28 MED ORDER — TOBRAMYCIN SULFATE 1.2 G IJ SOLR
INTRAMUSCULAR | Status: AC
  Administered 2016-09-28: 1.2 g
  Filled 2016-09-28: qty 1.2

## 2016-09-28 MED ORDER — VANCOMYCIN HCL IN DEXTROSE 1-5 GM/200ML-% IV SOLN
1000.0000 mg | Freq: Once | INTRAVENOUS | Status: AC
  Administered 2016-09-28: 1000 mg via INTRAVENOUS

## 2016-09-28 MED ORDER — SODIUM CHLORIDE 0.9 % IV SOLN: INTRAVENOUS | Status: DC

## 2016-09-28 MED ORDER — SODIUM CHLORIDE 0.9 % IV SOLN: INTRAVENOUS | Status: AC

## 2016-09-28 MED ORDER — FENTANYL CITRATE (PF) 100 MCG/2ML IJ SOLN
INTRAMUSCULAR | Status: AC | PRN
  Administered 2016-09-28 (×3): 25 ug via INTRAVENOUS

## 2016-09-28 MED ORDER — IOPAMIDOL (ISOVUE-300) INJECTION 61%
INTRAVENOUS | Status: AC
  Administered 2016-09-28: 10 mL
  Filled 2016-09-28: qty 50

## 2016-09-28 MED ORDER — MIDAZOLAM HCL 2 MG/2ML IJ SOLN
INTRAMUSCULAR | Status: AC | PRN
  Administered 2016-09-28 (×2): 1 mg via INTRAVENOUS

## 2016-09-28 MED ORDER — BUPIVACAINE HCL (PF) 0.25 % IJ SOLN
INTRAMUSCULAR | Status: AC
  Administered 2016-09-28: 30 mL
  Filled 2016-09-28: qty 30

## 2016-09-28 MED ORDER — GELATIN ABSORBABLE 12-7 MM EX MISC
CUTANEOUS | Status: AC
  Filled 2016-09-28: qty 1

## 2016-09-28 NOTE — H&P (Signed)
Chief Complaint: T8 compression fracture  Referring Physician:Dr. Joni Fears  Supervising Physician: Luanne Bras  Patient Status: [facility] - Out-pt  HPI: [name] [name] is an 81 y.o. female who bent over a chair trying to pick up something in mid-December.  She instantly felt pain in her mid to upper back.  She went and saw her orthopedist.  She had xrays completed that revealed some old compression fractures, but possibly something new.  She was then scheduled for a thoracic CT scan as she has a pacemaker and is unable to get an MRI.  This revealed a chronic T7 compression fracture and an acute T8 compression fracture.  She was then referred to Dr. Estanislado Pandy for evaluation for a KP.  She has noticed significant improvement in her symptoms in the last 2 weeks.  She apparently is having some referred pain around her sides as well.  She presents today for her KP procedure.  Past Medical History:  Past Medical History:  Diagnosis Date  . Anxiety   . Broken arm Right  . Broken leg   . Complete heart block ([place])   . Hypertension   . Postural dizziness     Past Surgical History:  Past Surgical History:  Procedure Laterality Date  . EP IMPLANTABLE DEVICE N/A 01/16/2015   MDT Adapta L PPM implanted by Dr Rayann Heman for complete heart block  . IR GENERIC HISTORICAL  09/15/2016   IR RADIOLOGIST EVAL & MGMT 09/15/2016 [facility]-INTERV RAD  . PACEMAKER INSERTION N/A 01/2015    Family History:  Family History  Problem Relation Age of Onset  . Heart failure Mother   . Hypertension Mother   . Hypertension Father   . Atrial fibrillation Father     Social History:  reports that she quit smoking about 30 years ago. Her smoking use included Cigarettes. She quit after 30.00 years of use. She has never used smokeless tobacco. She reports that she drinks alcohol. She reports that she does not use drugs.  Allergies:  Allergies  Allergen Reactions  . Hctz [Hydrochlorothiazide] Shortness Of Breath  and Other (See Comments)    Very short winded, made her hurt all over   . Losartan Other (See Comments)    Skin crawling feelings  . Macrolides And Ketolides Other (See Comments)    Pyloric sphincter flareups-unsure of which -mycins  . Benazepril Other (See Comments)    Doesn't work for patient  . Penicillins Other (See Comments)    abd pain Has patient had a PCN reaction causing immediate rash, facial/tongue/throat swelling, SOB or lightheadedness with hypotension: No Has patient had a PCN reaction causing severe rash involving mucus membranes or skin necrosis: No Has patient had a PCN reaction that required hospitalization No Has patient had a PCN reaction occurring within the last 10 years: No If all of the above answers are "NO", then may proceed with Cephalosporin use.      Medications: Medications reviewed in epic  Please HPI for pertinent positives, otherwise complete 10 system ROS negative.  Mallampati Score: MD Evaluation Airway: WNL Heart: Other (comments) Heart  comments: has LUC pace maker Abdomen: WNL Chest/ Lungs: WNL ASA  Classification: 2 Mallampati/Airway Score: Two  Physical Exam: BP (!) 156/90   Pulse 75   Temp 97.8 F (36.6 C)   Resp 18   Ht 5\' 6"  (1.676 m)   Wt 184 lb (83.5 kg)   SpO2 98%   BMI 29.70 kg/m  Body mass index is 29.7 kg/m. General:  pleasant, obese white female who is laying in bed in NAD HEENT: head is normocephalic, atraumatic.  Sclera are noninjected.  PERRL.  Ears and nose without any masses or lesions.  Mouth is pink and moist Heart: regular, rate, and rhythm.  Normal s1,s2. No obvious murmurs, gallops, or rubs noted.  Palpable radial pulses bilaterally.  Pacemaker noted Lungs: CTAB, no wheezes, rhonchi, or rales noted.  Respiratory effort nonlabored Abd: soft, NT, ND, +BS, no masses or organomegaly.  Reducible umbilical hernia. Psych: A&Ox3 with an appropriate affect.   Labs: No results found for this or any previous visit  (from the past 48 hour(s)).  Imaging: No results found.  Assessment/Plan 1. T8 compression fracture -we will plan to proceed today with KP if able.  If not, then we will perform a VP. -labs and vitals have been reviewed -Risks and Benefits discussed with the patient including, but not limited to education regarding the natural healing process of compression fractures without intervention, bleeding, infection, cement migration which may cause spinal cord damage, paralysis, pulmonary embolism or even death. All of the patient's questions were answered, patient is agreeable to proceed. Consent signed and in chart.   Thank you for this interesting consult.  I greatly enjoyed meeting Nazia Haze and look forward to participating in their care.  A copy of this report was sent to the requesting provider on this date.  Electronically Signed: Henreitta Cea 09/28/2016, 9:35 AM   I spent a total of    25 Minutes in face to face in clinical consultation, greater than 50% of which was counseling/coordinating care for T8 compression fracture

## 2016-09-28 NOTE — Sedation Documentation (Signed)
Called charge Teressa Lower in short stay. Unable to take report at this time.

## 2016-09-28 NOTE — Sedation Documentation (Signed)
Attempted to call report; RN unavailable will call back

## 2016-09-28 NOTE — Discharge Instructions (Signed)
°  1.No stooping,bending or lifting more than 10 lbs for 2 weeks. 2.Use wal;ker to ambulate for 2 weeks. 3.RTC in 2 weeks    KYPHOPLASTY/VERTEBROPLASTY DISCHARGE INSTRUCTIONS  Medications: (check all that apply)     Resume all home medications as before procedure.       Resume your (aspirin/Plavix/Coumadin) as directed.                  Continue your pain medications as prescribed as needed.  Over the next 3-5 days, decrease your pain medication as tolerated.  Over the counter medications (i.e. Tylenol, ibuprofen, and aleve) may be substituted once severe/moderate pain symptoms have subsided.   Wound Care: - Bandages may be removed the day following your procedure.  You may get your incision wet once bandages are removed.  Bandaids may be used to cover the incisions until scab formation.  Topical ointments are optional.  - If you develop a fever greater than 101 degrees, have increased skin redness at the incision sites or pus-like oozing from incisions occurring within 1 week of the procedure, contact radiology at [phone] or [phone].  - Ice pack to back for 15-20 minutes 2-3 time per day for first 2-3 days post procedure.  The ice will expedite muscle healing and help with the pain from the incisions.   Activity: - Bedrest today with limited activity for 24 hours post procedure.  - No driving for 48 hours.  - Increase your activity as tolerated after bedrest (with assistance if necessary).  - Refrain from any strenuous activity or heavy lifting (greater than 10 lbs.).   Follow up: - Contact radiology at [phone] or [phone] if any questions/concerns.  - A physician assistant from radiology will contact you in approximately 1 week.  - If a biopsy was performed at the time of your procedure, your referring physician should receive the results in usually 2-3 days.        1.No stooping,bending or lifting more than 10 lbs for 2 weeks. 2.Use wal;ker to ambulate for 2  weeks. 3.RTC in 2 weeks

## 2016-09-28 NOTE — Sedation Documentation (Signed)
Attempted to call report. Nurse unavailable to take report. Will call back.

## 2016-09-28 NOTE — Procedures (Signed)
S/P T 8 balloon KP 

## 2016-09-29 ENCOUNTER — Encounter (HOSPITAL_COMMUNITY): Payer: Self-pay | Admitting: Interventional Radiology

## 2016-10-04 ENCOUNTER — Ambulatory Visit (INDEPENDENT_AMBULATORY_CARE_PROVIDER_SITE_OTHER): Payer: Medicare Other | Admitting: Orthopaedic Surgery

## 2016-10-06 ENCOUNTER — Encounter (INDEPENDENT_AMBULATORY_CARE_PROVIDER_SITE_OTHER): Payer: Medicare Other | Admitting: Ophthalmology

## 2016-10-06 DIAGNOSIS — H35033 Hypertensive retinopathy, bilateral: Secondary | ICD-10-CM | POA: Diagnosis not present

## 2016-10-06 DIAGNOSIS — H43813 Vitreous degeneration, bilateral: Secondary | ICD-10-CM | POA: Diagnosis not present

## 2016-10-06 DIAGNOSIS — I10 Essential (primary) hypertension: Secondary | ICD-10-CM | POA: Diagnosis not present

## 2016-10-06 DIAGNOSIS — H2513 Age-related nuclear cataract, bilateral: Secondary | ICD-10-CM | POA: Diagnosis not present

## 2016-10-06 DIAGNOSIS — H353231 Exudative age-related macular degeneration, bilateral, with active choroidal neovascularization: Secondary | ICD-10-CM

## 2016-10-07 ENCOUNTER — Other Ambulatory Visit (HOSPITAL_COMMUNITY): Payer: Self-pay | Admitting: Interventional Radiology

## 2016-10-07 DIAGNOSIS — M4850XA Collapsed vertebra, not elsewhere classified, site unspecified, initial encounter for fracture: Principal | ICD-10-CM

## 2016-10-07 DIAGNOSIS — IMO0001 Reserved for inherently not codable concepts without codable children: Secondary | ICD-10-CM

## 2016-10-08 NOTE — Progress Notes (Signed)
Cardiology Office Note   Date:  10/13/2016   ID:  [name] [name], DOB 1935-12-05, MRN [id number]  PCP:  Henrine Screws, MD  Cardiologist:   [name], MD   Chief Complaint  Patient presents with  . Hypertension    History of Present Illness: [name] [name] is a 81 y.o. female who presents for evaluation of difficult to control hypertension. She had heart block and did have a pacemaker placed.  Since I saw her she has had treatment of a compression fracture of her thoracic verterbra .  From a cardiac standpoint she has had no cardiac problems.  The patient denies any new symptoms such as chest discomfort, neck or arm discomfort. There has been no new shortness of breath, PND or orthopnea. There have been no reported palpitations, presyncope or syncope.   Past Medical History:  Diagnosis Date  . Anxiety   . Broken arm Right  . Broken leg   . Complete heart block ([place])   . Hypertension   . Postural dizziness     Past Surgical History:  Procedure Laterality Date  . EP IMPLANTABLE DEVICE N/A 01/16/2015   MDT Adapta L PPM implanted by Dr Rayann Heman for complete heart block  . IR GENERIC HISTORICAL  09/15/2016   IR RADIOLOGIST EVAL & MGMT 09/15/2016 [facility]  . IR GENERIC HISTORICAL  09/28/2016   IR KYPHO THORACIC WITH BONE BIOPSY 09/28/2016 Luanne Bras, MD [facility]  . PACEMAKER INSERTION N/A 01/2015     Current Outpatient Prescriptions  Medication Sig Dispense Refill  . acetaminophen (TYLENOL) 500 MG tablet Take 500 mg by mouth every 6 (six) hours as needed for mild pain.    Marland Kitchen ALPRAZolam (XANAX) 0.25 MG tablet Take 0.125 tablets by mouth daily as needed for anxiety.     Marland Kitchen amLODipine (NORVASC) 5 MG tablet Take 1 tablet (5 mg total) by mouth daily. Take an additional 1/2 tablet daily as needed 45 tablet 11  . aspirin 325 MG tablet Take 650 mg by mouth at bedtime as needed for moderate pain. Will  take at bedtime if needed for pain    . aspirin 81 MG tablet Take 81 mg by mouth daily.    Marland Kitchen Besifloxacin HCl (BESIVANCE) 0.6 % SUSP Apply 1 drop to eye 4 (four) times daily. For 2 days after injection    . calcium carbonate (TUMS EX) 750 MG chewable tablet Chew 1 tablet by mouth 2 (two) times daily as needed.     . Cholecalciferol (VITAMIN D3) 2000 units TABS Take 1 tablet by mouth daily.    . famotidine (PEPCID) 20 MG tablet Take 20 mg by mouth daily.    . fluticasone (FLONASE) 50 MCG/ACT nasal spray Place 2 sprays into both nostrils daily as needed for allergies.     Marland Kitchen lidocaine (LIDODERM) 5 % Place 1 patch onto the skin daily as needed. Remove & Discard patch within 12 hours or as directed by MD    . Magnesium Hydroxide (PHILLIPS MILK OF MAGNESIA PO) Take 1-2 tablets by mouth 2 (two) times daily as needed.    . Multiple Vitamins-Minerals (ICAPS MV PO) Take 1 tablet by mouth daily.    Marland Kitchen PRESCRIPTION MEDICATION Place into both eyes every 30 (thirty) days. Avastin injection in left eye every 4 weeks by Dr Hilma Favors Next injection due 10-06-2016      No current facility-administered medications for this visit.     Allergies:   Hctz [hydrochlorothiazide]; Losartan; Macrolides and ketolides; Benazepril; and  Penicillins    ROS:  Please see the history of present illness.   Otherwise, review of systems are positive for back pain.   All other systems are reviewed and negative.    PHYSICAL EXAM: VS:  BP (!) 167/85   Pulse 73   Ht 5\' 5"  (1.651 m)   Wt 185 lb 12.8 oz (84.3 kg)   BMI 30.92 kg/m  , BMI Body mass index is 30.92 kg/m. NECK:  No jugular venous distention, waveform within normal limits, carotid upstroke brisk and symmetric, no bruits, no thyromegaly LUNGS:  Clear to auscultation bilaterally CHEST:  Unremarkable, well healed pacer pocket HEART:  PMI not displaced or sustained,S1 and S2 within normal limits, no S3, no S4, no clicks, no rubs, no murmurs ABD:  Flat, positive bowel  sounds normal in frequency in pitch, no bruits, no rebound, no guarding, no midline pulsatile mass, no hepatomegaly, no splenomegaly EXT:  2 plus pulses throughout, no edema, no cyanosis no clubbing SKIN:  No rashes no nodules  EKG:  EKG is  not ordered today. Sinus rhythm with first-degree AV block, left ventricular pacing 100% capture.    Recent Labs: 09/28/2016: BUN 12; Creatinine, Ser 0.95; Hemoglobin 13.9; Platelets 298; Potassium 4.3; Sodium 140    Lipid Panel    Component Value Date/Time   CHOL 136 01/16/2015 0523   TRIG 84 01/16/2015 0523   HDL 67 01/16/2015 0523   CHOLHDL 2.0 01/16/2015 0523   VLDL 17 01/16/2015 0523   LDLCALC 52 01/16/2015 0523      Wt Readings from Last 3 Encounters:  10/12/16 185 lb 12.8 oz (84.3 kg)  09/28/16 184 lb (83.5 kg)  09/03/16 188 lb (85.3 kg)      Other studies Reviewed: Additional studies/ records that were reviewed today include: None Review of the above records demonstrates:      ASSESSMENT AND PLAN:  Complete heart block/Status post pacemaker She is up-to-date with pacemaker placement follow-up. No change in therapy is indicated.  HTN Her blood pressure is elevated today but this is unusual. Other readings are well controlled.. She will continue the meds as listed.     Current medicines are reviewed at length with the patient today.  The patient does not have concerns regarding medicines.  The following changes have been made:  None  Labs/ tests ordered today include:  None  Orders Placed This Encounter  Procedures  . EKG 12-Lead     Disposition:   FU with me in 12 months.     Signed, [name], MD  10/13/2016 6:27 PM    [place]

## 2016-10-12 ENCOUNTER — Encounter: Payer: Self-pay | Admitting: Cardiology

## 2016-10-12 ENCOUNTER — Ambulatory Visit (INDEPENDENT_AMBULATORY_CARE_PROVIDER_SITE_OTHER): Payer: Medicare Other | Admitting: Cardiology

## 2016-10-12 VITALS — BP 167/85 | HR 73 | Ht 65.0 in | Wt 185.8 lb

## 2016-10-12 DIAGNOSIS — I442 Atrioventricular block, complete: Secondary | ICD-10-CM | POA: Diagnosis not present

## 2016-10-12 DIAGNOSIS — I10 Essential (primary) hypertension: Secondary | ICD-10-CM | POA: Diagnosis not present

## 2016-10-12 NOTE — Patient Instructions (Signed)

## 2016-10-13 ENCOUNTER — Encounter: Payer: Self-pay | Admitting: Cardiology

## 2016-10-14 ENCOUNTER — Encounter (HOSPITAL_COMMUNITY): Payer: Self-pay | Admitting: Radiology

## 2016-10-14 ENCOUNTER — Ambulatory Visit (HOSPITAL_COMMUNITY)
Admission: RE | Admit: 2016-10-14 | Discharge: 2016-10-14 | Disposition: A | Discharge status: Home / Self Care | Payer: Medicare Other | Source: Ambulatory Visit | Attending: Interventional Radiology | Admitting: Interventional Radiology

## 2016-10-14 DIAGNOSIS — IMO0001 Reserved for inherently not codable concepts without codable children: Secondary | ICD-10-CM

## 2016-10-14 DIAGNOSIS — M4850XA Collapsed vertebra, not elsewhere classified, site unspecified, initial encounter for fracture: Principal | ICD-10-CM

## 2016-10-14 HISTORY — PX: IR GENERIC HISTORICAL: IMG1180011

## 2016-10-21 ENCOUNTER — Telehealth (INDEPENDENT_AMBULATORY_CARE_PROVIDER_SITE_OTHER): Payer: Self-pay | Admitting: Orthopaedic Surgery

## 2016-10-21 NOTE — Telephone Encounter (Signed)
Patient wants Dr. Durward Fortes to know that Dr. Estanislado Pandy performed a procedure on her back on 09/28/16 and she feels fine. Just an fyi for you, per patient.

## 2016-10-25 ENCOUNTER — Ambulatory Visit (INDEPENDENT_AMBULATORY_CARE_PROVIDER_SITE_OTHER): Payer: Medicare Other | Admitting: *Deleted

## 2016-10-25 DIAGNOSIS — I442 Atrioventricular block, complete: Secondary | ICD-10-CM

## 2016-10-25 NOTE — Progress Notes (Signed)
Remote pacemaker transmission.   

## 2016-10-26 ENCOUNTER — Encounter (HOSPITAL_COMMUNITY): Payer: Self-pay | Admitting: Interventional Radiology

## 2016-10-26 ENCOUNTER — Encounter: Payer: Self-pay | Admitting: Cardiology

## 2016-10-26 LAB — CUP PACEART REMOTE DEVICE CHECK
Battery Impedance: 108 Ohm
Battery Remaining Longevity: 142 mo
Brady Statistic AS VS Percent: 0 %
Date Time Interrogation Session: 20180219141152
Implantable Lead Implant Date: 20160512
Implantable Lead Implant Date: 20160512
Implantable Lead Location: 753859
Implantable Lead Location: 753860
Implantable Lead Model: 5076
Implantable Pulse Generator Implant Date: 20160512
Lead Channel Impedance Value: 770 Ohm
Lead Channel Pacing Threshold Amplitude: 0.75 V
Lead Channel Setting Pacing Amplitude: 2.5 V
Lead Channel Setting Pacing Pulse Width: 0.4 ms
Lead Channel Setting Sensing Sensitivity: 2.8 mV
MDC IDC MSMT BATTERY VOLTAGE: 2.79 V
MDC IDC MSMT LEADCHNL RA IMPEDANCE VALUE: 520 Ohm
MDC IDC MSMT LEADCHNL RA PACING THRESHOLD PULSEWIDTH: 0.4 ms
MDC IDC MSMT LEADCHNL RV PACING THRESHOLD AMPLITUDE: 0.625 V
MDC IDC MSMT LEADCHNL RV PACING THRESHOLD PULSEWIDTH: 0.4 ms
MDC IDC SET LEADCHNL RA PACING AMPLITUDE: 2 V
MDC IDC STAT BRADY AP VP PERCENT: 10 %
MDC IDC STAT BRADY AP VS PERCENT: 0 %
MDC IDC STAT BRADY AS VP PERCENT: 90 %

## 2016-11-03 ENCOUNTER — Encounter (INDEPENDENT_AMBULATORY_CARE_PROVIDER_SITE_OTHER): Payer: Medicare Other | Admitting: Ophthalmology

## 2016-11-03 DIAGNOSIS — H353231 Exudative age-related macular degeneration, bilateral, with active choroidal neovascularization: Secondary | ICD-10-CM | POA: Diagnosis not present

## 2016-11-03 DIAGNOSIS — H43813 Vitreous degeneration, bilateral: Secondary | ICD-10-CM

## 2016-11-03 DIAGNOSIS — I10 Essential (primary) hypertension: Secondary | ICD-10-CM

## 2016-11-03 DIAGNOSIS — H35033 Hypertensive retinopathy, bilateral: Secondary | ICD-10-CM | POA: Diagnosis not present

## 2016-11-12 ENCOUNTER — Other Ambulatory Visit: Payer: Self-pay | Admitting: Cardiology

## 2016-12-01 ENCOUNTER — Encounter (INDEPENDENT_AMBULATORY_CARE_PROVIDER_SITE_OTHER): Payer: Medicare Other | Admitting: Ophthalmology

## 2016-12-01 DIAGNOSIS — H353231 Exudative age-related macular degeneration, bilateral, with active choroidal neovascularization: Secondary | ICD-10-CM

## 2016-12-01 DIAGNOSIS — H2513 Age-related nuclear cataract, bilateral: Secondary | ICD-10-CM

## 2016-12-01 DIAGNOSIS — H43813 Vitreous degeneration, bilateral: Secondary | ICD-10-CM

## 2016-12-01 DIAGNOSIS — H35033 Hypertensive retinopathy, bilateral: Secondary | ICD-10-CM | POA: Diagnosis not present

## 2016-12-01 DIAGNOSIS — I10 Essential (primary) hypertension: Secondary | ICD-10-CM

## 2016-12-29 ENCOUNTER — Other Ambulatory Visit: Payer: Self-pay | Admitting: Cardiology

## 2016-12-29 ENCOUNTER — Encounter (INDEPENDENT_AMBULATORY_CARE_PROVIDER_SITE_OTHER): Payer: Medicare Other | Admitting: Ophthalmology

## 2016-12-29 DIAGNOSIS — H43813 Vitreous degeneration, bilateral: Secondary | ICD-10-CM

## 2016-12-29 DIAGNOSIS — H35033 Hypertensive retinopathy, bilateral: Secondary | ICD-10-CM

## 2016-12-29 DIAGNOSIS — H2513 Age-related nuclear cataract, bilateral: Secondary | ICD-10-CM | POA: Diagnosis not present

## 2016-12-29 DIAGNOSIS — H353231 Exudative age-related macular degeneration, bilateral, with active choroidal neovascularization: Secondary | ICD-10-CM

## 2016-12-29 DIAGNOSIS — I10 Essential (primary) hypertension: Secondary | ICD-10-CM

## 2017-01-24 ENCOUNTER — Ambulatory Visit (INDEPENDENT_AMBULATORY_CARE_PROVIDER_SITE_OTHER): Payer: Medicare Other | Admitting: *Deleted

## 2017-01-24 ENCOUNTER — Encounter (INDEPENDENT_AMBULATORY_CARE_PROVIDER_SITE_OTHER): Payer: Medicare Other | Admitting: Ophthalmology

## 2017-01-24 DIAGNOSIS — H353231 Exudative age-related macular degeneration, bilateral, with active choroidal neovascularization: Secondary | ICD-10-CM

## 2017-01-24 DIAGNOSIS — H43813 Vitreous degeneration, bilateral: Secondary | ICD-10-CM | POA: Diagnosis not present

## 2017-01-24 DIAGNOSIS — H35033 Hypertensive retinopathy, bilateral: Secondary | ICD-10-CM

## 2017-01-24 DIAGNOSIS — I442 Atrioventricular block, complete: Secondary | ICD-10-CM | POA: Diagnosis not present

## 2017-01-24 DIAGNOSIS — I10 Essential (primary) hypertension: Secondary | ICD-10-CM | POA: Diagnosis not present

## 2017-01-24 NOTE — Progress Notes (Signed)
Remote pacemaker transmission.   

## 2017-01-25 LAB — CUP PACEART REMOTE DEVICE CHECK
Battery Impedance: 131 Ohm
Battery Remaining Longevity: 135 mo
Brady Statistic AP VP Percent: 11 %
Brady Statistic AS VP Percent: 89 %
Brady Statistic AS VS Percent: 0 %
Date Time Interrogation Session: 20180521121930
Implantable Lead Implant Date: 20160512
Implantable Lead Implant Date: 20160512
Implantable Lead Location: 753859
Implantable Pulse Generator Implant Date: 20160512
Lead Channel Impedance Value: 483 Ohm
Lead Channel Impedance Value: 731 Ohm
Lead Channel Setting Pacing Amplitude: 2 V
Lead Channel Setting Pacing Amplitude: 2.5 V
Lead Channel Setting Sensing Sensitivity: 2.8 mV
MDC IDC LEAD LOCATION: 753860
MDC IDC MSMT BATTERY VOLTAGE: 2.79 V
MDC IDC MSMT LEADCHNL RA PACING THRESHOLD AMPLITUDE: 0.875 V
MDC IDC MSMT LEADCHNL RA PACING THRESHOLD PULSEWIDTH: 0.4 ms
MDC IDC MSMT LEADCHNL RV PACING THRESHOLD AMPLITUDE: 0.625 V
MDC IDC MSMT LEADCHNL RV PACING THRESHOLD PULSEWIDTH: 0.4 ms
MDC IDC SET LEADCHNL RV PACING PULSEWIDTH: 0.4 ms
MDC IDC STAT BRADY AP VS PERCENT: 0 %

## 2017-01-26 ENCOUNTER — Encounter: Payer: Self-pay | Admitting: Cardiology

## 2017-02-23 ENCOUNTER — Encounter (INDEPENDENT_AMBULATORY_CARE_PROVIDER_SITE_OTHER): Payer: Medicare Other | Admitting: Ophthalmology

## 2017-02-23 DIAGNOSIS — I10 Essential (primary) hypertension: Secondary | ICD-10-CM

## 2017-02-23 DIAGNOSIS — H353231 Exudative age-related macular degeneration, bilateral, with active choroidal neovascularization: Secondary | ICD-10-CM | POA: Diagnosis not present

## 2017-02-23 DIAGNOSIS — H35033 Hypertensive retinopathy, bilateral: Secondary | ICD-10-CM | POA: Diagnosis not present

## 2017-02-23 DIAGNOSIS — H43813 Vitreous degeneration, bilateral: Secondary | ICD-10-CM

## 2017-03-23 ENCOUNTER — Encounter (INDEPENDENT_AMBULATORY_CARE_PROVIDER_SITE_OTHER): Payer: Medicare Other | Admitting: Ophthalmology

## 2017-03-23 DIAGNOSIS — I10 Essential (primary) hypertension: Secondary | ICD-10-CM

## 2017-03-23 DIAGNOSIS — H2513 Age-related nuclear cataract, bilateral: Secondary | ICD-10-CM | POA: Diagnosis not present

## 2017-03-23 DIAGNOSIS — H353231 Exudative age-related macular degeneration, bilateral, with active choroidal neovascularization: Secondary | ICD-10-CM

## 2017-03-23 DIAGNOSIS — H43813 Vitreous degeneration, bilateral: Secondary | ICD-10-CM

## 2017-03-23 DIAGNOSIS — H35033 Hypertensive retinopathy, bilateral: Secondary | ICD-10-CM | POA: Diagnosis not present

## 2017-04-11 ENCOUNTER — Encounter: Payer: Self-pay | Admitting: Internal Medicine

## 2017-04-11 ENCOUNTER — Ambulatory Visit (INDEPENDENT_AMBULATORY_CARE_PROVIDER_SITE_OTHER): Payer: Medicare Other | Admitting: Internal Medicine

## 2017-04-11 VITALS — BP 146/80 | HR 81 | Ht 64.0 in | Wt 190.6 lb

## 2017-04-11 DIAGNOSIS — I442 Atrioventricular block, complete: Secondary | ICD-10-CM | POA: Diagnosis not present

## 2017-04-11 DIAGNOSIS — I441 Atrioventricular block, second degree: Secondary | ICD-10-CM

## 2017-04-11 NOTE — Patient Instructions (Addendum)
Medication Instructions:  Your physician recommends that you continue on your current medications as directed. Please refer to the Current Medication list given to you today.   Labwork: None ordered   Testing/Procedures: None ordered   Follow-Up: Remote monitoring is used to monitor your Pacemaker  from home. This monitoring reduces the number of office visits required to check your device to one time per year. It allows [place] to keep an eye on the functioning of your device to ensure it is working properly. You are scheduled for a device check from home on 05/18/17. You may send your transmission at any time that day. If you have a wireless device, the transmission will be sent automatically. After your physician reviews your transmission, you will receive a postcard with your next transmission date.    Your physician wants you to follow-up in: 12 months with Chanetta Marshall, NP You will receive a reminder letter in the mail two months in advance. If you don't receive a letter, please call our office to schedule the follow-up appointment.   Any Other Special Instructions Will Be Listed Below (If Applicable).     If you need a refill on your cardiac medications before your next appointment, please call your pharmacy.

## 2017-04-11 NOTE — Progress Notes (Signed)
PCP: [name], MD Primary Cardiologist:  Dr Percival Spanish Primary EP:  Dr Rayann Heman  [name] [name] is a 81 y.o. female who presents today for routine electrophysiology followup.  Since last being seen in our clinic, the patient reports doing very well.  Today, she denies symptoms of palpitations, exertional chest pain, shortness of breath,  lower extremity edema, dizziness, presyncope, or syncope.  She has very rare nonexertional jaw pain.  None recently.  Declines stress testing today but will discuss with Dr Percival Spanish when she sees him again. The patient is otherwise without complaint today.   Past Medical History:  Diagnosis Date  . Anxiety   . Broken arm Right  . Broken leg   . Complete heart block ([place])   . Hypertension   . Postural dizziness    Past Surgical History:  Procedure Laterality Date  . EP IMPLANTABLE DEVICE N/A 01/16/2015   MDT Adapta L PPM implanted by Dr Rayann Heman for complete heart block  . IR GENERIC HISTORICAL  09/15/2016   IR RADIOLOGIST EVAL & MGMT 09/15/2016 [facility]  . IR GENERIC HISTORICAL  09/28/2016   IR KYPHO THORACIC WITH BONE BIOPSY 09/28/2016 Luanne Bras, MD [facility]  . IR GENERIC HISTORICAL  10/14/2016   IR RADIOLOGIST EVAL & MGMT 10/14/2016 [facility]  . PACEMAKER INSERTION N/A 01/2015    ROS- all systems are reviewed and negative except as per HPI above  Current Outpatient Prescriptions  Medication Sig Dispense Refill  . acetaminophen (TYLENOL) 500 MG tablet Take 500 mg by mouth every 6 (six) hours as needed for mild pain.    Marland Kitchen ALPRAZolam (XANAX) 0.25 MG tablet Take 0.125 tablets by mouth daily as needed for anxiety.     Marland Kitchen amLODipine (NORVASC) 5 MG tablet TAKE 1 TABLET ONCE DAILY AND MAY TAKE ADDITIONAL HALF TABLET DAILY IFNEEDED. 45 tablet 11  . aspirin 325 MG tablet Take 650 mg by mouth at bedtime as needed for moderate pain. Will take at bedtime if needed for pain    . aspirin 81 MG tablet Take 81 mg by mouth daily.    Marland Kitchen  Besifloxacin HCl (BESIVANCE) 0.6 % SUSP Apply 1 drop to eye 4 (four) times daily. For 2 days after injection    . calcium carbonate (TUMS EX) 750 MG chewable tablet Chew 1 tablet by mouth 2 (two) times daily as needed.     . Cholecalciferol (VITAMIN D3) 2000 units TABS Take 1 tablet by mouth daily.    . famotidine (PEPCID) 20 MG tablet Take 20 mg by mouth daily.    . fluticasone (FLONASE) 50 MCG/ACT nasal spray Place 2 sprays into both nostrils daily as needed for allergies.     Marland Kitchen lidocaine (LIDODERM) 5 % Place 1 patch onto the skin daily as needed. Remove & Discard patch within 12 hours or as directed by MD    . Magnesium Hydroxide (PHILLIPS MILK OF MAGNESIA PO) Take 1-2 tablets by mouth 2 (two) times daily as needed.    . Multiple Vitamins-Minerals (ICAPS MV PO) Take 1 tablet by mouth daily.    Marland Kitchen PRESCRIPTION MEDICATION Place into both eyes every 30 (thirty) days. Avastin injection in left eye every 4 weeks by Dr Hilma Favors Next injection due 10-06-2016      No current facility-administered medications for this visit.     Physical Exam: Vitals:   04/11/17 1556  BP: (!) 146/80  Pulse: 81  SpO2: 96%  Weight: 190 lb 9.6 oz (86.5 kg)  Height: 5'  4" (1.626 m)    GEN- The patient is well appearing, alert and oriented x 3 today.   Head- normocephalic, atraumatic Eyes-  Sclera clear, conjunctiva pink Ears- hearing intact Oropharynx- clear Lungs- Clear to ausculation bilaterally, normal work of breathing Chest- pacemaker pocket is well healed Heart- Regular rate and rhythm, no murmurs, rubs or gallops, PMI not laterally displaced GI- soft, NT, ND, + BS Extremities- no clubbing, cyanosis, or edema  Pacemaker interrogation- reviewed in detail today,  See PACEART report   Assessment and Plan:  1.  complete heart block Normal pacemaker function See Pace Art report No changes today  2. HTN Stable No change required today  3. Nonsustained atrial arrhythmias Last < 1 minute Will  follow conservatively  carelink Follow-up with Dr Percival Spanish as scheduled Return to see EP NP every year and I will see when needed  [name] MD, [facility] 04/11/2017 4:11 PM

## 2017-04-12 LAB — CUP PACEART INCLINIC DEVICE CHECK
Battery Impedance: 131 Ohm
Battery Voltage: 2.79 V
Brady Statistic AP VP Percent: 11 %
Implantable Lead Implant Date: 20160512
Implantable Lead Implant Date: 20160512
Implantable Lead Location: 753860
Implantable Pulse Generator Implant Date: 20160512
Lead Channel Impedance Value: 476 Ohm
Lead Channel Impedance Value: 694 Ohm
Lead Channel Pacing Threshold Amplitude: 0.5 V
Lead Channel Pacing Threshold Amplitude: 0.625 V
Lead Channel Pacing Threshold Pulse Width: 0.4 ms
Lead Channel Pacing Threshold Pulse Width: 0.4 ms
Lead Channel Pacing Threshold Pulse Width: 0.4 ms
Lead Channel Sensing Intrinsic Amplitude: 1 mV
Lead Channel Setting Pacing Amplitude: 2 V
Lead Channel Setting Sensing Sensitivity: 2.8 mV
MDC IDC LEAD LOCATION: 753859
MDC IDC MSMT BATTERY REMAINING LONGEVITY: 134 mo
MDC IDC MSMT LEADCHNL RV PACING THRESHOLD AMPLITUDE: 0.75 V
MDC IDC MSMT LEADCHNL RV PACING THRESHOLD AMPLITUDE: 1 V
MDC IDC MSMT LEADCHNL RV PACING THRESHOLD PULSEWIDTH: 0.4 ms
MDC IDC SESS DTM: 20180806181622
MDC IDC SET LEADCHNL RV PACING AMPLITUDE: 2.5 V
MDC IDC SET LEADCHNL RV PACING PULSEWIDTH: 0.4 ms
MDC IDC STAT BRADY AP VS PERCENT: 0 %
MDC IDC STAT BRADY AS VP PERCENT: 89 %
MDC IDC STAT BRADY AS VS PERCENT: 0 %

## 2017-04-20 ENCOUNTER — Encounter (INDEPENDENT_AMBULATORY_CARE_PROVIDER_SITE_OTHER): Payer: Medicare Other | Admitting: Ophthalmology

## 2017-04-20 DIAGNOSIS — H353231 Exudative age-related macular degeneration, bilateral, with active choroidal neovascularization: Secondary | ICD-10-CM | POA: Diagnosis not present

## 2017-04-20 DIAGNOSIS — H35033 Hypertensive retinopathy, bilateral: Secondary | ICD-10-CM

## 2017-04-20 DIAGNOSIS — I10 Essential (primary) hypertension: Secondary | ICD-10-CM | POA: Diagnosis not present

## 2017-04-20 DIAGNOSIS — H43813 Vitreous degeneration, bilateral: Secondary | ICD-10-CM | POA: Diagnosis not present

## 2017-05-18 ENCOUNTER — Telehealth: Payer: Self-pay | Admitting: Cardiology

## 2017-05-18 ENCOUNTER — Encounter (INDEPENDENT_AMBULATORY_CARE_PROVIDER_SITE_OTHER): Payer: Medicare Other | Admitting: Ophthalmology

## 2017-05-18 ENCOUNTER — Ambulatory Visit (INDEPENDENT_AMBULATORY_CARE_PROVIDER_SITE_OTHER): Payer: Medicare Other | Admitting: *Deleted

## 2017-05-18 DIAGNOSIS — I442 Atrioventricular block, complete: Secondary | ICD-10-CM

## 2017-05-18 DIAGNOSIS — I10 Essential (primary) hypertension: Secondary | ICD-10-CM | POA: Diagnosis not present

## 2017-05-18 DIAGNOSIS — H35033 Hypertensive retinopathy, bilateral: Secondary | ICD-10-CM

## 2017-05-18 DIAGNOSIS — H353231 Exudative age-related macular degeneration, bilateral, with active choroidal neovascularization: Secondary | ICD-10-CM | POA: Diagnosis not present

## 2017-05-18 DIAGNOSIS — H43813 Vitreous degeneration, bilateral: Secondary | ICD-10-CM | POA: Diagnosis not present

## 2017-05-18 NOTE — Telephone Encounter (Signed)
Spoke with pt and reminded pt of remote transmission that is due today. Pt verbalized understanding.   

## 2017-05-18 NOTE — Progress Notes (Signed)
Remote pacemaker transmission.   

## 2017-05-19 LAB — CUP PACEART REMOTE DEVICE CHECK
Brady Statistic AP VP Percent: 13 %
Brady Statistic AP VS Percent: 0 %
Brady Statistic AS VP Percent: 87 %
Brady Statistic AS VS Percent: 0 %
Date Time Interrogation Session: 20180912141757
Implantable Lead Implant Date: 20160512
Implantable Lead Location: 753859
Implantable Lead Location: 753860
Implantable Lead Model: 5092
Lead Channel Impedance Value: 490 Ohm
Lead Channel Impedance Value: 709 Ohm
Lead Channel Pacing Threshold Amplitude: 0.625 V
Lead Channel Pacing Threshold Amplitude: 0.75 V
Lead Channel Pacing Threshold Pulse Width: 0.4 ms
Lead Channel Pacing Threshold Pulse Width: 0.4 ms
Lead Channel Setting Pacing Amplitude: 2 V
Lead Channel Setting Pacing Amplitude: 2.5 V
MDC IDC LEAD IMPLANT DT: 20160512
MDC IDC MSMT BATTERY IMPEDANCE: 155 Ohm
MDC IDC MSMT BATTERY REMAINING LONGEVITY: 128 mo
MDC IDC MSMT BATTERY VOLTAGE: 2.78 V
MDC IDC PG IMPLANT DT: 20160512
MDC IDC SET LEADCHNL RV PACING PULSEWIDTH: 0.4 ms
MDC IDC SET LEADCHNL RV SENSING SENSITIVITY: 2.8 mV

## 2017-05-20 ENCOUNTER — Encounter: Payer: Self-pay | Admitting: Cardiology

## 2017-06-16 ENCOUNTER — Encounter (INDEPENDENT_AMBULATORY_CARE_PROVIDER_SITE_OTHER): Payer: Medicare Other | Admitting: Ophthalmology

## 2017-06-16 DIAGNOSIS — I10 Essential (primary) hypertension: Secondary | ICD-10-CM | POA: Diagnosis not present

## 2017-06-16 DIAGNOSIS — H35033 Hypertensive retinopathy, bilateral: Secondary | ICD-10-CM

## 2017-06-16 DIAGNOSIS — H353231 Exudative age-related macular degeneration, bilateral, with active choroidal neovascularization: Secondary | ICD-10-CM | POA: Diagnosis not present

## 2017-06-16 DIAGNOSIS — H43813 Vitreous degeneration, bilateral: Secondary | ICD-10-CM

## 2017-07-11 ENCOUNTER — Encounter (INDEPENDENT_AMBULATORY_CARE_PROVIDER_SITE_OTHER): Payer: Medicare Other | Admitting: Ophthalmology

## 2017-07-11 DIAGNOSIS — H353231 Exudative age-related macular degeneration, bilateral, with active choroidal neovascularization: Secondary | ICD-10-CM

## 2017-07-11 DIAGNOSIS — I10 Essential (primary) hypertension: Secondary | ICD-10-CM

## 2017-07-11 DIAGNOSIS — H2513 Age-related nuclear cataract, bilateral: Secondary | ICD-10-CM | POA: Diagnosis not present

## 2017-07-11 DIAGNOSIS — H43813 Vitreous degeneration, bilateral: Secondary | ICD-10-CM

## 2017-07-11 DIAGNOSIS — H35033 Hypertensive retinopathy, bilateral: Secondary | ICD-10-CM | POA: Diagnosis not present

## 2017-08-05 ENCOUNTER — Telehealth: Payer: Self-pay | Admitting: Cardiology

## 2017-08-05 NOTE — Telephone Encounter (Signed)
Pt aware to call back if further concerns.

## 2017-08-05 NOTE — Telephone Encounter (Signed)
I agree with Nathan's assessment.  If patient feels that she needs to be seen about this she can be put on PharmD schedule.

## 2017-08-05 NOTE — Telephone Encounter (Signed)
Pt of Dr. Percival Spanish Hx HTN, mobitz type II.  She has PCP but prefers HTN management by our office.  Discussed w patient. She has a history of BP readings running high in doctor's office and then running in normal range at home.  States in the last 2 days she has been rushing around doing a lot of cleaning in home, doing a Lawyer, etc - notes her BPs at home have been running high when she checks.  She notes she doesn't check her BP daily but knows she should, typically checks her BP when she has symptoms r/t stress, anxiety, headache, etc.  I recommended to make sure she takes a few minutes to rest before she takes her readings, pt voiced she hasn't been doing this but understands to try this, and see if BPs look better when checked. I also recommended her to have a regular time during the day to check BP so that she can have a better log of her baseline pressures.   Shared systolic readings from last 2 days in 150s. She notes she has PRN xanax which she uses occasionally for anxiety and this seems to help with BP too. She also has PRN amlodipine if her systolic is above 314. She usually only treats if it gets to 160 or above.  Pt notes when she gets normal resting readings they are usually 970-263 systolic.  Has a history of BP bottoming out or running low in 90s. I did recommend continuance of current regimen with PRN med use - suggested she use the xanax if she feels the primary concern is anxiety vs BP running high, as the anxiety can drive an increase in BP just as exertion can.  Recommended she call if she notes average resting pressures running higher than 785Y systolic. Pt verbalized understanding. She was very grateful for advice, and aware to call if she has further questions.

## 2017-08-05 NOTE — Telephone Encounter (Signed)
Pt c/o BP issue: STAT if pt c/o blurred vision, one-sided weakness or slurred speech  1. What are your last 5 BP readings? N/A  2. Are you having any other symptoms (ex. Dizziness, headache, blurred vision, passed out)? "little bit of a headache" and "queasy"  3. What is your BP issue? Higher than normal and fluctuates

## 2017-08-08 ENCOUNTER — Telehealth: Payer: Self-pay | Admitting: Internal Medicine

## 2017-08-08 NOTE — Telephone Encounter (Signed)
Mrs. [name] [name] is calling because she has been feeling light headed and her bp goes up and she does not feel right . Has been drinking a mess of water. Wants to send in a Remote Check . Also would like for me to send this message to both Dr. Hochrein(Primary Cardiologist)  and Dr. Rayann Heman .   Also she has Avastin injections in her eye every four weeks . Please call

## 2017-08-08 NOTE — Telephone Encounter (Signed)
Transmission received. Normal PPM function, no episodes. Presenting rhythm is sinus with RV pacing. Remote PPM transmission does not give me information regarding BP or thoracic impedance for HF diagnostics.

## 2017-08-08 NOTE — Telephone Encounter (Signed)
Normal device function No further EP workup planned at this time. Would follow-up with PCP or Dr Percival Spanish if symptoms persist.  [name] MD, [facility] 08/08/2017 5:40 PM

## 2017-08-10 ENCOUNTER — Encounter (INDEPENDENT_AMBULATORY_CARE_PROVIDER_SITE_OTHER): Payer: Medicare Other | Admitting: Ophthalmology

## 2017-08-10 DIAGNOSIS — H2513 Age-related nuclear cataract, bilateral: Secondary | ICD-10-CM | POA: Diagnosis not present

## 2017-08-10 DIAGNOSIS — H35033 Hypertensive retinopathy, bilateral: Secondary | ICD-10-CM

## 2017-08-10 DIAGNOSIS — H353231 Exudative age-related macular degeneration, bilateral, with active choroidal neovascularization: Secondary | ICD-10-CM | POA: Diagnosis not present

## 2017-08-10 DIAGNOSIS — I10 Essential (primary) hypertension: Secondary | ICD-10-CM

## 2017-08-10 DIAGNOSIS — H43813 Vitreous degeneration, bilateral: Secondary | ICD-10-CM | POA: Diagnosis not present

## 2017-08-12 ENCOUNTER — Telehealth: Payer: Self-pay | Admitting: Internal Medicine

## 2017-08-12 NOTE — Telephone Encounter (Signed)
Pt c/o BP issue: STAT if pt c/o blurred vision, one-sided weakness or slurred speech  1. What are your last 5 BP readings? 166/86 158/78  153/87 159/89  2. Are you having any other symptoms (ex. Dizziness, headache, blurred vision, passed out)? Headaches   3. What is your BP issue? bp high

## 2017-08-12 NOTE — Telephone Encounter (Signed)
°  1. Has your device fired? Unknown   2. Is you device beeping? no  3. Are you experiencing draining or swelling at device site? no  4. Are you calling to see if we received your device transmission? yes  5. Have you passed out? no    Please route to [place]

## 2017-08-12 NOTE — Telephone Encounter (Signed)
Informed patient that remote transmission was received, and everything was stable, but she sent too early. Patient verbalized understanding and plans to send on 12/12 when she is scheduled.

## 2017-08-12 NOTE — Telephone Encounter (Signed)
Patient calling and states that her BP has been elevated 150-160s/70-80s. She states that she sometimes has a HA with it. Patient takes amlodipine 5 mg daily and will take an extra 1/2 tablet in the afternoons if elevated. Patient states that she has been running around the house and doing a lot of cleaning. She also states that she checks her BP at 8 am before meds and 2pm. Instructed the patient to make sure that she take a few minutes to rest prior to taking her BP and to wait 1-2 hours after meds to take her BP. Also advised the patient that if she felt like her BP was elevated due to stress/anxiety she may also use her Xanax. Also instructed the patient to avoid salt in her diet. Patient verbalized understanding. Patient placed on [facility] schedule for 12/11 at 10:30 AM.   Patient also asking if her device transmission showed anything that she sent on 12/3. Made patient aware that Dr. Rayann Heman reviewed it and stated "Normal device function No further EP workup planned at this time. Would follow-up with PCP or Dr Percival Spanish if symptoms persist.". Patient was releived to here this.   Patient also asking when she is scheduled for her next remote transmission. Made patient aware that she is scheduled for remote download on 12/12. Patient verbalized understanding and thanked me for the call.

## 2017-08-16 ENCOUNTER — Ambulatory Visit: Payer: Self-pay

## 2017-08-17 ENCOUNTER — Ambulatory Visit (INDEPENDENT_AMBULATORY_CARE_PROVIDER_SITE_OTHER): Payer: Medicare Other | Admitting: *Deleted

## 2017-08-17 DIAGNOSIS — I442 Atrioventricular block, complete: Secondary | ICD-10-CM

## 2017-08-17 NOTE — Progress Notes (Signed)
Remote pacemaker transmission.   

## 2017-08-19 LAB — CUP PACEART REMOTE DEVICE CHECK
Battery Impedance: 155 Ohm
Battery Remaining Longevity: 128 mo
Battery Voltage: 2.78 V
Brady Statistic AP VP Percent: 17 %
Brady Statistic AS VP Percent: 83 %
Brady Statistic AS VS Percent: 0 %
Implantable Lead Implant Date: 20160512
Implantable Lead Model: 5092
Implantable Pulse Generator Implant Date: 20160512
Lead Channel Impedance Value: 497 Ohm
Lead Channel Pacing Threshold Amplitude: 0.875 V
Lead Channel Setting Pacing Amplitude: 2 V
Lead Channel Setting Pacing Amplitude: 2.5 V
Lead Channel Setting Pacing Pulse Width: 0.4 ms
MDC IDC LEAD IMPLANT DT: 20160512
MDC IDC LEAD LOCATION: 753859
MDC IDC LEAD LOCATION: 753860
MDC IDC MSMT LEADCHNL RA PACING THRESHOLD PULSEWIDTH: 0.4 ms
MDC IDC MSMT LEADCHNL RV IMPEDANCE VALUE: 711 Ohm
MDC IDC MSMT LEADCHNL RV PACING THRESHOLD AMPLITUDE: 0.75 V
MDC IDC MSMT LEADCHNL RV PACING THRESHOLD PULSEWIDTH: 0.4 ms
MDC IDC SESS DTM: 20181212160724
MDC IDC SET LEADCHNL RV SENSING SENSITIVITY: 2.8 mV
MDC IDC STAT BRADY AP VS PERCENT: 0 %

## 2017-08-23 ENCOUNTER — Encounter: Payer: Self-pay | Admitting: Cardiology

## 2017-08-23 ENCOUNTER — Ambulatory Visit (INDEPENDENT_AMBULATORY_CARE_PROVIDER_SITE_OTHER): Payer: Medicare Other | Admitting: Pharmacist

## 2017-08-23 VITALS — BP 136/78 | HR 78

## 2017-08-23 DIAGNOSIS — I10 Essential (primary) hypertension: Secondary | ICD-10-CM | POA: Diagnosis not present

## 2017-08-23 NOTE — Patient Instructions (Addendum)
Return for a  follow up appointment as needed  Your blood pressure today is 136/78 pulse 78  Check your blood pressure at home daily (if able) and keep record of the readings.  Take your BP meds as follows: *Continue all medication as prescribed*  Bring all of your meds, your BP cuff and your record of home blood pressures to your next appointment.  Exercise as you're able, try to walk approximately 30 minutes per day.  Keep salt intake to a minimum, especially watch canned and prepared boxed foods.  Eat more fresh fruits and vegetables and fewer canned items.  Avoid eating in fast food restaurants.    HOW TO TAKE YOUR BLOOD PRESSURE: . Rest 5 minutes before taking your blood pressure. .  Don't smoke or drink caffeinated beverages for at least 30 minutes before. . Take your blood pressure before (not after) you eat. . Sit comfortably with your back supported and both feet on the floor (don't cross your legs). . Elevate your arm to heart level on a table or a desk. . Use the proper sized cuff. It should fit smoothly and snugly around your bare upper arm. There should be enough room to slip a fingertip under the cuff. The bottom edge of the cuff should be 1 inch above the crease of the elbow. . Ideally, take 3 measurements at one sitting and record the average.

## 2017-08-23 NOTE — Progress Notes (Signed)
Patient ID: [name] [name]                 DOB: 05/10/1936                      MRN: [id number]     HPI: [name] [name] is a 81 y.o. female referred by Dr. Rayann Heman to HTN clinic. PMH includes heart block s/p pacemaker placement, anxiety, and uncontrolled hypertension.  Currently on amlodipine monotherapy for hypertension management. Patient reports recent headaches associated with elevated BP readings. She presents to hypertension clinic today and denies dizziness, head aches, increased fatigue, shortness of breath, swelling or chest pain.  Noted detailed BP records from home showe elevated BP of ~160s on Nov/29 and Nov/30, then back down to normal range. Patient reports she was NOT felling well at the end of November when her brother, her niece and herself experienced dizziness and stomach discomfort after [place] the beach.   Current HTN meds:  Amlodipine 5mg  daily plus 2.5mg  PRN   Previously tried:  HCTZ - Shortness of breath Losartan - skin crawling feeling Benazepril - lack therapeutic response  BP goal: 130/80  Family History: hypertension in both parents and two siblings; mother died at 43, father at 67  Social History: no tobacco (quit in 1988); rare wine, 1 cup of coffee/day  Diet: eats out some, but adds very little salt to home cooked meals; prefers fresh or frozen foods, avoids canned.  Deviate some during recent vacation but now back to normal diet  Exercise: walks regularly   Home BP readings: 20 readings; average 125/75 (pulse 62-78 bpm)  Wt Readings from Last 3 Encounters:  04/11/17 190 lb 9.6 oz (86.5 kg)  10/12/16 185 lb 12.8 oz (84.3 kg)  09/28/16 184 lb (83.5 kg)   BP Readings from Last 3 Encounters:  08/23/17 136/78  04/11/17 (!) 146/80  10/12/16 (!) 167/85   Pulse Readings from Last 3 Encounters:  08/23/17 78  04/11/17 81  10/12/16 73    Renal function: CrCl cannot be calculated (Patient's most recent lab result is older than the maximum 21 days  allowed.).  Past Medical History:  Diagnosis Date  . Anxiety   . Broken arm Right  . Broken leg   . Complete heart block ([place])   . Hypertension   . Postural dizziness     Current Outpatient Medications on File Prior to Visit  Medication Sig Dispense Refill  . acetaminophen (TYLENOL) 500 MG tablet Take 500 mg by mouth every 6 (six) hours as needed for mild pain.    Marland Kitchen ALPRAZolam (XANAX) 0.25 MG tablet Take 0.125 tablets by mouth daily as needed for anxiety.     Marland Kitchen amLODipine (NORVASC) 5 MG tablet TAKE 1 TABLET ONCE DAILY AND MAY TAKE ADDITIONAL HALF TABLET DAILY IFNEEDED. 45 tablet 11  . aspirin 325 MG tablet Take 650 mg by mouth at bedtime as needed for moderate pain. Will take at bedtime if needed for pain    . aspirin 81 MG tablet Take 81 mg by mouth daily.    Marland Kitchen Besifloxacin HCl (BESIVANCE) 0.6 % SUSP Apply 1 drop to eye 4 (four) times daily. For 2 days after injection    . calcium carbonate (TUMS EX) 750 MG chewable tablet Chew 1 tablet by mouth 2 (two) times daily as needed.     . Cholecalciferol (VITAMIN D3) 2000 units TABS Take 1 tablet by mouth daily.    . famotidine (PEPCID) 20 MG tablet  Take 20 mg by mouth daily.    . fluticasone (FLONASE) 50 MCG/ACT nasal spray Place 2 sprays into both nostrils daily as needed for allergies.     Marland Kitchen lidocaine (LIDODERM) 5 % Place 1 patch onto the skin daily as needed. Remove & Discard patch within 12 hours or as directed by MD    . Magnesium Hydroxide (PHILLIPS MILK OF MAGNESIA PO) Take 1-2 tablets by mouth 2 (two) times daily as needed.    . Multiple Vitamins-Minerals (ICAPS MV PO) Take 1 tablet by mouth daily.    Marland Kitchen PRESCRIPTION MEDICATION Place into both eyes every 30 (thirty) days. Avastin injection in left eye every 4 weeks by Dr Hilma Favors Next injection due 10-06-2016      No current facility-administered medications on file prior to visit.     Allergies  Allergen Reactions  . Hctz [Hydrochlorothiazide] Shortness Of Breath and Other (See  Comments)    Very short winded, made her hurt all over   . Losartan Other (See Comments)    Skin crawling feelings  . Macrolides And Ketolides Other (See Comments)    Pyloric sphincter flareups-unsure of which -mycins  . Benazepril Other (See Comments)    Doesn't work for patient  . Penicillins Other (See Comments)    abd pain Has patient had a PCN reaction causing immediate rash, facial/tongue/throat swelling, SOB or lightheadedness with hypotension: No Has patient had a PCN reaction causing severe rash involving mucus membranes or skin necrosis: No Has patient had a PCN reaction that required hospitalization No Has patient had a PCN reaction occurring within the last 10 years: No If all of the above answers are "NO", then may proceed with Cephalosporin use.      Blood pressure 136/78, pulse 78, SpO2 96 %.  Hypertension Blood pressure well controlled today in office. Excellent control also noted in the last 10 days with an average home BP reading of 125/75. Patient experienced 2 days of unusually elevated BP during at the end of November during a period of acute sickness and significant diet changes. She also reports some degree of "white coat syndrome" noted by cardiologist in the past. Will continue current medication regimen without changes, continue low sodium diet, and follow up as needed. Patient was encouraged to monitor BP 1-2x/week and call clinic if problems of significant changes noted.  Lashawne Dura Rodriguez-Guzman PharmD, BCPS, [place] [address] [place] [address] 08/24/2017 4:45 PM

## 2017-08-24 ENCOUNTER — Encounter: Payer: Self-pay | Admitting: Pharmacist

## 2017-08-24 NOTE — Assessment & Plan Note (Addendum)
Blood pressure well controlled today in office. Excellent control also noted in the last 10 days with an average home BP reading of 125/75. Patient experienced 2 days of unusually elevated BP during at the end of November during a period of acute sickness and significant diet changes. She also reports some degree of "white coat syndrome" noted by cardiologist in the past. Will continue current medication regimen without changes, continue low sodium diet, and follow up as needed. Patient was encouraged to monitor BP 1-2x/week and call clinic if problems of significant changes noted.

## 2017-09-09 ENCOUNTER — Encounter (INDEPENDENT_AMBULATORY_CARE_PROVIDER_SITE_OTHER): Payer: Medicare Other | Admitting: Ophthalmology

## 2017-09-09 DIAGNOSIS — H43813 Vitreous degeneration, bilateral: Secondary | ICD-10-CM | POA: Diagnosis not present

## 2017-09-09 DIAGNOSIS — H35033 Hypertensive retinopathy, bilateral: Secondary | ICD-10-CM | POA: Diagnosis not present

## 2017-09-09 DIAGNOSIS — H353231 Exudative age-related macular degeneration, bilateral, with active choroidal neovascularization: Secondary | ICD-10-CM

## 2017-09-09 DIAGNOSIS — I10 Essential (primary) hypertension: Secondary | ICD-10-CM

## 2017-10-05 ENCOUNTER — Encounter (INDEPENDENT_AMBULATORY_CARE_PROVIDER_SITE_OTHER): Payer: Medicare Other | Admitting: Ophthalmology

## 2017-10-05 DIAGNOSIS — H43813 Vitreous degeneration, bilateral: Secondary | ICD-10-CM | POA: Diagnosis not present

## 2017-10-05 DIAGNOSIS — H2513 Age-related nuclear cataract, bilateral: Secondary | ICD-10-CM

## 2017-10-05 DIAGNOSIS — I10 Essential (primary) hypertension: Secondary | ICD-10-CM | POA: Diagnosis not present

## 2017-10-05 DIAGNOSIS — H35033 Hypertensive retinopathy, bilateral: Secondary | ICD-10-CM

## 2017-10-05 DIAGNOSIS — H353231 Exudative age-related macular degeneration, bilateral, with active choroidal neovascularization: Secondary | ICD-10-CM

## 2017-10-30 NOTE — Progress Notes (Signed)
Cardiology Office Note   Date:  11/01/2017   ID:  [name] [name], DOB September 05, 1936, MRN [id number]  PCP:  [name], MD  Cardiologist:   [name], MD   Chief Complaint  Patient presents with  . Palpitations    History of Present Illness: [name] [name] is a 82 y.o. female who presents for evaluation of difficult to control hypertension.  She had heart block and did have a pacemaker placed.   Since I last saw her she has done well.  She had some episodes in December where her blood pressure would go up and she feels a little presyncopal.  A low-dose of Xanax of her Norvasc.  She might hydrate.  The spells would go away.  She had only one episode in January and was given some metoprolol but she did not start this.  He came back to our blood pressure clinic and her blood pressure diary was excellent as it is today.  She is not really feeling any palpitations, presyncope or syncope.  She has no chest pressure, neck or arm discomfort.  She is had no weight gain or edema.   Past Medical History:  Diagnosis Date  . Anxiety   . Broken arm Right  . Broken leg   . Complete heart block ([place])   . Hypertension   . Postural dizziness     Past Surgical History:  Procedure Laterality Date  . EP IMPLANTABLE DEVICE N/A 01/16/2015   MDT Adapta L PPM implanted by Dr Rayann Heman for complete heart block  . IR GENERIC HISTORICAL  09/15/2016   IR RADIOLOGIST EVAL & MGMT 09/15/2016 [facility]  . IR GENERIC HISTORICAL  09/28/2016   IR KYPHO THORACIC WITH BONE BIOPSY 09/28/2016 Luanne Bras, MD [facility]  . IR GENERIC HISTORICAL  10/14/2016   IR RADIOLOGIST EVAL & MGMT 10/14/2016 [facility]  . PACEMAKER INSERTION N/A 01/2015     Current Outpatient Medications  Medication Sig Dispense Refill  . acetaminophen (TYLENOL) 500 MG tablet Take 500 mg by mouth every 6 (six) hours as needed for mild pain.    Marland Kitchen ALPRAZolam (XANAX)  0.25 MG tablet Take 0.125 tablets by mouth daily as needed for anxiety.     Marland Kitchen amLODipine (NORVASC) 5 MG tablet TAKE 1 TABLET ONCE DAILY AND MAY TAKE ADDITIONAL HALF TABLET DAILY IFNEEDED. 45 tablet 11  . aspirin 325 MG tablet Take 650 mg by mouth at bedtime as needed for moderate pain. Will take at bedtime if needed for pain    . aspirin 81 MG tablet Take 81 mg by mouth daily.    Marland Kitchen Besifloxacin HCl (BESIVANCE) 0.6 % SUSP Apply 1 drop to eye 4 (four) times daily. For 2 days after injection    . calcium carbonate (TUMS EX) 750 MG chewable tablet Chew 1 tablet by mouth 2 (two) times daily as needed.     . Cholecalciferol (VITAMIN D3) 2000 units TABS Take 1 tablet by mouth daily.    . famotidine (PEPCID) 20 MG tablet Take 20 mg by mouth daily.    . fluticasone (FLONASE) 50 MCG/ACT nasal spray Place 2 sprays into both nostrils daily as needed for allergies.     Marland Kitchen lidocaine (LIDODERM) 5 % Place 1 patch onto the skin daily as needed. Remove & Discard patch within 12 hours or as directed by MD    . Magnesium Hydroxide (PHILLIPS MILK OF MAGNESIA PO) Take 1-2 tablets by mouth 2 (two) times daily as needed.    Marland Kitchen  Multiple Vitamins-Minerals (ICAPS MV PO) Take 1 tablet by mouth daily.    Marland Kitchen PRESCRIPTION MEDICATION Place into both eyes every 30 (thirty) days. Avastin injection in left eye every 4 weeks by Dr Hilma Favors Next injection due 10-06-2016      No current facility-administered medications for this visit.     Allergies:   Hctz [hydrochlorothiazide]; Losartan; Macrolides and ketolides; Benazepril; and Penicillins    ROS:  Please see the history of present illness.   Otherwise, review of systems are positive for none.   All other systems are reviewed and negative.    PHYSICAL EXAM: VS:  BP (!) 146/72   Pulse 74   Ht 5\' 4"  (1.626 m)   Wt 182 lb 9.6 oz (82.8 kg)   BMI 31.34 kg/m  , BMI Body mass index is 31.34 kg/m.  GENERAL:  Well appearing NECK:  No jugular venous distention, waveform within  normal limits, carotid upstroke brisk and symmetric, no bruits, no thyromegaly LUNGS:  Clear to auscultation bilaterally CHEST:  Unremarkable HEART:  PMI not displaced or sustained,S1 and S2 within normal limits, no S3, no S4, no clicks, no rubs, no murmurs ABD:  Flat, positive bowel sounds normal in frequency in pitch, no bruits, no rebound, no guarding, no midline pulsatile mass, no hepatomegaly, no splenomegaly EXT:  2 plus pulses throughout, no edema, no cyanosis no clubbing    EKG:  EKG is  tion ordered today. Sinus rhythm, first-degree AV block, ventricular pacing 100% capture.   Recent Labs: No results found for requested labs within last 8760 hours.      Wt Readings from Last 3 Encounters:  11/01/17 182 lb 9.6 oz (82.8 kg)  04/11/17 190 lb 9.6 oz (86.5 kg)  10/12/16 185 lb 12.8 oz (84.3 kg)      Other studies Reviewed: Additional studies/ records that were reviewed today include: None Review of the above records demonstrates:      ASSESSMENT AND PLAN:  Complete heart block/Status post pacemaker She is up-to-date with pacemaker follow up.  No change in therapy is indicated.  HTN Her blood pressure is well controlled.  She will continue the meds as listed.    Current medicines are reviewed at length with the patient today.  The patient does not have concerns regarding medicines.  The following changes have been made:  None  Labs/ tests ordered today include:  None  Orders Placed This Encounter  Procedures  . EKG 12-Lead     Disposition:   FU with me in 12 months.     Signed, [name], MD  11/01/2017 10:57 AM    [facility]

## 2017-11-01 ENCOUNTER — Encounter: Payer: Self-pay | Admitting: Cardiology

## 2017-11-01 ENCOUNTER — Ambulatory Visit: Payer: Medicare Other | Admitting: Cardiology

## 2017-11-01 VITALS — BP 146/72 | HR 74 | Ht 64.0 in | Wt 182.6 lb

## 2017-11-01 DIAGNOSIS — I10 Essential (primary) hypertension: Secondary | ICD-10-CM

## 2017-11-01 DIAGNOSIS — R002 Palpitations: Secondary | ICD-10-CM

## 2017-11-01 DIAGNOSIS — I441 Atrioventricular block, second degree: Secondary | ICD-10-CM

## 2017-11-01 NOTE — Patient Instructions (Signed)
Medication Instructions:  Continue current medications  If you need a refill on your cardiac medications before your next appointment, please call your pharmacy.  Labwork: None Ordered   Testing/Procedures: None Ordered  Follow-Up: Your physician wants you to follow-up in: 1 Year. You should receive a reminder letter in the mail two months in advance. If you do not receive a letter, please call our office [phone].    Thank you for choosing [facility]!!      

## 2017-11-02 ENCOUNTER — Encounter (INDEPENDENT_AMBULATORY_CARE_PROVIDER_SITE_OTHER): Payer: Medicare Other | Admitting: Ophthalmology

## 2017-11-02 DIAGNOSIS — H35033 Hypertensive retinopathy, bilateral: Secondary | ICD-10-CM

## 2017-11-02 DIAGNOSIS — H353231 Exudative age-related macular degeneration, bilateral, with active choroidal neovascularization: Secondary | ICD-10-CM | POA: Diagnosis not present

## 2017-11-02 DIAGNOSIS — H2513 Age-related nuclear cataract, bilateral: Secondary | ICD-10-CM | POA: Diagnosis not present

## 2017-11-02 DIAGNOSIS — I10 Essential (primary) hypertension: Secondary | ICD-10-CM | POA: Diagnosis not present

## 2017-11-02 DIAGNOSIS — H43813 Vitreous degeneration, bilateral: Secondary | ICD-10-CM

## 2017-11-14 ENCOUNTER — Telehealth: Payer: Self-pay | Admitting: Cardiology

## 2017-11-14 NOTE — Telephone Encounter (Signed)
Returned call to patient.she stated since noon today she feels bad,weak and dizzy.Stated she don't feel right.Stated she felt like she was going to pass out.She has not passed out.B/P ranging 136/80,119/80,142/78,122/78 pulse 69,66.No chest pain.No sob.Advised to see PCP.Stated she would like appointment here.Appointment scheduled with Almyra Deforest PA 11/17/17 at 3:00 pm.Advised to go to ED if needed.

## 2017-11-14 NOTE — Telephone Encounter (Signed)
Patient calling, states that she dizzy and feels like she will pass out. Patient states that she is not sure what is wrong but feels that something is wrong. Patient needs some recommendations.

## 2017-11-16 ENCOUNTER — Ambulatory Visit (INDEPENDENT_AMBULATORY_CARE_PROVIDER_SITE_OTHER): Payer: Medicare Other | Admitting: *Deleted

## 2017-11-16 DIAGNOSIS — I442 Atrioventricular block, complete: Secondary | ICD-10-CM

## 2017-11-16 NOTE — Progress Notes (Signed)
Remote pacemaker transmission.   

## 2017-11-17 ENCOUNTER — Encounter: Payer: Self-pay | Admitting: Physician Assistant

## 2017-11-17 ENCOUNTER — Encounter: Payer: Self-pay | Admitting: Cardiology

## 2017-11-17 ENCOUNTER — Ambulatory Visit: Payer: Medicare Other | Admitting: Physician Assistant

## 2017-11-17 VITALS — BP 124/76 | HR 77 | Ht 64.0 in | Wt 181.2 lb

## 2017-11-17 DIAGNOSIS — I779 Disorder of arteries and arterioles, unspecified: Secondary | ICD-10-CM

## 2017-11-17 DIAGNOSIS — I10 Essential (primary) hypertension: Secondary | ICD-10-CM | POA: Diagnosis not present

## 2017-11-17 DIAGNOSIS — I739 Peripheral vascular disease, unspecified: Secondary | ICD-10-CM

## 2017-11-17 DIAGNOSIS — Z950 Presence of cardiac pacemaker: Secondary | ICD-10-CM

## 2017-11-17 DIAGNOSIS — R9431 Abnormal electrocardiogram [ECG] [EKG]: Secondary | ICD-10-CM | POA: Diagnosis not present

## 2017-11-17 DIAGNOSIS — R42 Dizziness and giddiness: Secondary | ICD-10-CM | POA: Diagnosis not present

## 2017-11-17 NOTE — Patient Instructions (Signed)
Medication Instructions: [name] Deforest, PA recommends that you continue on your current medications as directed. Please refer to the Current Medication list given to you today.  Labwork: Your physician recommends that you return for lab work TODAY.  Testing/Procedures: 1. Carotid Duplex - Your physician has requested that you have a carotid duplex. This test is an ultrasound of the carotid arteries in your neck. It looks at blood flow through these arteries that supply the brain with blood. Allow one hour for this exam. There are no restrictions or special instructions.  Follow-up: [name] [name] recommends that you schedule a follow-up appointment in 12 months with Dr Percival Spanish. You will receive a reminder letter in the mail two months in advance. If you don't receive a letter, please call our office to schedule the follow-up appointment.  If you need a refill on your cardiac medications before your next appointment, please call your pharmacy.

## 2017-11-17 NOTE — Progress Notes (Signed)
Cardiology Office Note    Date:  11/19/2017   ID:  [name] [name], DOB 05/02/1936, MRN [id number]  PCP:  [name], MD  Cardiologist:  Dr. Percival Spanish   Chief Complaint  Patient presents with  . Dizziness    seen for Dr. Percival Spanish    History of Present Illness:  [name] [name] is a 82 y.o. female with PMH of HTN, carotid artery disease and CHB s/p MDT PPM.  Patient has some history of dizziness with high blood pressure.  She was seen in the hypertension clinic in December 2018, she was on 5 mg daily of amlodipine with instruction to take an additional 2.5 mg as needed.  She was last seen by Dr. Percival Spanish on 11/01/2017, at which time she was doing well.  One year follow-up was recommended.  Based on phone note, she had significant weakness, dizziness and malaise 11/14/2017.  She also feels like she was going to pass out as well.  She presents today for cardiology evaluation of recurrent dizziness with changing body position and also sudden jump in the blood pressure.  This occurred again on Monday.  She called EMS, however decided not to go to the ED. She took a Xanax Monday night and felt better on Tuesday.  Wednesday and today, she felt poorly again.  Looking at her home blood pressure readings, it appears her blood pressure is very labile.  Blood pressure ranges from 89 up to the 170s.  I would not recommend increase blood pressure medication at this time.  She has some dizziness when she turns her neck, I will obtain a repeat carotid Doppler. Orthostatic vital signs are negative. She was supposed to have a repeat carotid Doppler in 2017, this never happened.  I will also obtain a TSH as well.  Her EKG obtained by EMS is very unusual.  She denies any chest pain at the time however initial strip showed significant ST elevation in lead II and III concerning for inferior STEMI, however a 12-lead EKG obtained less than 3 minutes later showed complete resolution of ST elevation.  Same strip obtained a  few minutes after that showed recurrent ST elevation in lead II and III.  Record ID from all 3 strips matches to each other.  I think this is more likely a computer error than true ACS.  Nevertheless, I decided to obtain a stat troponin as a precaution.   Past Medical History:  Diagnosis Date  . Anxiety   . Broken arm Right  . Broken leg   . Complete heart block ([place])   . Hypertension   . Postural dizziness     Past Surgical History:  Procedure Laterality Date  . EP IMPLANTABLE DEVICE N/A 01/16/2015   MDT Adapta L PPM implanted by Dr Rayann Heman for complete heart block  . IR GENERIC HISTORICAL  09/15/2016   IR RADIOLOGIST EVAL & MGMT 09/15/2016 [facility]  . IR GENERIC HISTORICAL  09/28/2016   IR KYPHO THORACIC WITH BONE BIOPSY 09/28/2016 Luanne Bras, MD [facility]  . IR GENERIC HISTORICAL  10/14/2016   IR RADIOLOGIST EVAL & MGMT 10/14/2016 [facility]  . PACEMAKER INSERTION N/A 01/2015    Current Medications: Outpatient Medications Prior to Visit  Medication Sig Dispense Refill  . acetaminophen (TYLENOL) 500 MG tablet Take 500 mg by mouth every 6 (six) hours as needed for mild pain.    Marland Kitchen ALPRAZolam (XANAX) 0.25 MG tablet Take 0.125 tablets by mouth daily as needed for anxiety.     Marland Kitchen  amLODipine (NORVASC) 5 MG tablet TAKE 1 TABLET ONCE DAILY AND MAY TAKE ADDITIONAL HALF TABLET DAILY IFNEEDED. 45 tablet 11  . aspirin 325 MG tablet Take 650 mg by mouth at bedtime as needed for moderate pain. Will take at bedtime if needed for pain    . aspirin 81 MG tablet Take 81 mg by mouth daily.    Marland Kitchen Besifloxacin HCl (BESIVANCE) 0.6 % SUSP Apply 1 drop to eye 4 (four) times daily. For 2 days after injection    . calcium carbonate (TUMS EX) 750 MG chewable tablet Chew 1 tablet by mouth 2 (two) times daily as needed.     . Cholecalciferol (VITAMIN D3) 2000 units TABS Take 1 tablet by mouth daily.    . famotidine (PEPCID) 20 MG tablet Take 20 mg by mouth daily.    . fluticasone (FLONASE) 50  MCG/ACT nasal spray Place 2 sprays into both nostrils daily as needed for allergies.     Marland Kitchen lidocaine (LIDODERM) 5 % Place 1 patch onto the skin daily as needed. Remove & Discard patch within 12 hours or as directed by MD    . Magnesium Hydroxide (PHILLIPS MILK OF MAGNESIA PO) Take 1-2 tablets by mouth 2 (two) times daily as needed.    . Multiple Vitamins-Minerals (ICAPS MV PO) Take 1 tablet by mouth daily.    Marland Kitchen PRESCRIPTION MEDICATION Place into both eyes every 30 (thirty) days. Avastin injection in left eye every 4 weeks by Dr Hilma Favors Next injection due 10-06-2016      No facility-administered medications prior to visit.      Allergies:   Hctz [hydrochlorothiazide]; Losartan; Macrolides and ketolides; Benazepril; and Penicillins   Social History   Socioeconomic History  . Marital status: Married    Spouse name: None  . Number of children: 2  . Years of education: None  . Highest education level: None  Social Needs  . Financial resource strain: None  . Food insecurity - worry: None  . Food insecurity - inability: None  . Transportation needs - medical: None  . Transportation needs - non-medical: None  Occupational History  . None  Tobacco Use  . Smoking status: Former Smoker    Years: 30.00    Types: Cigarettes    Last attempt to quit: 09/06/1986    Years since quitting: 31.2  . Smokeless tobacco: Never Used  Substance and Sexual Activity  . Alcohol use: Yes    Alcohol/week: 0.0 oz    Comment: occasionally 1 monthly  . Drug use: No  . Sexual activity: None  Other Topics Concern  . None  Social History Narrative   Lives with [name] [name] her husband     Family History:  The patient's family history includes Atrial fibrillation in her father; Heart failure in her mother; Hypertension in her father and mother.   ROS:   Please see the history of present illness.    ROS All other systems reviewed and are negative.   PHYSICAL EXAM:   VS:  BP 124/76 (BP Location: Left Arm,  Patient Position: Sitting, Cuff Size: Normal)   Pulse 77   Ht 5\' 4"  (1.626 m)   Wt 181 lb 3.2 oz (82.2 kg)   SpO2 98%   BMI 31.10 kg/m    GEN: Well nourished, well developed, in no acute distress  HEENT: normal  Neck: no JVD, carotid bruits, or masses Cardiac: RRR; no murmurs, rubs, or gallops,no edema  Respiratory:  clear to auscultation bilaterally, normal work of breathing  GI: soft, nontender, nondistended, + BS MS: no deformity or atrophy  Skin: warm and dry, no rash Neuro:  Alert and Oriented x 3, Strength and sensation are intact Psych: euthymic mood, full affect  Wt Readings from Last 3 Encounters:  11/17/17 181 lb 3.2 oz (82.2 kg)  11/01/17 182 lb 9.6 oz (82.8 kg)  04/11/17 190 lb 9.6 oz (86.5 kg)      Studies/Labs Reviewed:   EKG:  EKG is ordered today.  The ekg ordered today demonstrates atrial sensed ventricularly paced rhythm.  Recent Labs: 11/17/2017: TSH 0.941   Lipid Panel    Component Value Date/Time   CHOL 136 01/16/2015 0523   TRIG 84 01/16/2015 0523   HDL 67 01/16/2015 0523   CHOLHDL 2.0 01/16/2015 0523   VLDL 17 01/16/2015 0523   LDLCALC 52 01/16/2015 0523    Additional studies/ records that were reviewed today include:   Echo 01/15/2015 LV EF: 60% -   65%  Study Conclusions  - Left ventricle: The cavity size was normal. Systolic function was   normal. The estimated ejection fraction was in the range of 60%   to 65%. Wall motion was normal; there were no regional wall   motion abnormalities. Doppler parameters are consistent with   abnormal left ventricular relaxation (grade 1 diastolic   dysfunction). - Left atrium: The atrium was mildly dilated. - Atrial septum: No defect or patent foramen ovale was identified.    ASSESSMENT:    1. Abnormal EKG   2. Essential hypertension   3. Carotid artery disease, unspecified laterality, unspecified type ([place])   4. Pacemaker   5. Dizziness      PLAN:  In order of problems listed  above:  1. Abnormal EKG: EKG strips obtained by EMS sent to show inferior STEMI, however this is unlikely to be true ACS is a 12-lead EKG obtained less than 3 minutes later showed complete normalization of ST segment.  I decided to obtain a troponin as a precaution.  She denied any chest pain at the time of EKG strip.  2. Dizziness: She has very labile blood pressure, I would not recommend on increasing blood pressure medication at this time.  She has 2.5 mg amlodipine as needed on top of her maintenance blood pressure medication.  3. Carotid artery disease: We will need carotid artery ultrasound.  Her dizziness worsens when she turns her head.  I wonder if there is some inner ear issue versus carotid artery disease.  4. Hypertension: Blood pressure stable.  5. Complete heart block s/p Medtronic pacemaker: She had a remote transmission from yesterday, it did not show any significant event on Monday, last episode of tachycardia happened in January.  There is no correlation with her recent dizziness.    Medication Adjustments/Labs and Tests Ordered: Current medicines are reviewed at length with the patient today.  Concerns regarding medicines are outlined above.  Medication changes, Labs and Tests ordered today are listed in the Patient Instructions below. Patient Instructions  Medication Instructions: Almyra Deforest, PA recommends that you continue on your current medications as directed. Please refer to the Current Medication list given to you today.  Labwork: Your physician recommends that you return for lab work TODAY.  Testing/Procedures: 1. Carotid Duplex - Your physician has requested that you have a carotid duplex. This test is an ultrasound of the carotid arteries in your neck. It looks at blood flow through these arteries that supply the brain with blood. Allow one hour for this  exam. There are no restrictions or special instructions.  Follow-up: Isaac Laud recommends that you schedule a  follow-up appointment in 12 months with Dr Percival Spanish. You will receive a reminder letter in the mail two months in advance. If you don't receive a letter, please call our office to schedule the follow-up appointment.  If you need a refill on your cardiac medications before your next appointment, please call your pharmacy.    Hilbert Corrigan, [place]  11/19/2017 12:55 PM    [facility] [address], [place], [place]  [id number] Phone: [phone]; Fax: [phone]

## 2017-11-18 LAB — TSH: TSH: 0.941 u[IU]/mL (ref 0.450–4.500)

## 2017-11-18 LAB — TROPONIN I: Troponin I: <0.01 ng/mL (ref 0.00–0.04)

## 2017-11-19 ENCOUNTER — Encounter: Payer: Self-pay | Admitting: Physician Assistant

## 2017-11-21 ENCOUNTER — Ambulatory Visit (HOSPITAL_COMMUNITY): Admission: RE | Admit: 2017-11-21 | Payer: Self-pay | Source: Ambulatory Visit

## 2017-11-23 ENCOUNTER — Telehealth: Payer: Self-pay | Admitting: Physician Assistant

## 2017-11-23 ENCOUNTER — Ambulatory Visit (HOSPITAL_COMMUNITY)
Admission: RE | Admit: 2017-11-23 | Discharge: 2017-11-23 | Disposition: A | Discharge status: Home / Self Care | Payer: Medicare Other | Source: Ambulatory Visit | Attending: Cardiovascular Disease | Admitting: Cardiovascular Disease

## 2017-11-23 DIAGNOSIS — I6523 Occlusion and stenosis of bilateral carotid arteries: Secondary | ICD-10-CM | POA: Insufficient documentation

## 2017-11-23 DIAGNOSIS — I10 Essential (primary) hypertension: Secondary | ICD-10-CM | POA: Insufficient documentation

## 2017-11-23 DIAGNOSIS — I779 Disorder of arteries and arterioles, unspecified: Secondary | ICD-10-CM | POA: Diagnosis present

## 2017-11-23 DIAGNOSIS — Z87891 Personal history of nicotine dependence: Secondary | ICD-10-CM | POA: Diagnosis not present

## 2017-11-23 DIAGNOSIS — R42 Dizziness and giddiness: Secondary | ICD-10-CM

## 2017-11-23 DIAGNOSIS — I739 Peripheral vascular disease, unspecified: Secondary | ICD-10-CM

## 2017-11-23 NOTE — Telephone Encounter (Signed)
Spoke with patient; notified of lab results but wants her electrolytes checked. I told her I would ask Isaac Laud and get back to her. She voiced understanding.

## 2017-11-23 NOTE — Telephone Encounter (Signed)
Patient returning call for lab results. Will be available until 3:30pm

## 2017-11-24 LAB — CUP PACEART REMOTE DEVICE CHECK
Battery Impedance: 155 Ohm
Battery Voltage: 2.78 V
Brady Statistic AP VS Percent: 0 %
Brady Statistic AS VP Percent: 83 %
Brady Statistic AS VS Percent: 0 %
Implantable Lead Location: 753859
Implantable Lead Location: 753860
Implantable Lead Model: 5076
Implantable Lead Model: 5092
Lead Channel Pacing Threshold Amplitude: 0.875 V
Lead Channel Pacing Threshold Pulse Width: 0.4 ms
Lead Channel Pacing Threshold Pulse Width: 0.4 ms
Lead Channel Setting Pacing Pulse Width: 0.4 ms
MDC IDC LEAD IMPLANT DT: 20160512
MDC IDC LEAD IMPLANT DT: 20160512
MDC IDC MSMT BATTERY REMAINING LONGEVITY: 128 mo
MDC IDC MSMT LEADCHNL RA IMPEDANCE VALUE: 520 Ohm
MDC IDC MSMT LEADCHNL RV IMPEDANCE VALUE: 727 Ohm
MDC IDC MSMT LEADCHNL RV PACING THRESHOLD AMPLITUDE: 0.75 V
MDC IDC PG IMPLANT DT: 20160512
MDC IDC SESS DTM: 20190313133100
MDC IDC SET LEADCHNL RA PACING AMPLITUDE: 2 V
MDC IDC SET LEADCHNL RV PACING AMPLITUDE: 2.5 V
MDC IDC SET LEADCHNL RV SENSING SENSITIVITY: 2.8 mV
MDC IDC STAT BRADY AP VP PERCENT: 17 %

## 2017-11-25 NOTE — Telephone Encounter (Signed)
That is fine with me. She did have evaluation for dizziness recently, we can check to see if electrolyte is ok.

## 2017-11-25 NOTE — Telephone Encounter (Signed)
Patient notified of Hao's recommendations

## 2017-11-29 LAB — BASIC METABOLIC PANEL
BUN/Creatinine Ratio: 15 (ref 12–28)
BUN: 14 mg/dL (ref 8–27)
CALCIUM: 10.2 mg/dL (ref 8.7–10.3)
CO2: 23 mmol/L (ref 20–29)
CREATININE: 0.91 mg/dL (ref 0.57–1.00)
Chloride: 103 mmol/L (ref 96–106)
GFR calc Af Amer: 69 mL/min/{1.73_m2} (ref >59)
GFR calc non Af Amer: 60 mL/min/{1.73_m2} (ref >59)
GLUCOSE: 95 mg/dL (ref 65–99)
POTASSIUM: 4.8 mmol/L (ref 3.5–5.2)
SODIUM: 142 mmol/L (ref 134–144)

## 2017-11-30 ENCOUNTER — Encounter (INDEPENDENT_AMBULATORY_CARE_PROVIDER_SITE_OTHER): Payer: Medicare Other | Admitting: Ophthalmology

## 2017-11-30 DIAGNOSIS — H35033 Hypertensive retinopathy, bilateral: Secondary | ICD-10-CM

## 2017-11-30 DIAGNOSIS — I10 Essential (primary) hypertension: Secondary | ICD-10-CM | POA: Diagnosis not present

## 2017-11-30 DIAGNOSIS — H353231 Exudative age-related macular degeneration, bilateral, with active choroidal neovascularization: Secondary | ICD-10-CM

## 2017-11-30 DIAGNOSIS — H2513 Age-related nuclear cataract, bilateral: Secondary | ICD-10-CM

## 2017-11-30 DIAGNOSIS — H43813 Vitreous degeneration, bilateral: Secondary | ICD-10-CM | POA: Diagnosis not present

## 2017-11-30 NOTE — Addendum Note (Signed)
Addended by: Diana Eves on: 11/30/2017 03:56 PM   Modules accepted: Orders

## 2017-12-13 ENCOUNTER — Telehealth: Payer: Self-pay | Admitting: Cardiology

## 2017-12-13 NOTE — Telephone Encounter (Signed)
New message    Pt c/o BP issue:  1. What are your last 5 BP readings? 133/110 2. Are you having any other symptoms (ex. Dizziness, headache, blurred vision, passed out)? Feels weak 3. What is your medication issue? Patient feels BP too high

## 2017-12-13 NOTE — Telephone Encounter (Signed)
Returned call to patient of Dr. Percival Spanish. She reports her BP was 133/110 and HR 70s a little after 12 today. She ate lunch and took half xanax. She feels better now. She has had a lot of "upset" today - her sister passed away last night. She has felt woozy today.   Informed patient that med changes would not be made off a single elevated BP reading. Suggested she rest, recheck her BP and I will call her back for an update.

## 2017-12-13 NOTE — Telephone Encounter (Signed)
Returned call to patient.  A recheck of her VS is as follows: BP 125/82  HR 75  She still feels woozy. Advised to rest, stay hydrated. Call back if further concerns

## 2018-01-04 ENCOUNTER — Encounter (INDEPENDENT_AMBULATORY_CARE_PROVIDER_SITE_OTHER): Payer: Medicare Other | Admitting: Ophthalmology

## 2018-01-04 DIAGNOSIS — H353231 Exudative age-related macular degeneration, bilateral, with active choroidal neovascularization: Secondary | ICD-10-CM

## 2018-01-04 DIAGNOSIS — H35033 Hypertensive retinopathy, bilateral: Secondary | ICD-10-CM

## 2018-01-04 DIAGNOSIS — I10 Essential (primary) hypertension: Secondary | ICD-10-CM | POA: Diagnosis not present

## 2018-01-04 DIAGNOSIS — H43813 Vitreous degeneration, bilateral: Secondary | ICD-10-CM

## 2018-01-04 DIAGNOSIS — H2513 Age-related nuclear cataract, bilateral: Secondary | ICD-10-CM

## 2018-02-06 ENCOUNTER — Encounter (INDEPENDENT_AMBULATORY_CARE_PROVIDER_SITE_OTHER): Payer: Medicare Other | Admitting: Ophthalmology

## 2018-02-06 DIAGNOSIS — I10 Essential (primary) hypertension: Secondary | ICD-10-CM

## 2018-02-06 DIAGNOSIS — H35033 Hypertensive retinopathy, bilateral: Secondary | ICD-10-CM | POA: Diagnosis not present

## 2018-02-06 DIAGNOSIS — H43813 Vitreous degeneration, bilateral: Secondary | ICD-10-CM

## 2018-02-06 DIAGNOSIS — H353231 Exudative age-related macular degeneration, bilateral, with active choroidal neovascularization: Secondary | ICD-10-CM | POA: Diagnosis not present

## 2018-02-08 ENCOUNTER — Encounter (INDEPENDENT_AMBULATORY_CARE_PROVIDER_SITE_OTHER): Payer: Medicare Other | Admitting: Ophthalmology

## 2018-02-15 ENCOUNTER — Ambulatory Visit (INDEPENDENT_AMBULATORY_CARE_PROVIDER_SITE_OTHER): Payer: Medicare Other | Admitting: *Deleted

## 2018-02-15 DIAGNOSIS — I442 Atrioventricular block, complete: Secondary | ICD-10-CM | POA: Diagnosis not present

## 2018-02-15 NOTE — Progress Notes (Signed)
Remote pacemaker transmission.   

## 2018-02-16 ENCOUNTER — Encounter: Payer: Self-pay | Admitting: Cardiology

## 2018-02-20 LAB — CUP PACEART REMOTE DEVICE CHECK
Battery Remaining Longevity: 119 mo
Brady Statistic AS VS Percent: 0 %
Date Time Interrogation Session: 20190612133811
Implantable Lead Implant Date: 20160512
Implantable Lead Implant Date: 20160512
Implantable Lead Location: 753859
Implantable Pulse Generator Implant Date: 20160512
Lead Channel Impedance Value: 709 Ohm
Lead Channel Pacing Threshold Amplitude: 0.75 V
Lead Channel Setting Pacing Amplitude: 2 V
Lead Channel Setting Sensing Sensitivity: 2.8 mV
MDC IDC LEAD LOCATION: 753860
MDC IDC MSMT BATTERY IMPEDANCE: 202 Ohm
MDC IDC MSMT BATTERY VOLTAGE: 2.78 V
MDC IDC MSMT LEADCHNL RA IMPEDANCE VALUE: 512 Ohm
MDC IDC MSMT LEADCHNL RA PACING THRESHOLD AMPLITUDE: 0.875 V
MDC IDC MSMT LEADCHNL RA PACING THRESHOLD PULSEWIDTH: 0.4 ms
MDC IDC MSMT LEADCHNL RV PACING THRESHOLD PULSEWIDTH: 0.4 ms
MDC IDC SET LEADCHNL RV PACING AMPLITUDE: 2.5 V
MDC IDC SET LEADCHNL RV PACING PULSEWIDTH: 0.4 ms
MDC IDC STAT BRADY AP VP PERCENT: 18 %
MDC IDC STAT BRADY AP VS PERCENT: 0 %
MDC IDC STAT BRADY AS VP PERCENT: 82 %

## 2018-03-06 ENCOUNTER — Encounter (INDEPENDENT_AMBULATORY_CARE_PROVIDER_SITE_OTHER): Payer: Medicare Other | Admitting: Ophthalmology

## 2018-03-06 DIAGNOSIS — H43813 Vitreous degeneration, bilateral: Secondary | ICD-10-CM

## 2018-03-06 DIAGNOSIS — H353231 Exudative age-related macular degeneration, bilateral, with active choroidal neovascularization: Secondary | ICD-10-CM

## 2018-03-06 DIAGNOSIS — I10 Essential (primary) hypertension: Secondary | ICD-10-CM

## 2018-03-06 DIAGNOSIS — H35033 Hypertensive retinopathy, bilateral: Secondary | ICD-10-CM | POA: Diagnosis not present

## 2018-04-12 ENCOUNTER — Encounter (INDEPENDENT_AMBULATORY_CARE_PROVIDER_SITE_OTHER): Payer: Medicare Other | Admitting: Ophthalmology

## 2018-04-12 DIAGNOSIS — H43813 Vitreous degeneration, bilateral: Secondary | ICD-10-CM

## 2018-04-12 DIAGNOSIS — I10 Essential (primary) hypertension: Secondary | ICD-10-CM | POA: Diagnosis not present

## 2018-04-12 DIAGNOSIS — H35033 Hypertensive retinopathy, bilateral: Secondary | ICD-10-CM

## 2018-04-12 DIAGNOSIS — H2513 Age-related nuclear cataract, bilateral: Secondary | ICD-10-CM

## 2018-04-12 DIAGNOSIS — H353231 Exudative age-related macular degeneration, bilateral, with active choroidal neovascularization: Secondary | ICD-10-CM

## 2018-05-10 ENCOUNTER — Other Ambulatory Visit: Payer: Self-pay | Admitting: Cardiology

## 2018-05-10 ENCOUNTER — Encounter (INDEPENDENT_AMBULATORY_CARE_PROVIDER_SITE_OTHER): Payer: Medicare Other | Admitting: Ophthalmology

## 2018-05-10 DIAGNOSIS — H35033 Hypertensive retinopathy, bilateral: Secondary | ICD-10-CM

## 2018-05-10 DIAGNOSIS — I10 Essential (primary) hypertension: Secondary | ICD-10-CM

## 2018-05-10 DIAGNOSIS — H353231 Exudative age-related macular degeneration, bilateral, with active choroidal neovascularization: Secondary | ICD-10-CM

## 2018-05-10 DIAGNOSIS — D3131 Benign neoplasm of right choroid: Secondary | ICD-10-CM

## 2018-05-10 DIAGNOSIS — H43813 Vitreous degeneration, bilateral: Secondary | ICD-10-CM | POA: Diagnosis not present

## 2018-05-10 DIAGNOSIS — H2513 Age-related nuclear cataract, bilateral: Secondary | ICD-10-CM

## 2018-05-10 NOTE — Telephone Encounter (Signed)
Rx request sent to pharmacy.  

## 2018-05-17 ENCOUNTER — Ambulatory Visit (INDEPENDENT_AMBULATORY_CARE_PROVIDER_SITE_OTHER): Payer: Medicare Other | Admitting: *Deleted

## 2018-05-17 DIAGNOSIS — I441 Atrioventricular block, second degree: Secondary | ICD-10-CM

## 2018-05-17 NOTE — Progress Notes (Signed)
Remote pacemaker transmission.   

## 2018-05-22 ENCOUNTER — Ambulatory Visit: Payer: Medicare Other | Admitting: Internal Medicine

## 2018-05-22 ENCOUNTER — Encounter: Payer: Self-pay | Admitting: Internal Medicine

## 2018-05-22 VITALS — BP 144/78 | HR 80 | Ht 66.0 in | Wt 189.6 lb

## 2018-05-22 DIAGNOSIS — I442 Atrioventricular block, complete: Secondary | ICD-10-CM | POA: Diagnosis not present

## 2018-05-22 DIAGNOSIS — I10 Essential (primary) hypertension: Secondary | ICD-10-CM

## 2018-05-22 DIAGNOSIS — Z950 Presence of cardiac pacemaker: Secondary | ICD-10-CM | POA: Diagnosis not present

## 2018-05-22 LAB — CUP PACEART INCLINIC DEVICE CHECK
Battery Impedance: 202 Ohm
Battery Remaining Longevity: 117 mo
Battery Voltage: 2.78 V
Brady Statistic AP VP Percent: 17 %
Brady Statistic AP VS Percent: 0 %
Date Time Interrogation Session: 20190916155627
Implantable Lead Implant Date: 20160512
Implantable Lead Location: 753859
Implantable Lead Location: 753860
Implantable Pulse Generator Implant Date: 20160512
Lead Channel Pacing Threshold Amplitude: 0.75 V
Lead Channel Pacing Threshold Amplitude: 1 V
Lead Channel Pacing Threshold Pulse Width: 0.4 ms
Lead Channel Setting Sensing Sensitivity: 2.8 mV
MDC IDC LEAD IMPLANT DT: 20160512
MDC IDC MSMT LEADCHNL RA IMPEDANCE VALUE: 529 Ohm
MDC IDC MSMT LEADCHNL RA SENSING INTR AMPL: 1.4 mV
MDC IDC MSMT LEADCHNL RV IMPEDANCE VALUE: 616 Ohm
MDC IDC MSMT LEADCHNL RV PACING THRESHOLD PULSEWIDTH: 0.4 ms
MDC IDC SET LEADCHNL RA PACING AMPLITUDE: 2 V
MDC IDC SET LEADCHNL RV PACING AMPLITUDE: 2.5 V
MDC IDC SET LEADCHNL RV PACING PULSEWIDTH: 0.4 ms
MDC IDC STAT BRADY AS VP PERCENT: 83 %
MDC IDC STAT BRADY AS VS PERCENT: 0 %

## 2018-05-22 NOTE — Progress Notes (Signed)
PCP: [name], MD Primary Cardiologist: Dr Percival Spanish Primary EP:  Dr Rayann Heman  [name] [name] is a 82 y.o. female who presents today for routine electrophysiology followup.  Since last being seen in our clinic, the patient reports doing very well.  Today, she denies symptoms of palpitations, chest pain, shortness of breath,  lower extremity edema, dizziness, presyncope, or syncope.  The patient is otherwise without complaint today.   Past Medical History:  Diagnosis Date  . Anxiety   . Broken arm Right  . Broken leg   . Complete heart block ([place])   . Hypertension   . Postural dizziness    Past Surgical History:  Procedure Laterality Date  . EP IMPLANTABLE DEVICE N/A 01/16/2015   MDT Adapta L PPM implanted by Dr Rayann Heman for complete heart block  . IR GENERIC HISTORICAL  09/15/2016   IR RADIOLOGIST EVAL & MGMT 09/15/2016 [facility]  . IR GENERIC HISTORICAL  09/28/2016   IR KYPHO THORACIC WITH BONE BIOPSY 09/28/2016 Luanne Bras, MD [facility]  . IR GENERIC HISTORICAL  10/14/2016   IR RADIOLOGIST EVAL & MGMT 10/14/2016 [facility]  . PACEMAKER INSERTION N/A 01/2015    ROS- all systems are reviewed and negative except as per HPI above  Current Outpatient Medications  Medication Sig Dispense Refill  . acetaminophen (TYLENOL) 500 MG tablet Take 500 mg by mouth every 6 (six) hours as needed for mild pain.    Marland Kitchen ALPRAZolam (XANAX) 0.25 MG tablet Take 0.125 tablets by mouth daily as needed for anxiety.     Marland Kitchen amLODipine (NORVASC) 5 MG tablet TAKE 1 TABLET ONCE DAILY AND MAY TAKE ADDITIONAL HALF TABLET DAILY IFNEEDED. 90 tablet 0  . aspirin 325 MG tablet Take 650 mg by mouth at bedtime as needed for moderate pain. Will take at bedtime if needed for pain    . aspirin 81 MG tablet Take 81 mg by mouth daily.    Marland Kitchen Besifloxacin HCl (BESIVANCE) 0.6 % SUSP Apply 1 drop to eye 4 (four) times daily. For 2 days after injection    . calcium carbonate (TUMS EX) 750 MG chewable tablet Chew 1  tablet by mouth 2 (two) times daily as needed.     . Cholecalciferol (VITAMIN D3) 2000 units TABS Take 1 tablet by mouth daily.    . famotidine (PEPCID) 20 MG tablet Take 20 mg by mouth daily.    . fluticasone (FLONASE) 50 MCG/ACT nasal spray Place 2 sprays into both nostrils daily as needed for allergies.     Marland Kitchen lidocaine (LIDODERM) 5 % Place 1 patch onto the skin daily as needed. Remove & Discard patch within 12 hours or as directed by MD    . Magnesium Hydroxide (PHILLIPS MILK OF MAGNESIA PO) Take 1-2 tablets by mouth 2 (two) times daily as needed.    . Multiple Vitamins-Minerals (ICAPS MV PO) Take 1 tablet by mouth daily.    Marland Kitchen PRESCRIPTION MEDICATION Place into both eyes every 30 (thirty) days. Avastin injection in left eye every 4 weeks by Dr Hilma Favors Next injection due 10-06-2016      No current facility-administered medications for this visit.     Physical Exam: Vitals:   05/22/18 1413  BP: (!) 144/78  Pulse: 80  SpO2: 98%  Weight: 189 lb 9.6 oz (86 kg)  Height: 5\' 6"  (1.676 m)    GEN- The patient is well appearing, alert and oriented x 3 today.   Head- normocephalic, atraumatic Eyes-  Sclera clear, conjunctiva  pink Ears- hearing intact Oropharynx- clear Lungs- Clear to ausculation bilaterally, normal work of breathing Chest- pacemaker pocket is well healed Heart- Regular rate and rhythm, no murmurs, rubs or gallops, PMI not laterally displaced GI- soft, NT, ND, + BS Extremities- no clubbing, cyanosis, or edema  Pacemaker interrogation- reviewed in detail today,  See PACEART report  ekg tracing ordered today is personally reviewed and shows sinus rhythm with V pacing, PR 230 msec  Assessment and Plan:  1. Symptomatic complete heart block Normal pacemaker function See Pace Art report No changes today  2. HTN Stable No change required today  carelink Follow-up with EP PA every year I will see when needed  [name] MD, [facility] 05/22/2018 2:15 PM

## 2018-05-22 NOTE — Patient Instructions (Signed)
Medication Instructions:  Your physician recommends that you continue on your current medications as directed. Please refer to the Current Medication list given to you today.  Labwork: None ordered.  Testing/Procedures: None ordered.  Follow-Up: Your physician wants you to follow-up in: one year with Tommye Standard, PA.   You will receive a reminder letter in the mail two months in advance. If you don't receive a letter, please call our office to schedule the follow-up appointment.  Remote monitoring is used to monitor your Pacemaker from home. This monitoring reduces the number of office visits required to check your device to one time per year. It allows [place] to keep an eye on the functioning of your device to ensure it is working properly. You are scheduled for a device check from home on 08/16/2018. You may send your transmission at any time that day. If you have a wireless device, the transmission will be sent automatically. After your physician reviews your transmission, you will receive a postcard with your next transmission date.  Any Other Special Instructions Will Be Listed Below (If Applicable).  If you need a refill on your cardiac medications before your next appointment, please call your pharmacy.

## 2018-05-26 ENCOUNTER — Ambulatory Visit: Payer: Self-pay | Admitting: Cardiology

## 2018-06-07 ENCOUNTER — Encounter (INDEPENDENT_AMBULATORY_CARE_PROVIDER_SITE_OTHER): Payer: Medicare Other | Admitting: Ophthalmology

## 2018-06-07 DIAGNOSIS — H353231 Exudative age-related macular degeneration, bilateral, with active choroidal neovascularization: Secondary | ICD-10-CM

## 2018-06-07 DIAGNOSIS — I10 Essential (primary) hypertension: Secondary | ICD-10-CM | POA: Diagnosis not present

## 2018-06-07 DIAGNOSIS — H43813 Vitreous degeneration, bilateral: Secondary | ICD-10-CM | POA: Diagnosis not present

## 2018-06-07 DIAGNOSIS — D3131 Benign neoplasm of right choroid: Secondary | ICD-10-CM

## 2018-06-07 DIAGNOSIS — H35033 Hypertensive retinopathy, bilateral: Secondary | ICD-10-CM

## 2018-06-07 DIAGNOSIS — H2513 Age-related nuclear cataract, bilateral: Secondary | ICD-10-CM

## 2018-06-07 LAB — CUP PACEART REMOTE DEVICE CHECK
Battery Remaining Longevity: 120 mo
Brady Statistic AS VP Percent: 83 %
Implantable Lead Implant Date: 20160512
Implantable Lead Location: 753860
Implantable Pulse Generator Implant Date: 20160512
Lead Channel Pacing Threshold Amplitude: 0.875 V
Lead Channel Pacing Threshold Pulse Width: 0.4 ms
Lead Channel Setting Pacing Amplitude: 2 V
Lead Channel Setting Sensing Sensitivity: 2.8 mV
MDC IDC LEAD IMPLANT DT: 20160512
MDC IDC LEAD LOCATION: 753859
MDC IDC MSMT BATTERY IMPEDANCE: 202 Ohm
MDC IDC MSMT BATTERY VOLTAGE: 2.78 V
MDC IDC MSMT LEADCHNL RA IMPEDANCE VALUE: 520 Ohm
MDC IDC MSMT LEADCHNL RA PACING THRESHOLD AMPLITUDE: 1 V
MDC IDC MSMT LEADCHNL RA PACING THRESHOLD PULSEWIDTH: 0.4 ms
MDC IDC MSMT LEADCHNL RV IMPEDANCE VALUE: 727 Ohm
MDC IDC SESS DTM: 20190911145419
MDC IDC SET LEADCHNL RV PACING AMPLITUDE: 2.5 V
MDC IDC SET LEADCHNL RV PACING PULSEWIDTH: 0.4 ms
MDC IDC STAT BRADY AP VP PERCENT: 17 %
MDC IDC STAT BRADY AP VS PERCENT: 0 %
MDC IDC STAT BRADY AS VS PERCENT: 0 %

## 2018-07-05 ENCOUNTER — Encounter (INDEPENDENT_AMBULATORY_CARE_PROVIDER_SITE_OTHER): Payer: Medicare Other | Admitting: Ophthalmology

## 2018-07-06 ENCOUNTER — Encounter (INDEPENDENT_AMBULATORY_CARE_PROVIDER_SITE_OTHER): Payer: Medicare Other | Admitting: Ophthalmology

## 2018-07-06 DIAGNOSIS — H35033 Hypertensive retinopathy, bilateral: Secondary | ICD-10-CM | POA: Diagnosis not present

## 2018-07-06 DIAGNOSIS — I10 Essential (primary) hypertension: Secondary | ICD-10-CM | POA: Diagnosis not present

## 2018-07-06 DIAGNOSIS — H353231 Exudative age-related macular degeneration, bilateral, with active choroidal neovascularization: Secondary | ICD-10-CM | POA: Diagnosis not present

## 2018-07-06 DIAGNOSIS — D3131 Benign neoplasm of right choroid: Secondary | ICD-10-CM

## 2018-07-06 DIAGNOSIS — H43813 Vitreous degeneration, bilateral: Secondary | ICD-10-CM | POA: Diagnosis not present

## 2018-07-13 ENCOUNTER — Telehealth: Payer: Self-pay | Admitting: Cardiology

## 2018-07-13 NOTE — Telephone Encounter (Signed)
No expected drug interactions between her cardiac medications and fosamax  Okay to take as prescribed.

## 2018-07-13 NOTE — Telephone Encounter (Signed)
Spoke to patient, aware and verbalized understanding.    

## 2018-07-13 NOTE — Telephone Encounter (Signed)
° ° °  Patient has questions about starting Fosamax and Calcium. Patient wants to know if theses medications are safe to take with heart meds

## 2018-07-29 ENCOUNTER — Other Ambulatory Visit: Payer: Self-pay | Admitting: Cardiology

## 2018-08-01 ENCOUNTER — Encounter (INDEPENDENT_AMBULATORY_CARE_PROVIDER_SITE_OTHER): Payer: Medicare Other | Admitting: Ophthalmology

## 2018-08-01 DIAGNOSIS — D3131 Benign neoplasm of right choroid: Secondary | ICD-10-CM

## 2018-08-01 DIAGNOSIS — I10 Essential (primary) hypertension: Secondary | ICD-10-CM

## 2018-08-01 DIAGNOSIS — H43813 Vitreous degeneration, bilateral: Secondary | ICD-10-CM

## 2018-08-01 DIAGNOSIS — H353231 Exudative age-related macular degeneration, bilateral, with active choroidal neovascularization: Secondary | ICD-10-CM

## 2018-08-01 DIAGNOSIS — H35033 Hypertensive retinopathy, bilateral: Secondary | ICD-10-CM | POA: Diagnosis not present

## 2018-08-16 ENCOUNTER — Ambulatory Visit (INDEPENDENT_AMBULATORY_CARE_PROVIDER_SITE_OTHER): Payer: Medicare Other

## 2018-08-16 DIAGNOSIS — I442 Atrioventricular block, complete: Secondary | ICD-10-CM | POA: Diagnosis not present

## 2018-08-16 DIAGNOSIS — I10 Essential (primary) hypertension: Secondary | ICD-10-CM

## 2018-08-17 NOTE — Progress Notes (Signed)
Remote pacemaker transmission.   

## 2018-08-18 ENCOUNTER — Encounter: Payer: Self-pay | Admitting: Cardiology

## 2018-08-28 ENCOUNTER — Encounter (INDEPENDENT_AMBULATORY_CARE_PROVIDER_SITE_OTHER): Payer: Medicare Other | Admitting: Ophthalmology

## 2018-08-28 DIAGNOSIS — D3131 Benign neoplasm of right choroid: Secondary | ICD-10-CM

## 2018-08-28 DIAGNOSIS — H35033 Hypertensive retinopathy, bilateral: Secondary | ICD-10-CM | POA: Diagnosis not present

## 2018-08-28 DIAGNOSIS — H43813 Vitreous degeneration, bilateral: Secondary | ICD-10-CM

## 2018-08-28 DIAGNOSIS — I10 Essential (primary) hypertension: Secondary | ICD-10-CM

## 2018-08-28 DIAGNOSIS — H353231 Exudative age-related macular degeneration, bilateral, with active choroidal neovascularization: Secondary | ICD-10-CM

## 2018-09-22 IMAGING — CR DG CHEST 2V
2 series · 2 of 2 positions shown · non-contrast
Comparison: 02/02/2015

CLINICAL DATA: Dyspnea on exertion for 1 year

EXAM:
CHEST  2 VIEW

[w chest pa]
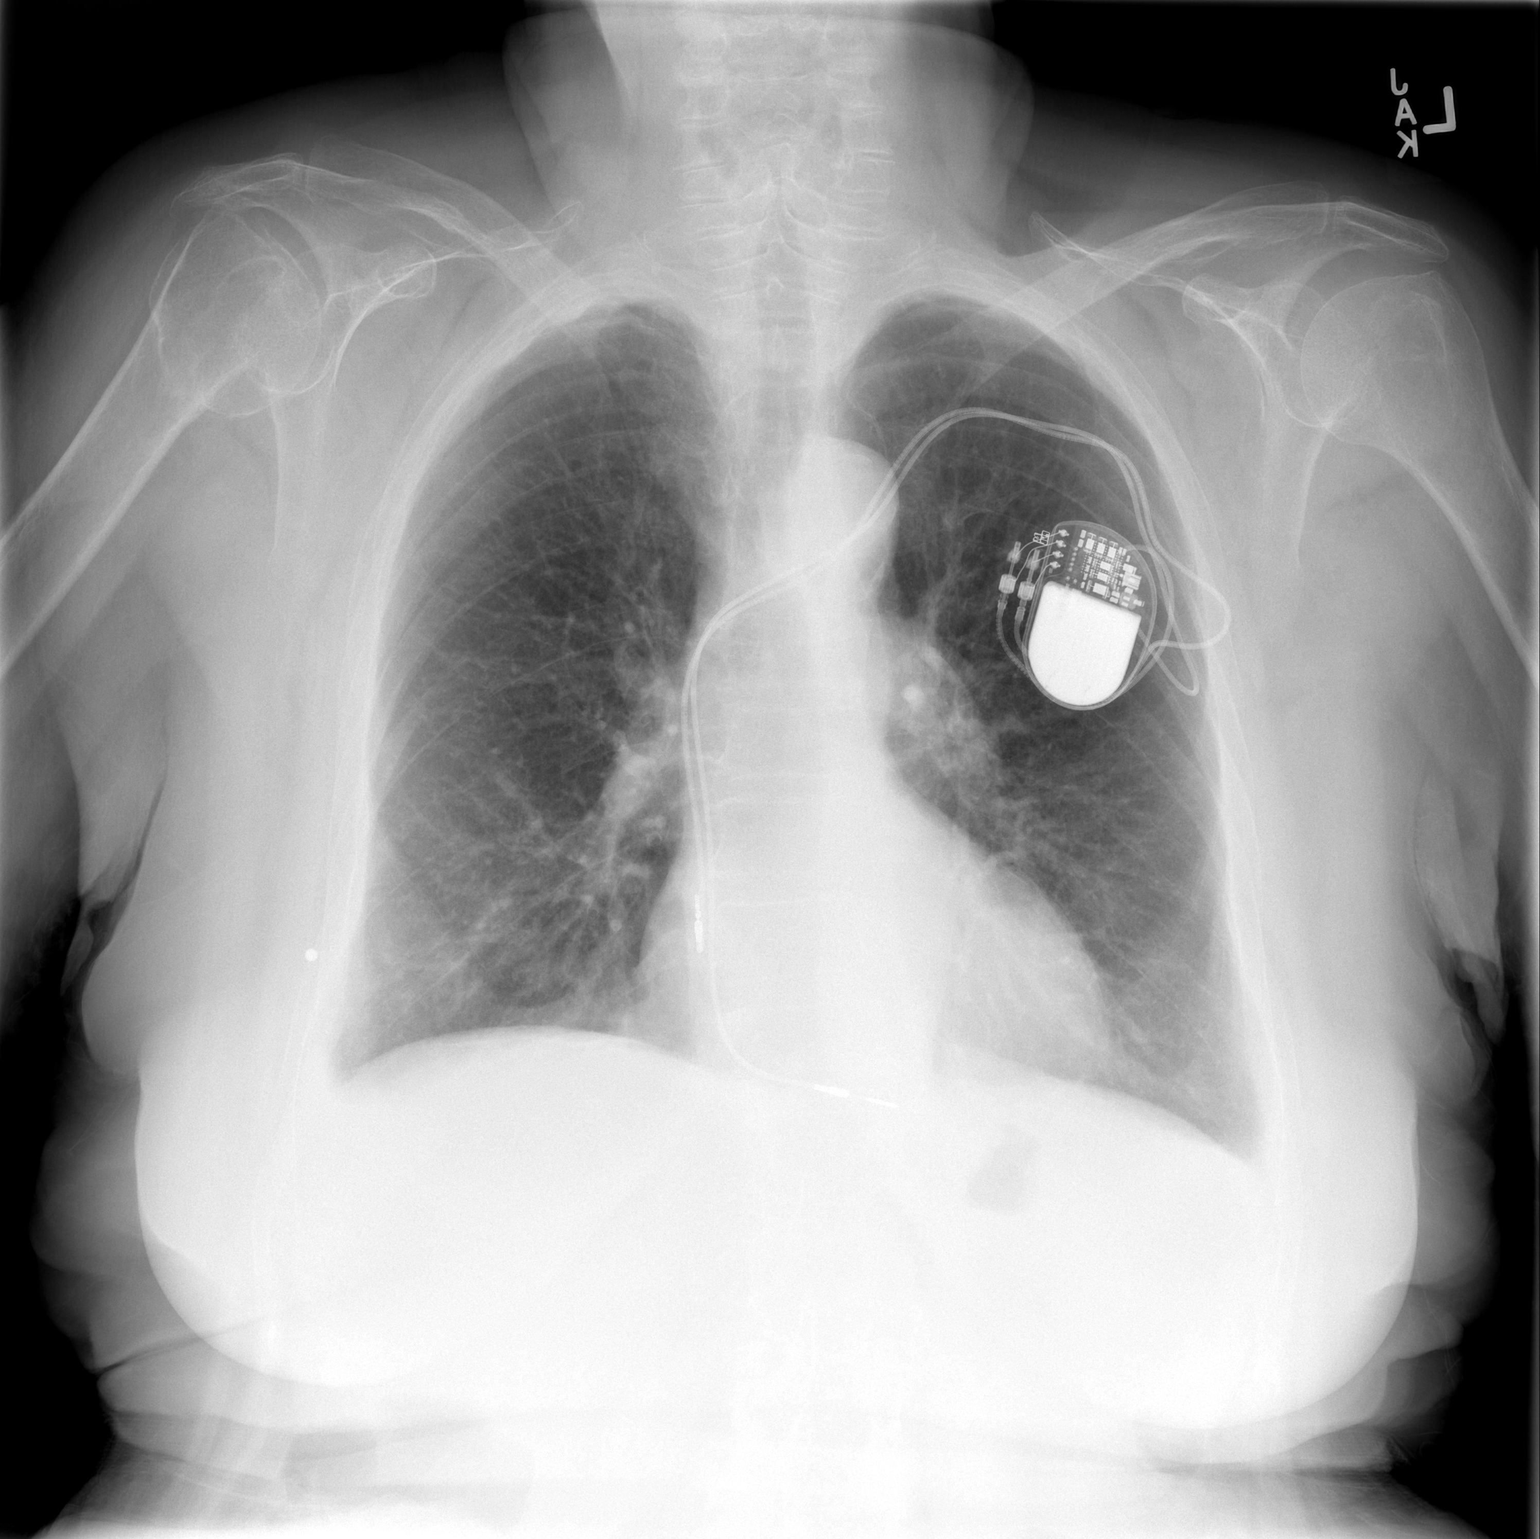

[w chest lat]
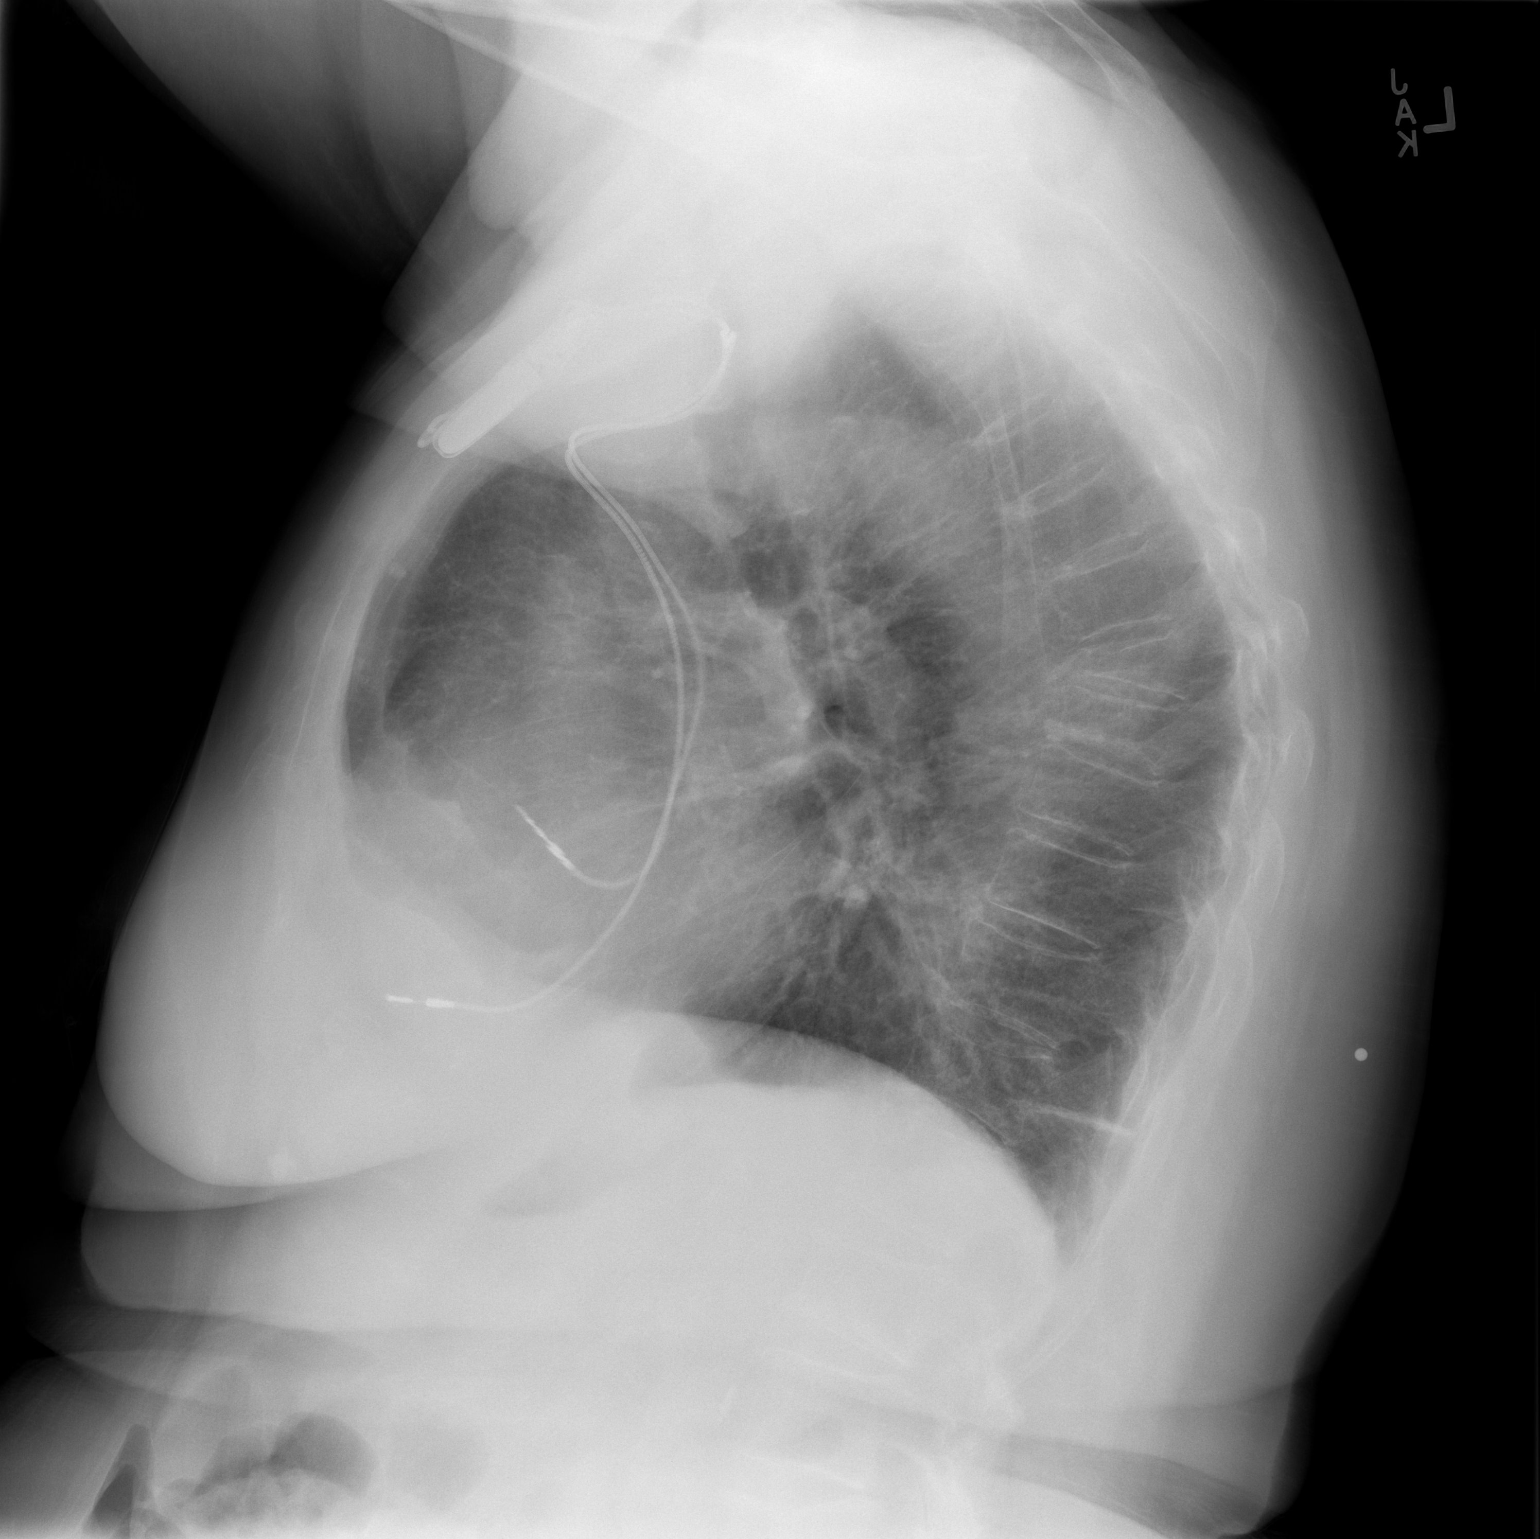

[2 of 2 positions shown; findings below may reference images not displayed]

FINDINGS: Duo lead pacemaking device with leads over the right atrium and
right ventricle. Metallic BB over the right lower chest. No acute
consolidation or effusion. Linear scarring posteriorly on the
lateral view. Stable cardiomediastinal silhouette with
atherosclerosis. No pneumothorax. Mild right pleural effusion or
thickening.
IMPRESSION: 1. Mild right pleural effusion or thickening
2. Linear atelectasis or scar posterior lung base
3. No evidence for cardiomegaly, vascular congestion or pulmonary
edema.

## 2018-09-24 LAB — CUP PACEART REMOTE DEVICE CHECK
Battery Impedance: 202 Ohm
Battery Remaining Longevity: 118 mo
Brady Statistic AP VP Percent: 14 %
Brady Statistic AP VS Percent: 0 %
Brady Statistic AS VP Percent: 86 %
Brady Statistic AS VS Percent: 0 %
Date Time Interrogation Session: 20191211135029
Implantable Lead Implant Date: 20160512
Implantable Lead Location: 753859
Implantable Lead Model: 5076
Implantable Lead Model: 5092
Implantable Pulse Generator Implant Date: 20160512
Lead Channel Impedance Value: 506 Ohm
Lead Channel Impedance Value: 674 Ohm
Lead Channel Pacing Threshold Amplitude: 0.75 V
Lead Channel Pacing Threshold Amplitude: 0.75 V
Lead Channel Pacing Threshold Pulse Width: 0.4 ms
Lead Channel Setting Pacing Amplitude: 2 V
Lead Channel Setting Pacing Amplitude: 2.5 V
Lead Channel Setting Sensing Sensitivity: 2.8 mV
MDC IDC LEAD IMPLANT DT: 20160512
MDC IDC LEAD LOCATION: 753860
MDC IDC MSMT BATTERY VOLTAGE: 2.78 V
MDC IDC MSMT LEADCHNL RV PACING THRESHOLD PULSEWIDTH: 0.4 ms
MDC IDC SET LEADCHNL RV PACING PULSEWIDTH: 0.4 ms

## 2018-09-27 ENCOUNTER — Encounter (INDEPENDENT_AMBULATORY_CARE_PROVIDER_SITE_OTHER): Payer: Medicare Other | Admitting: Ophthalmology

## 2018-09-27 DIAGNOSIS — H353231 Exudative age-related macular degeneration, bilateral, with active choroidal neovascularization: Secondary | ICD-10-CM | POA: Diagnosis not present

## 2018-09-27 DIAGNOSIS — I10 Essential (primary) hypertension: Secondary | ICD-10-CM | POA: Diagnosis not present

## 2018-09-27 DIAGNOSIS — H35033 Hypertensive retinopathy, bilateral: Secondary | ICD-10-CM

## 2018-09-27 DIAGNOSIS — H43813 Vitreous degeneration, bilateral: Secondary | ICD-10-CM

## 2018-09-28 ENCOUNTER — Encounter (INDEPENDENT_AMBULATORY_CARE_PROVIDER_SITE_OTHER): Payer: Self-pay | Admitting: Orthopedic Surgery

## 2018-09-28 ENCOUNTER — Ambulatory Visit (INDEPENDENT_AMBULATORY_CARE_PROVIDER_SITE_OTHER): Payer: Medicare Other

## 2018-09-28 ENCOUNTER — Ambulatory Visit (INDEPENDENT_AMBULATORY_CARE_PROVIDER_SITE_OTHER): Payer: Medicare Other | Admitting: Orthopedic Surgery

## 2018-09-28 VITALS — BP 131/81 | HR 75 | Resp 17 | Ht 64.0 in | Wt 184.0 lb

## 2018-09-28 DIAGNOSIS — M898X1 Other specified disorders of bone, shoulder: Secondary | ICD-10-CM

## 2018-09-28 DIAGNOSIS — R0781 Pleurodynia: Secondary | ICD-10-CM

## 2018-09-28 DIAGNOSIS — M25512 Pain in left shoulder: Secondary | ICD-10-CM

## 2018-09-28 DIAGNOSIS — X500XXA Overexertion from strenuous movement or load, initial encounter: Secondary | ICD-10-CM

## 2018-09-28 DIAGNOSIS — S29011A Strain of muscle and tendon of front wall of thorax, initial encounter: Secondary | ICD-10-CM

## 2018-09-28 DIAGNOSIS — Y92129 Unspecified place in nursing home as the place of occurrence of the external cause: Secondary | ICD-10-CM

## 2018-09-28 DIAGNOSIS — R0789 Other chest pain: Secondary | ICD-10-CM

## 2018-09-28 NOTE — Progress Notes (Signed)
Office Visit Note   Patient: [name] [name]           Date of Birth: 03/08/1936           MRN: [id number] Visit Date: 09/28/2018              Requested by: [name], MD [address] [place], [place] [address] PCP: [name], MD   Assessment & Plan: Visit Diagnoses:  1. Rib pain on left side   2. Muscle strain of chest wall, initial encounter   3. Pain of left scapula     Plan:  #1: Since she is having improvement in her symptoms we will stay with conservative treatment.  If at any point in time she worsens she will come back immediately.  Certainly if were looking for a rib fracture then bone scanning would be of benefit.  Otherwise she is not interested at this time for further testing.  Follow-Up Instructions: No follow-ups on file.   Orders:  Orders Placed This Encounter  Procedures  . XR Ribs Unilateral Left  . XR Thoracic Spine 2 View  . XR Shoulder Left   No orders of the defined types were placed in this encounter.     Procedures: No procedures performed   Clinical Data: No additional findings.   Subjective: Chief Complaint  Patient presents with  . Middle Back - Pain  . Back Pain    09/05/18 pushed on door, did not open. Most pain hurts across shoulders blades or below, pulling sensation. Most pain below left scapula, states any movement causes pain. Uses lidocaine patches that have helped. No more pain on right side about 1 week. Extra strength tylenol.     HPI  Her back is a very pleasant 83 year old white female who presents today with left hemithorax pain.  She states that in late December she was at a nursing home where her husband had been recently placed after a fracture of the hip.  She was going to leave the building when she pushed on the door and did not realize it was locked.  At that time she started having pain in the left side of her body in the hemithorax and shoulder area.  She also had pain across her shoulder  blades.  She has used lidocaine patches which apparently has been beneficial.  She does not have any more pain on the right side.  Concentrated more over the left scapular and rib area.  Some pain in the mid thorax.  She has had a kyphoplasty performed by Dr. Kathee Delton in the past.  Review of Systems  Constitutional: Negative for fever.  HENT: Negative for ear pain.   Eyes: Positive for pain and visual disturbance.       Patient had shots in eyes yesterday 09/28/18   Respiratory: Positive for shortness of breath.        Patient has had cold recently.  Cardiovascular: Negative for leg swelling.  Gastrointestinal: Negative for vomiting.  Endocrine: Negative for cold intolerance and heat intolerance.  Genitourinary: Negative for difficulty urinating.  Musculoskeletal: Positive for gait problem.  Skin: Negative for rash.  Allergic/Immunologic: Negative for food allergies.  Neurological: Negative for dizziness.  Hematological: Bruises/bleeds easily.  Psychiatric/Behavioral: Negative for sleep disturbance.     Objective: Vital Signs: BP 131/81 (BP Location: Left Arm, Patient Position: Sitting, Cuff Size: Normal)   Pulse 75   Resp 17   Ht 5\' 4"  (1.626 m)   Wt 184 lb (  83.5 kg)   BMI 31.58 kg/m   Physical Exam Constitutional:      Appearance: She is well-developed.  Eyes:     Pupils: Pupils are equal, round, and reactive to light.  Pulmonary:     Effort: Pulmonary effort is normal.  Skin:    General: Skin is warm and dry.  Neurological:     Mental Status: She is alert and oriented to person, place, and time.  Psychiatric:        Behavior: Behavior normal.     Ortho Exam  Exam today reveals marked kyphosis of the T-spine.  She does have tenderness to palpation over the left the lower rib area.  She does have pain with range of motion of her shoulder at extremes though they are equal range of motion.  She is got good strength in abduction as well as internal and external  rotation.  She does have some pain to percussion over the T-spine.  She sits comfortably at this time.  She has good grip strength bilaterally.  Specialty Comments:  No specialty comments available.  Imaging: Xr Thoracic Spine 2 View  Result Date: 09/28/2018 2 view x-ray of the thoracic spine reveals the T8 kyphoplasty methylmethacrylate.  There appears to be some compression of T7 and possibly T6.  Does not look acute.  Xr Ribs Unilateral Left  Result Date: 09/28/2018 Three-view x-ray of the ribs on the left reveals marked osteopenia of the ribs.  A marker was placed and I cannot see a fracture at this time.  Xr Shoulder Left  Result Date: 09/28/2018 Three-view x-ray of the left shoulder reveals still maintaining good joint space.  Osteopenia is noted.  Type I acromion was noted.  No occult fracture.    PMFS History: Patient Active Problem List   Diagnosis Date Noted  . Fall 08/12/2015  . Tibial plateau fracture 08/11/2015  . Cardiac device in situ   . Second degree Mobitz II AV block   . Hypertension    Past Medical History:  Diagnosis Date  . Anxiety   . Broken arm Right  . Broken leg   . Complete heart block ([place])   . Hypertension   . Postural dizziness     Family History  Problem Relation Age of Onset  . Heart failure Mother   . Hypertension Mother   . Hypertension Father   . Atrial fibrillation Father     Past Surgical History:  Procedure Laterality Date  . EP IMPLANTABLE DEVICE N/A 01/16/2015   MDT Adapta L PPM implanted by Dr Rayann Heman for complete heart block  . IR GENERIC HISTORICAL  09/15/2016   IR RADIOLOGIST EVAL & MGMT 09/15/2016 [facility]  . IR GENERIC HISTORICAL  09/28/2016   IR KYPHO THORACIC WITH BONE BIOPSY 09/28/2016 Luanne Bras, MD [facility]  . IR GENERIC HISTORICAL  10/14/2016   IR RADIOLOGIST EVAL & MGMT 10/14/2016 [facility]  . PACEMAKER INSERTION N/A 01/2015   Social History   Occupational History  . Not on file  Tobacco Use    . Smoking status: Former Smoker    Years: 30.00    Types: Cigarettes    Last attempt to quit: 09/06/1986    Years since quitting: 32.0  . Smokeless tobacco: Never Used  Substance and Sexual Activity  . Alcohol use: Yes    Alcohol/week: 0.0 standard drinks    Comment: occasionally 1 monthly  . Drug use: No  . Sexual activity: Not on file

## 2018-09-29 IMAGING — CT CT T SPINE W/O CM
3 series · 12 of 33 positions shown, 14 images · non-contrast
Comparison: Chest two-view 08/25/2016, CT chest 01/14/2015

CLINICAL DATA: Closed compression fracture thoracic vertebra,
initial encounter. Evaluate T8 fracture.

EXAM:
CT THORACIC SPINE WITHOUT CONTRAST
TECHNIQUE: Multidetector CT images of the thoracic were obtained using the
standard protocol without intravenous contrast.

[Series 3: t spine soft · axial · 0.40mm/px · z∈[-659,-443]mm · 4 of 106 slices shown, 5 images]
[im 17/106  soft-tissue]
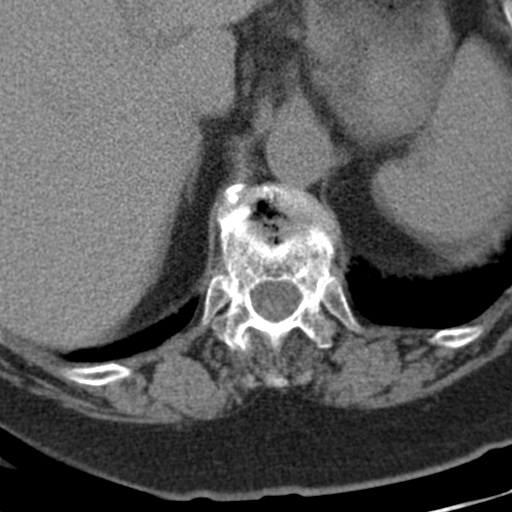
[im 17/106  bone]
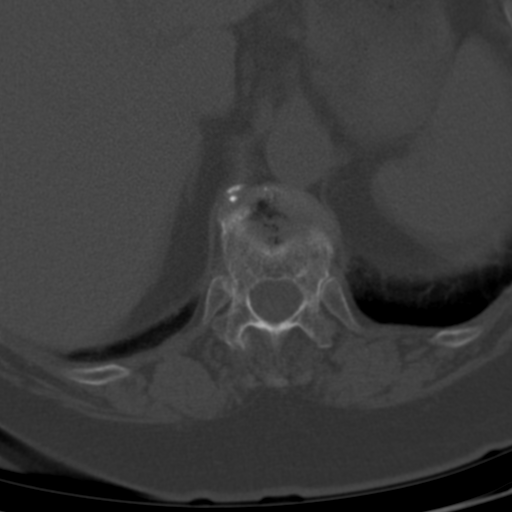
[im 41/106  bone]
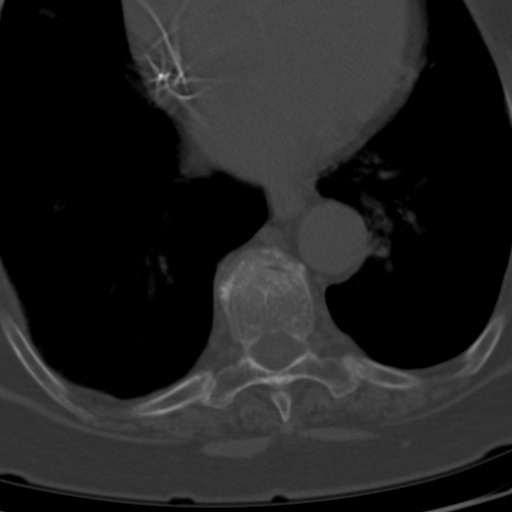
[im 65/106  bone]
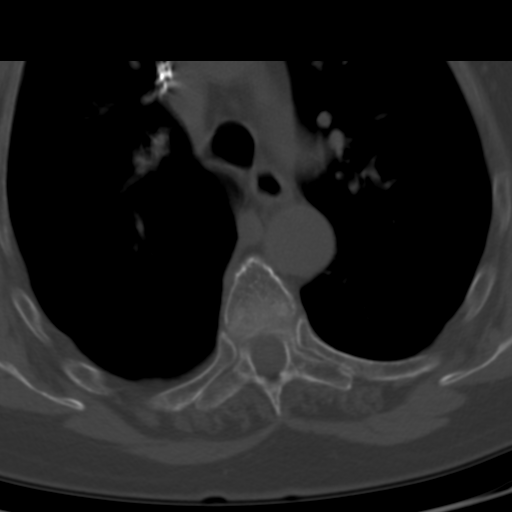
[im 89/106  bone]
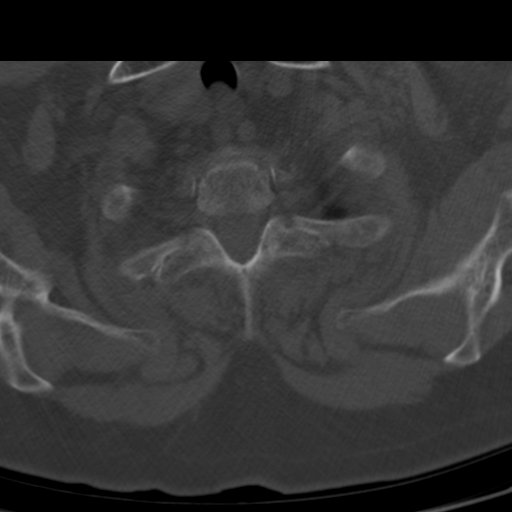

[Series 7: cor · coronal · 0.38mm/px · 3 of 56 slices shown]
[im 12/56  bone]
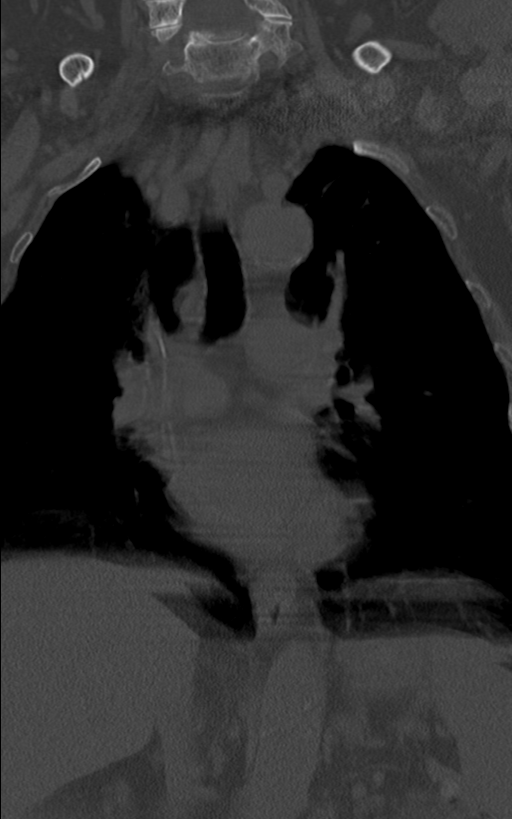
[im 23/56  bone]
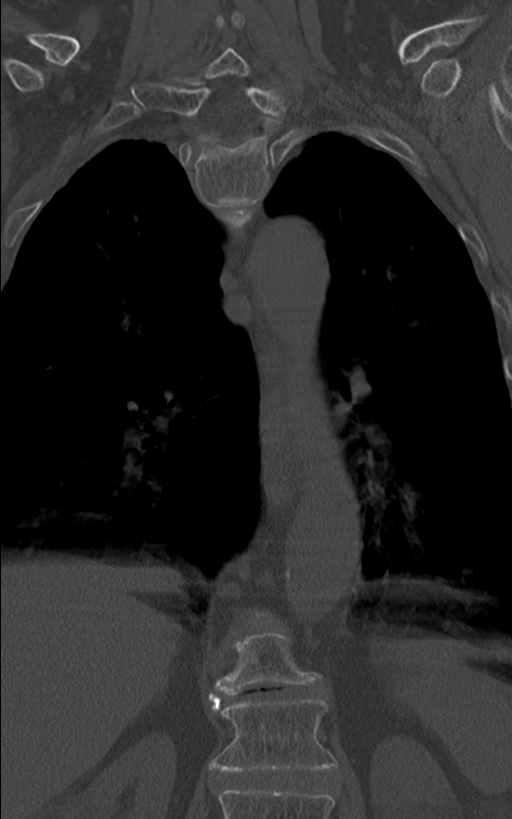
[im 34/56  bone]
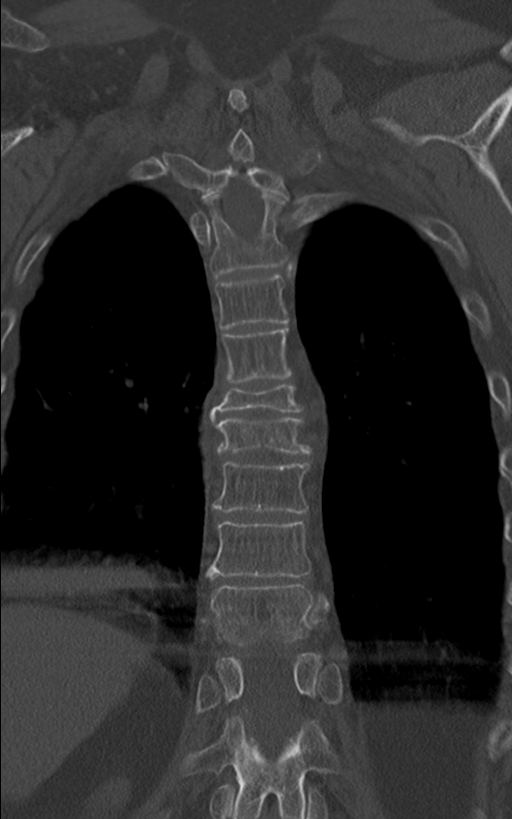

[Series 8: sag · sagittal · 0.38mm/px · 5 of 63 slices shown, 6 images]
[im 21/63  bone]
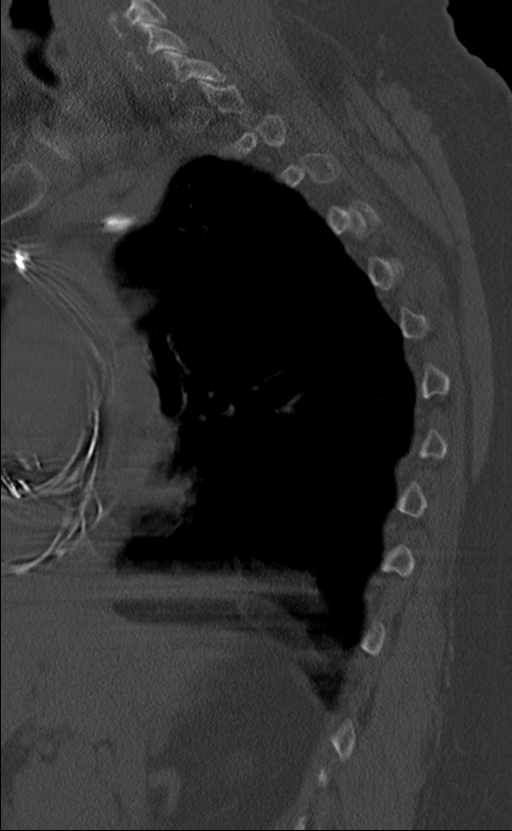
[im 26/63  bone]
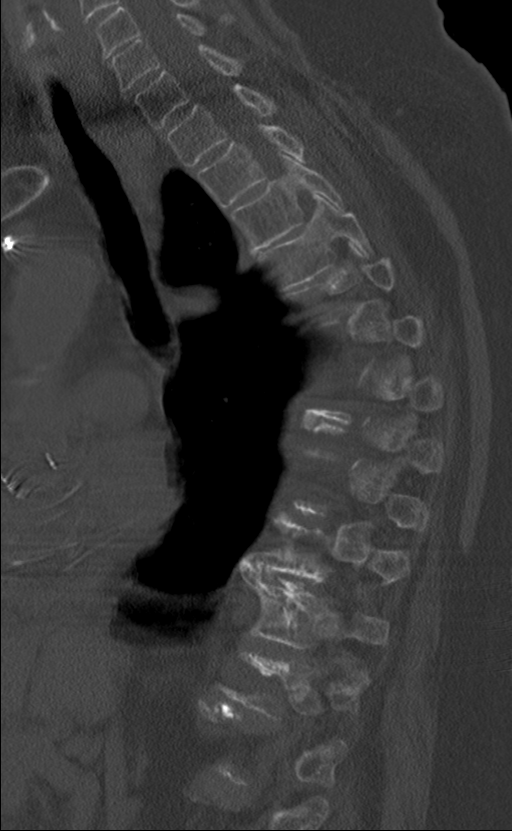
[im 32/63  soft-tissue]
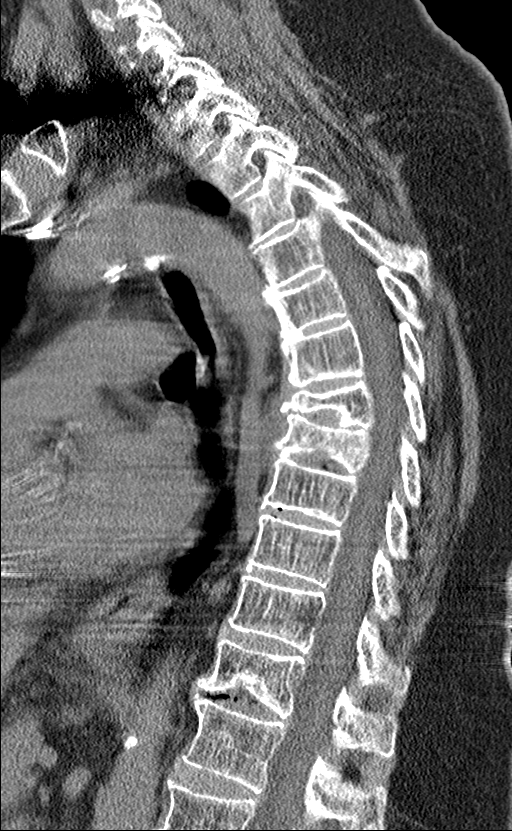
[im 32/63  bone]
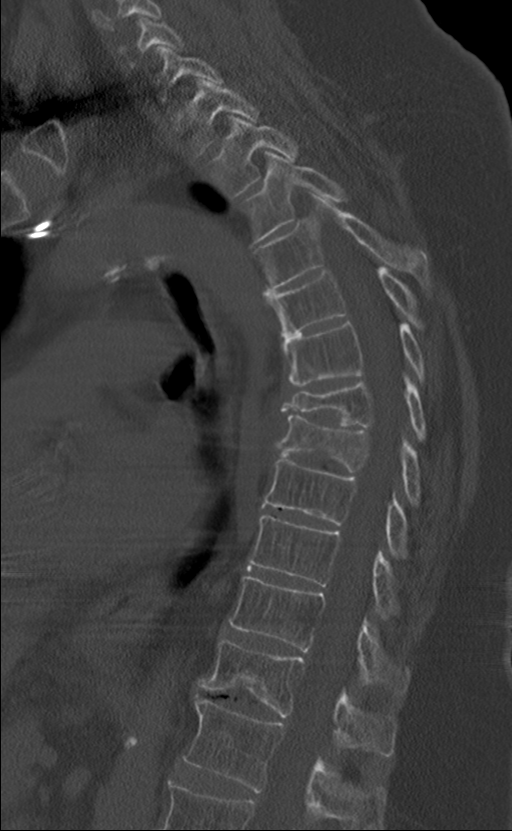
[im 37/63  bone]
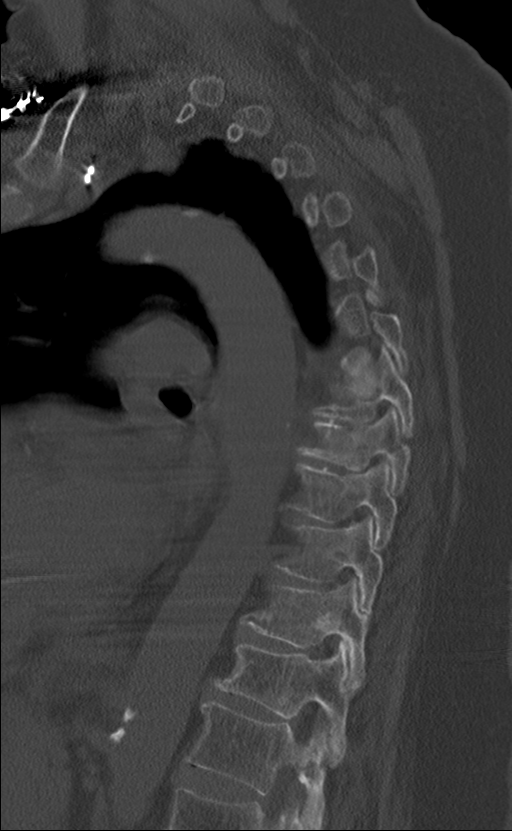
[im 42/63  bone]
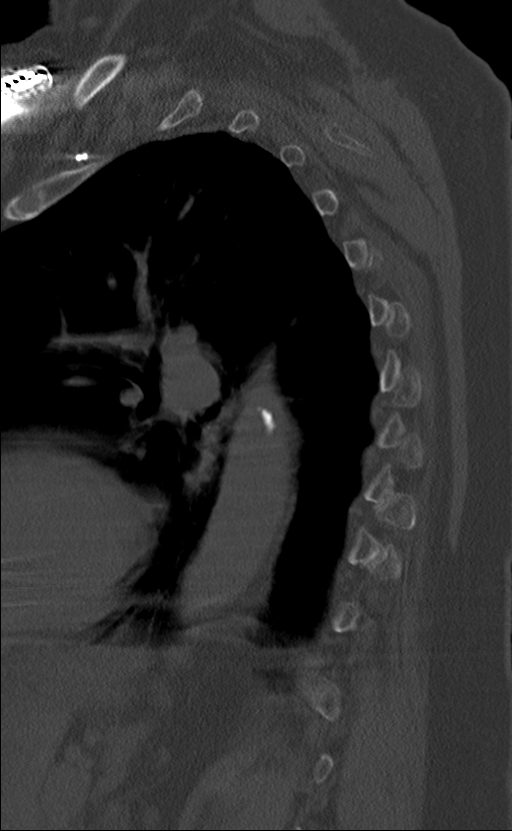

[12 of 33 positions shown; findings below may reference images not displayed]

FINDINGS: Alignment: Normal alignment. 55 degrees of scoliosis at the T7
level. This has progressed since the prior CT.

Vertebrae: Moderately severe chronic compression fracture T7
unchanged from the prior CT

Mild to moderate compression fracture T8 was not present previously.
There is paraspinous soft tissue swelling due to hemorrhage from
recent fracture of T8.

Mild compression fracture of T12. This was not present on the prior
CT. This does not appear to be acute however.

Paraspinal and other soft tissues: Paraspinous soft tissue swelling
around the fracture at T8. No mass or fluid collection.

Numerous calcified gallstones. Pacemaker. Visualized lungs are
clear. Atherosclerotic disease in the aorta.

Disc levels: New negative for thoracic disc protrusion. No spinal
stenosis in the thoracic spine. Slight retropulsion of T8 fracture
into the canal without significant stenosis.
IMPRESSION: Chronic compression fracture T7.

Recent fracture of T8 with slight retropulsion of bone into the
canal not causing significant spinal stenosis.

Mild compression fracture inferior endplate of T12 which is probably
chronic

Cholelithiasis

## 2018-10-02 ENCOUNTER — Encounter (INDEPENDENT_AMBULATORY_CARE_PROVIDER_SITE_OTHER): Payer: Medicare Other | Admitting: Ophthalmology

## 2018-10-02 DIAGNOSIS — H43813 Vitreous degeneration, bilateral: Secondary | ICD-10-CM

## 2018-10-02 DIAGNOSIS — H353231 Exudative age-related macular degeneration, bilateral, with active choroidal neovascularization: Secondary | ICD-10-CM | POA: Diagnosis not present

## 2018-10-02 DIAGNOSIS — H10531 Contact blepharoconjunctivitis, right eye: Secondary | ICD-10-CM

## 2018-10-02 DIAGNOSIS — H35033 Hypertensive retinopathy, bilateral: Secondary | ICD-10-CM | POA: Diagnosis not present

## 2018-10-02 DIAGNOSIS — I10 Essential (primary) hypertension: Secondary | ICD-10-CM | POA: Diagnosis not present

## 2018-10-06 ENCOUNTER — Ambulatory Visit (INDEPENDENT_AMBULATORY_CARE_PROVIDER_SITE_OTHER): Payer: Medicare Other | Admitting: Orthopaedic Surgery

## 2018-10-25 ENCOUNTER — Encounter (INDEPENDENT_AMBULATORY_CARE_PROVIDER_SITE_OTHER): Payer: Medicare Other | Admitting: Ophthalmology

## 2018-10-25 DIAGNOSIS — H353231 Exudative age-related macular degeneration, bilateral, with active choroidal neovascularization: Secondary | ICD-10-CM

## 2018-10-25 DIAGNOSIS — H43813 Vitreous degeneration, bilateral: Secondary | ICD-10-CM | POA: Diagnosis not present

## 2018-10-25 DIAGNOSIS — D3131 Benign neoplasm of right choroid: Secondary | ICD-10-CM | POA: Diagnosis not present

## 2018-10-25 DIAGNOSIS — I10 Essential (primary) hypertension: Secondary | ICD-10-CM

## 2018-10-25 DIAGNOSIS — H35033 Hypertensive retinopathy, bilateral: Secondary | ICD-10-CM

## 2018-10-26 IMAGING — XA IR KYPHO VERTEBRAL THORACIC AUGMENTATION
1 series · 13 of 24 positions shown · non-contrast
Comparison: none

INDICATION: Severe lower thoracic pain secondary to compression fracture at T8.

[Series 300: dr. (person_name) · 13 of 27 slices shown]
[im 1/27]
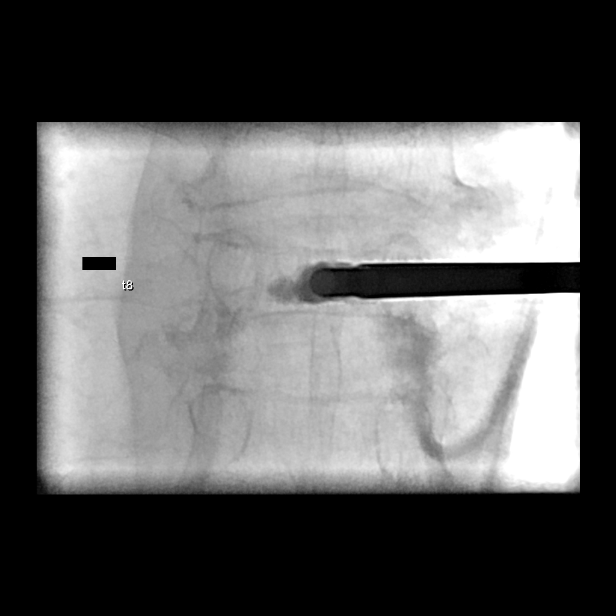
[im 3/27]
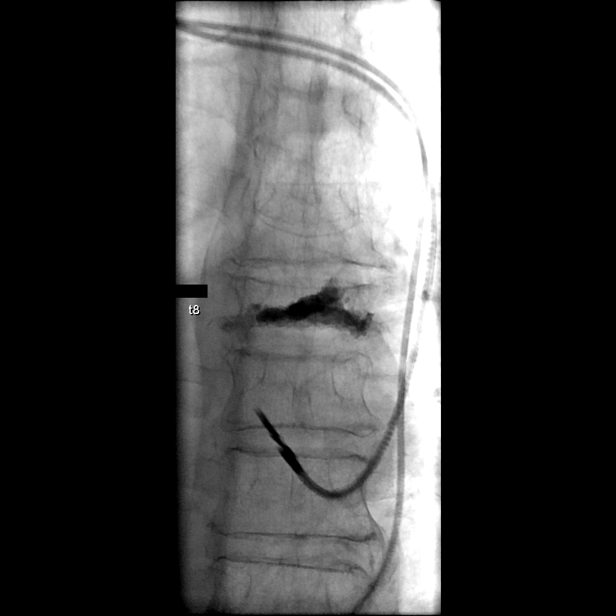
[im 5/27]
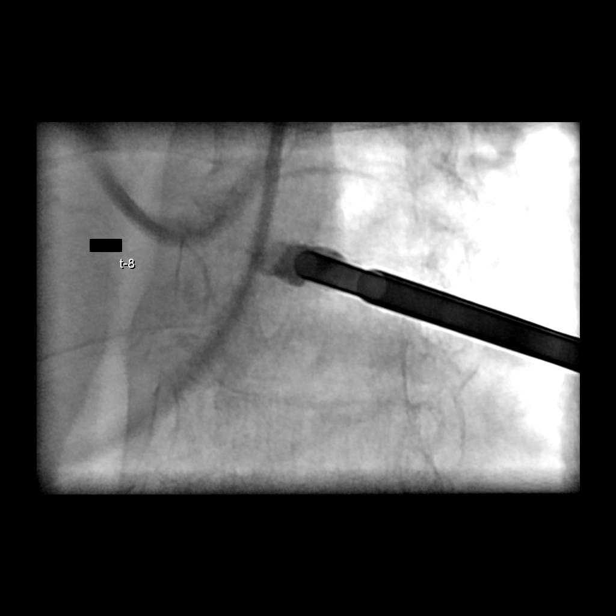
[im 7/27]
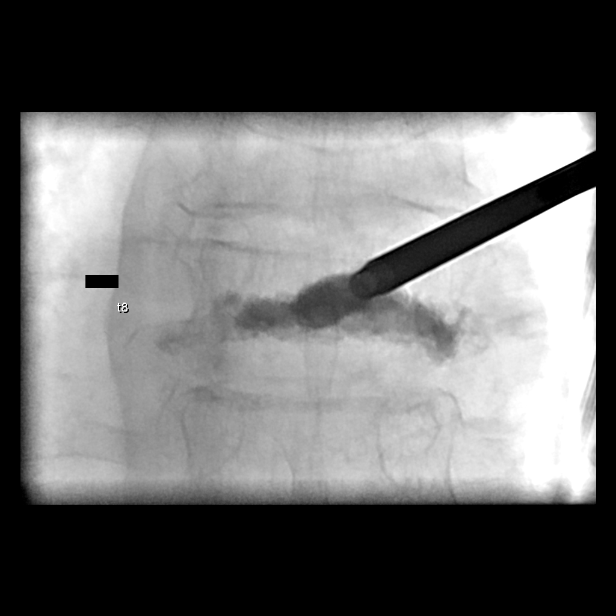
[im 10/27]
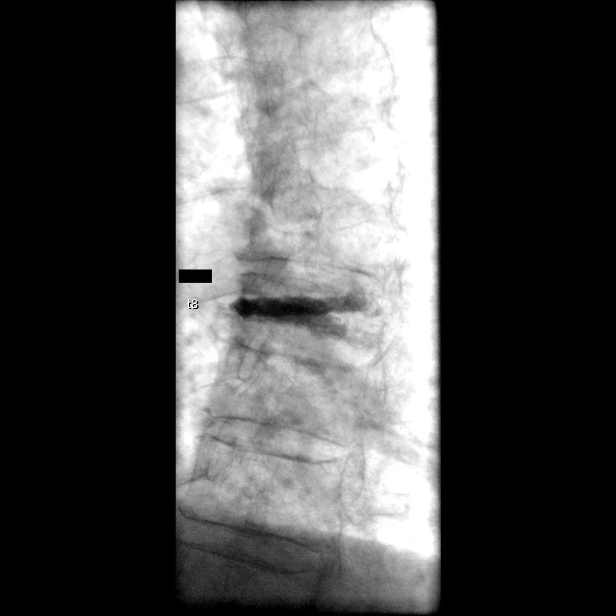
[im 12/27]
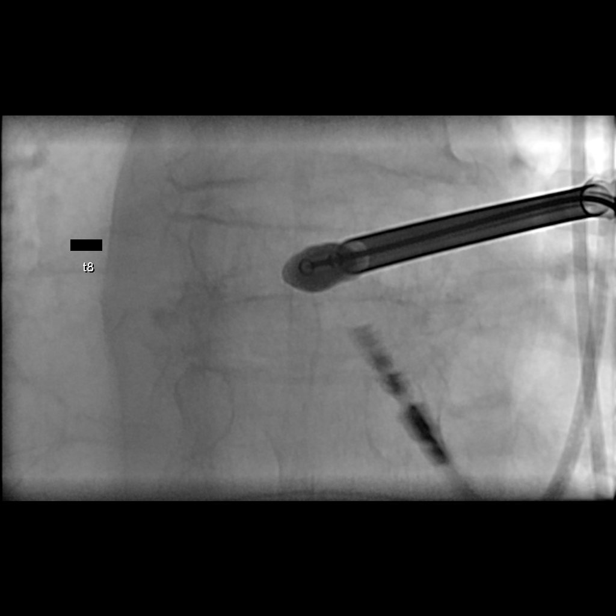
[im 14/27]
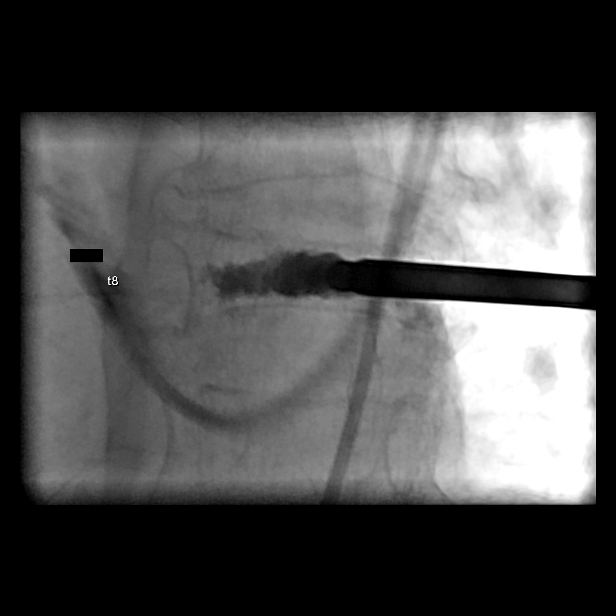
[im 15/27]
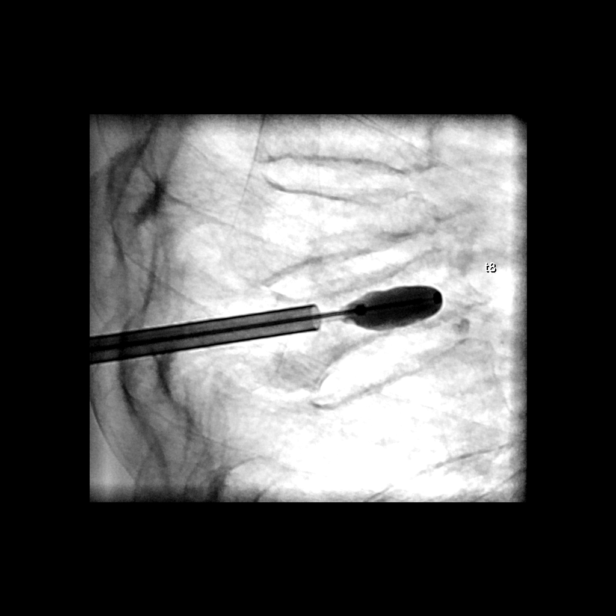
[im 17/27]
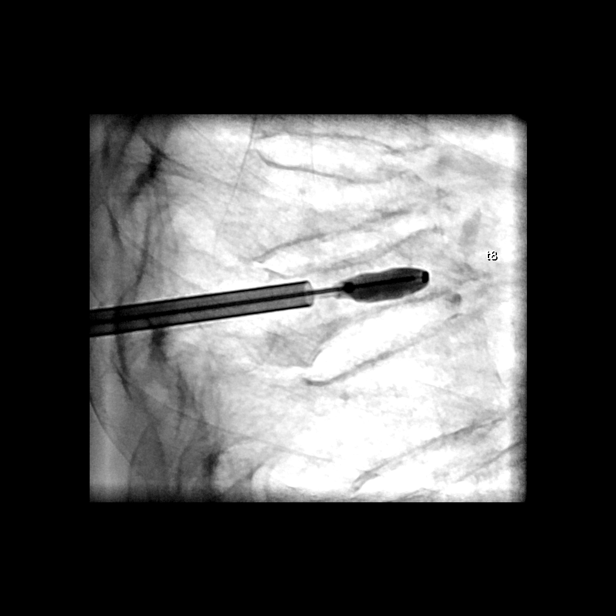
[im 20/27]
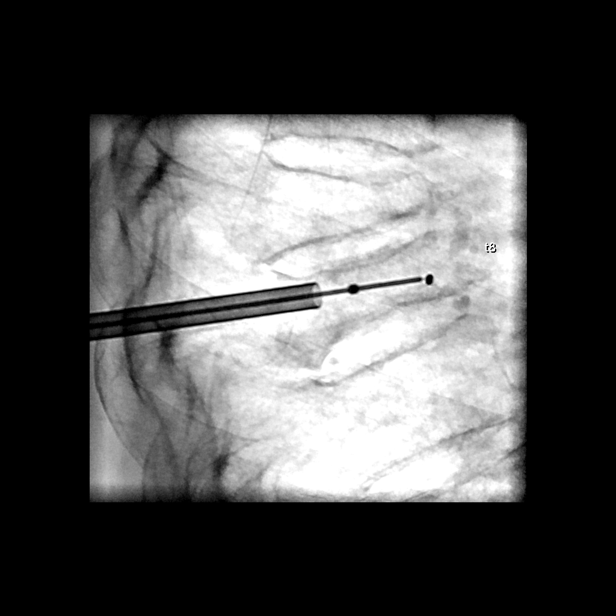
[im 22/27]
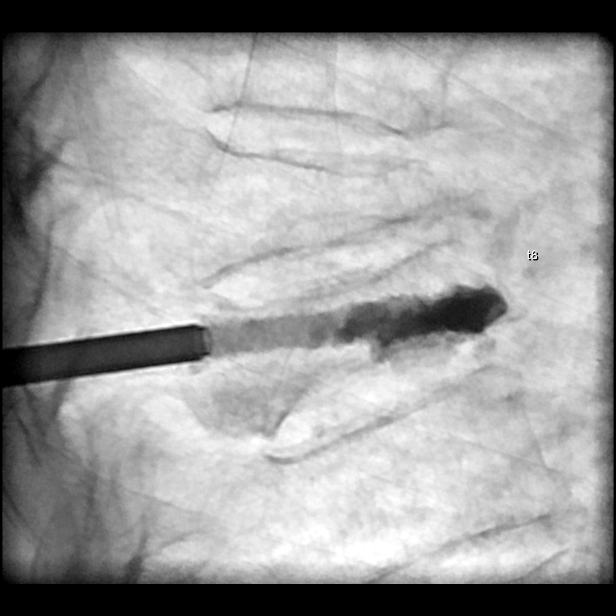
[im 24/27]
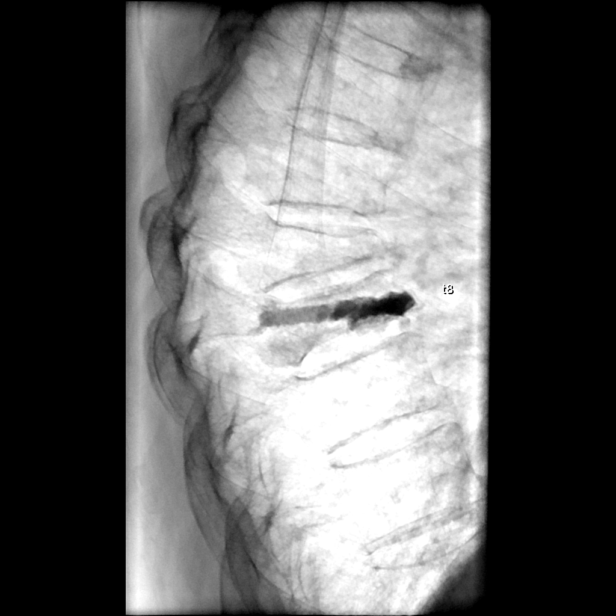
[im 27/27]
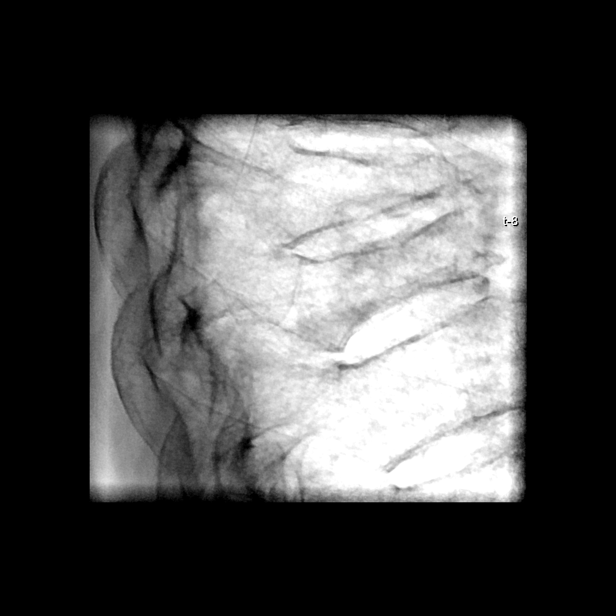

[13 of 24 positions shown; findings below may reference images not displayed]

EXAM:
IR KYPHO VERTEBRAL THORACIC AUGMENTATION

MEDICATIONS:
As antibiotic prophylaxis, vancomycin 1 g IV was ordered
pre-procedure and administered intravenously within 1 hour of
incision.

ANESTHESIA/SEDATION:
Moderate (conscious) sedation was employed during this procedure. A
total of Versed mg and Fentanyl mcg was administered intravenously.

Moderate Sedation Time: 21 minutes minutes. The patient's level of
consciousness and vital signs were monitored continuously by
radiology nursing throughout the procedure under my direct
supervision.

FLUOROSCOPY TIME:  Fluoroscopy Time: 8 minutes 30 seconds (776 mGy)

COMPLICATIONS:
None immediate.

PROCEDURE:
Following a full explanation of the procedure along with the
potential associated complications, an informed witnessed consent
was obtained.

The patient was placed prone on the fluoroscopic table. The skin
overlying the thoracic region was then prepped and draped in the
usual sterile fashion. The right pedicle at T8 was then infiltrated
with 0.25% bupivacaine followed by the advancement of an 11-gauge
Jamshidi needle through the right pedicle into the posterior
one-third at T8. This was then exchanged for a Kyphon advanced osteo
introducer system comprised of a working cannula and a Kyphon osteo
drill.

This combination was then advanced over a Kyphon osteo bone pin
until the tip of the Kyphon osteo drill was in the posterior
one-third at T8.

At this time, the bone pin was removed. In a medial trajectory, the
combination was advanced until the tip of the working cannula was
inside the posterior one-third at T8.

Through the working cannula, a Kyphon inflatable bone tamp 20 x 3
was advanced and positioned with the distal marker 5 mm from the
anterior aspect of T8. Crossing of the midline was seen on the AP
projection. At this time, the balloon was expanded using contrast
via a Kyphon inflation syringe device via microtubing.

Inflations were continued until there was apposition with the
superior and the inferior endplates.

At this time, methylmethacrylate mixture was reconstituted with
Tobramycin in the Kyphon bone mixing device system. This was then
loaded onto the Kyphon bone fillers.

The balloon was deflated and removed followed by the instillation of
1-1/2 bone filler equivalents of methylmethacrylate mixture at T8
with excellent filling in the AP and lateral projections. No
extravasation was noted in the disk spaces or posteriorly into the
spinal canal. No epidural venous contamination was seen.

The working cannula and the bone filler were then retrieved and
removed. Hemostasis was achieved at the skin entry site.
IMPRESSION: 1. Status post vertebral body augmentation using balloon kyphoplasty
at T8 as described without event.

## 2018-11-04 ENCOUNTER — Other Ambulatory Visit: Payer: Self-pay | Admitting: Physician Assistant

## 2018-11-12 NOTE — Progress Notes (Signed)
Cardiology Office Note   Date:  11/13/2018   ID:  [name] [name], DOB 1935-12-01, MRN [id number]  PCP:  [name], MD  Cardiologist:   [name], MD   Chief Complaint  Patient presents with  . Hypertension    History of Present Illness: [name] [name] is a 83 y.o. female who presents for evaluation of difficult to control hypertension.  She had heart block and did have a pacemaker placed.   Since I last saw her she has had a lot of stress.  Her husband broke his leg and is now in rehab.  She is been checking her blood pressure and is been creeping up.  She has systolics in the 893Y and diastolics in the 10F.  However, she still has some low readings.  She is had a back pain that has contributed to some of her stress.  Is been anxious about all of this.  She gets dizzy if her blood pressure goes too high and wobbly if it is too low.  She denies any presyncope or syncope.  She has had no chest pressure, neck or arm discomfort.  She said no weight gain or edema.  She is not been walking or doing as much exercise as she used to.    Past Medical History:  Diagnosis Date  . Anxiety   . Broken arm Right  . Broken leg   . Complete heart block ([place])   . Hypertension   . Postural dizziness     Past Surgical History:  Procedure Laterality Date  . EP IMPLANTABLE DEVICE N/A 01/16/2015   MDT Adapta L PPM implanted by Dr Rayann Heman for complete heart block  . IR GENERIC HISTORICAL  09/15/2016   IR RADIOLOGIST EVAL & MGMT 09/15/2016 [facility]  . IR GENERIC HISTORICAL  09/28/2016   IR KYPHO THORACIC WITH BONE BIOPSY 09/28/2016 Luanne Bras, MD [facility]  . IR GENERIC HISTORICAL  10/14/2016   IR RADIOLOGIST EVAL & MGMT 10/14/2016 [facility]  . PACEMAKER INSERTION N/A 01/2015     Current Outpatient Medications  Medication Sig Dispense Refill  . acetaminophen (TYLENOL) 500 MG tablet Take 500 mg by mouth every 6  (six) hours as needed for mild pain.    Marland Kitchen ALPRAZolam (XANAX) 0.25 MG tablet Take 0.125 tablets by mouth daily as needed for anxiety.     Marland Kitchen amLODipine (NORVASC) 5 MG tablet TAKE 1 TABLET ONCE DAILY AND MAY TAKE ADDITIONAL HALF TABLET DAILY IFNEEDED. 90 tablet 0  . aspirin 325 MG tablet Take 650 mg by mouth at bedtime as needed for moderate pain. Will take at bedtime if needed for pain    . aspirin 81 MG tablet Take 81 mg by mouth daily.    Marland Kitchen Besifloxacin HCl (BESIVANCE) 0.6 % SUSP Apply 1 drop to eye 4 (four) times daily. For 2 days after injection    . Calcium 600-400 MG-UNIT CHEW Chew 1 tablet by mouth daily.    . calcium carbonate (TUMS EX) 750 MG chewable tablet Chew 1 tablet by mouth 2 (two) times daily as needed.     . Cholecalciferol (VITAMIN D3) 2000 units TABS Take 1 tablet by mouth daily.    . famotidine (PEPCID) 20 MG tablet Take 20 mg by mouth daily.    . fluticasone (FLONASE) 50 MCG/ACT nasal spray Place 2 sprays into both nostrils daily as needed for allergies.     Marland Kitchen lidocaine (LIDODERM) 5 % Place 1 patch onto the skin daily as needed.  Remove & Discard patch within 12 hours or as directed by MD    . Magnesium Hydroxide (PHILLIPS MILK OF MAGNESIA PO) Take 1-2 tablets by mouth 2 (two) times daily as needed.    . Multiple Vitamins-Minerals (ICAPS MV PO) Take 1 tablet by mouth daily.    Marland Kitchen PRESCRIPTION MEDICATION Place into both eyes every 30 (thirty) days. Avastin injection in left eye every 4 weeks by Dr Hilma Favors Next injection due 10-06-2016      No current facility-administered medications for this visit.     Allergies:   Hctz [hydrochlorothiazide]; Losartan; Macrolides and ketolides; Benazepril; and Penicillins    ROS:  Please see the history of present illness.   Otherwise, review of systems are positive for recent upper respiratory and cold-like symptoms..   All other systems are reviewed and negative.    PHYSICAL EXAM: VS:  BP (!) 142/72   Ht 5\' 4"  (1.626 m)   Wt 184 lb  9.6 oz (83.7 kg)   BMI 31.69 kg/m  , BMI Body mass index is 31.69 kg/m.  GENERAL:  Well appearing NECK:  No jugular venous distention, waveform within normal limits, carotid upstroke brisk and symmetric, no bruits, no thyromegaly LUNGS:  Clear to auscultation bilaterally CHEST:  Well healed pacemaker scar.    HEART:  PMI not displaced or sustained,S1 and S2 within normal limits, no S3, no S4, no clicks, no rubs, 2 out of 6 apical systolic murmur radiating slightly at the aortic outflow tract and into the left carotid, no diastolic murmurs ABD:  Flat, positive bowel sounds normal in frequency in pitch, no bruits, no rebound, no guarding, no midline pulsatile mass, no hepatomegaly, no splenomegaly EXT:  2 plus pulses throughout, no edema, no cyanosis no clubbing   EKG:  EKG is  ordered today. Sinus rhythm, first-degree AV block, ventricular pacing 100% capture rate 80.     Recent Labs: 11/17/2017: TSH 0.941 11/28/2017: BUN 14; Creatinine, Ser 0.91; Potassium 4.8; Sodium 142      Wt Readings from Last 3 Encounters:  11/13/18 184 lb 9.6 oz (83.7 kg)  09/28/18 184 lb (83.5 kg)  05/22/18 189 lb 9.6 oz (86 kg)      Other studies Reviewed: Additional studies/ records that were reviewed today include: BP diary Review of the above records demonstrates:      ASSESSMENT AND PLAN:  Complete heart block/Status post pacemaker She is up-to-date with pacemaker follow up.  I reviewed Dr. Jackalyn Lombard most recent note.    HTN Her blood pressure is slightly elevated.  However, she is under a lot of anxiety.  We talked about treating the anxiety.  She will keep a blood pressure diary but take her blood pressures less frequently.  Because of the lows I am not going to increase her Norvasc unless she has consistently elevated readings.  She feels reassured by all of this.  We talked about weight loss and walking to help.   DIZZINESS This seems to be only mild and intermittent and associated with her  blood pressures.  No change in therapy.  MURMUR She has a slight systolic murmur and she had some aortic sclerosis without stenosis previously.  I would not suspect any significant change hemodynamically and so no further imaging is indicated.  Current medicines are reviewed at length with the patient today.  The patient does not have concerns regarding medicines.  The following changes have been made:  None  Labs/ tests ordered today include:  None  Orders Placed  This Encounter  Procedures  . EKG 12-Lead     Disposition:   FU with me in 6 months.     Signed, [name], MD  11/13/2018 11:16 AM    [place]

## 2018-11-13 ENCOUNTER — Encounter: Payer: Self-pay | Admitting: Cardiology

## 2018-11-13 ENCOUNTER — Ambulatory Visit: Payer: Medicare Other | Admitting: Cardiology

## 2018-11-13 VITALS — BP 142/72 | Ht 64.0 in | Wt 184.6 lb

## 2018-11-13 DIAGNOSIS — I10 Essential (primary) hypertension: Secondary | ICD-10-CM | POA: Diagnosis not present

## 2018-11-13 NOTE — Patient Instructions (Signed)
Medication Instructions:  Continue current medications  If you need a refill on your cardiac medications before your next appointment, please call your pharmacy.  Labwork: None Ordered   Testing/Procedures: None Ordered   Follow-Up: You will need a follow up appointment in 6 months.  Please call our office 2 months in advance to schedule this appointment.  You may see Dr Hochrein or one of the following Advanced Practice Providers on your designated Care Team:   Rhonda Barrett, PA-C . Kathryn Lawrence, DNP, ANP    At [facility], you and your health needs are our priority.  As part of our continuing mission to provide you with exceptional heart care, we have created designated Provider Care Teams.  These Care Teams include your primary Cardiologist (physician) and Advanced Practice Providers (APPs -  Physician Assistants and Nurse Practitioners) who all work together to provide you with the care you need, when you need it.  Thank you for choosing [facility] at [facility]!!     

## 2018-11-15 ENCOUNTER — Ambulatory Visit (INDEPENDENT_AMBULATORY_CARE_PROVIDER_SITE_OTHER): Payer: Medicare Other | Admitting: *Deleted

## 2018-11-15 DIAGNOSIS — I441 Atrioventricular block, second degree: Secondary | ICD-10-CM | POA: Diagnosis not present

## 2018-11-16 LAB — CUP PACEART REMOTE DEVICE CHECK
Battery Impedance: 226 Ohm
Battery Voltage: 2.78 V
Brady Statistic AP VP Percent: 13 %
Brady Statistic AP VS Percent: 0 %
Brady Statistic AS VP Percent: 87 %
Brady Statistic AS VS Percent: 0 %
Date Time Interrogation Session: 20200312005919
Implantable Lead Implant Date: 20160512
Implantable Lead Location: 753859
Implantable Lead Location: 753860
Implantable Lead Model: 5076
Implantable Lead Model: 5092
Implantable Pulse Generator Implant Date: 20160512
Lead Channel Impedance Value: 490 Ohm
Lead Channel Impedance Value: 674 Ohm
Lead Channel Pacing Threshold Amplitude: 0.625 V
Lead Channel Pacing Threshold Amplitude: 0.625 V
Lead Channel Pacing Threshold Pulse Width: 0.4 ms
Lead Channel Pacing Threshold Pulse Width: 0.4 ms
Lead Channel Setting Sensing Sensitivity: 2.8 mV
MDC IDC LEAD IMPLANT DT: 20160512
MDC IDC MSMT BATTERY REMAINING LONGEVITY: 115 mo
MDC IDC SET LEADCHNL RA PACING AMPLITUDE: 2 V
MDC IDC SET LEADCHNL RV PACING AMPLITUDE: 2.5 V
MDC IDC SET LEADCHNL RV PACING PULSEWIDTH: 0.4 ms

## 2018-11-22 ENCOUNTER — Other Ambulatory Visit: Payer: Self-pay

## 2018-11-22 ENCOUNTER — Encounter (INDEPENDENT_AMBULATORY_CARE_PROVIDER_SITE_OTHER): Payer: Medicare Other | Admitting: Ophthalmology

## 2018-11-22 DIAGNOSIS — I10 Essential (primary) hypertension: Secondary | ICD-10-CM

## 2018-11-22 DIAGNOSIS — H35033 Hypertensive retinopathy, bilateral: Secondary | ICD-10-CM

## 2018-11-22 DIAGNOSIS — H43813 Vitreous degeneration, bilateral: Secondary | ICD-10-CM | POA: Diagnosis not present

## 2018-11-22 DIAGNOSIS — H353231 Exudative age-related macular degeneration, bilateral, with active choroidal neovascularization: Secondary | ICD-10-CM | POA: Diagnosis not present

## 2018-11-22 DIAGNOSIS — D3131 Benign neoplasm of right choroid: Secondary | ICD-10-CM

## 2018-11-22 NOTE — Progress Notes (Signed)
Remote pacemaker transmission.   

## 2018-12-21 ENCOUNTER — Encounter (INDEPENDENT_AMBULATORY_CARE_PROVIDER_SITE_OTHER): Payer: Medicare Other | Admitting: Ophthalmology

## 2018-12-21 ENCOUNTER — Other Ambulatory Visit: Payer: Self-pay

## 2018-12-21 DIAGNOSIS — H43813 Vitreous degeneration, bilateral: Secondary | ICD-10-CM

## 2018-12-21 DIAGNOSIS — H353231 Exudative age-related macular degeneration, bilateral, with active choroidal neovascularization: Secondary | ICD-10-CM | POA: Diagnosis not present

## 2018-12-21 DIAGNOSIS — I10 Essential (primary) hypertension: Secondary | ICD-10-CM

## 2018-12-21 DIAGNOSIS — H35033 Hypertensive retinopathy, bilateral: Secondary | ICD-10-CM | POA: Diagnosis not present

## 2018-12-21 DIAGNOSIS — D3131 Benign neoplasm of right choroid: Secondary | ICD-10-CM

## 2019-01-18 ENCOUNTER — Encounter (INDEPENDENT_AMBULATORY_CARE_PROVIDER_SITE_OTHER): Payer: Medicare Other | Admitting: Ophthalmology

## 2019-01-18 ENCOUNTER — Other Ambulatory Visit: Payer: Self-pay

## 2019-01-18 DIAGNOSIS — I10 Essential (primary) hypertension: Secondary | ICD-10-CM

## 2019-01-18 DIAGNOSIS — H43813 Vitreous degeneration, bilateral: Secondary | ICD-10-CM

## 2019-01-18 DIAGNOSIS — H35033 Hypertensive retinopathy, bilateral: Secondary | ICD-10-CM

## 2019-01-18 DIAGNOSIS — H353231 Exudative age-related macular degeneration, bilateral, with active choroidal neovascularization: Secondary | ICD-10-CM | POA: Diagnosis not present

## 2019-01-18 DIAGNOSIS — H2513 Age-related nuclear cataract, bilateral: Secondary | ICD-10-CM

## 2019-01-18 DIAGNOSIS — D3131 Benign neoplasm of right choroid: Secondary | ICD-10-CM

## 2019-02-03 ENCOUNTER — Other Ambulatory Visit: Payer: Self-pay | Admitting: Physician Assistant

## 2019-02-14 ENCOUNTER — Ambulatory Visit (INDEPENDENT_AMBULATORY_CARE_PROVIDER_SITE_OTHER): Payer: Medicare Other | Admitting: *Deleted

## 2019-02-14 DIAGNOSIS — I442 Atrioventricular block, complete: Secondary | ICD-10-CM

## 2019-02-14 LAB — CUP PACEART REMOTE DEVICE CHECK
Battery Impedance: 249 Ohm
Battery Remaining Longevity: 112 mo
Battery Voltage: 2.78 V
Brady Statistic AP VP Percent: 16 %
Brady Statistic AP VS Percent: 0 %
Brady Statistic AS VP Percent: 84 %
Brady Statistic AS VS Percent: 0 %
Date Time Interrogation Session: 20200610154241
Implantable Lead Implant Date: 20160512
Implantable Lead Implant Date: 20160512
Implantable Lead Location: 753859
Implantable Lead Location: 753860
Implantable Lead Model: 5076
Implantable Lead Model: 5092
Implantable Pulse Generator Implant Date: 20160512
Lead Channel Impedance Value: 490 Ohm
Lead Channel Impedance Value: 705 Ohm
Lead Channel Pacing Threshold Amplitude: 0.625 V
Lead Channel Pacing Threshold Amplitude: 0.75 V
Lead Channel Pacing Threshold Pulse Width: 0.4 ms
Lead Channel Pacing Threshold Pulse Width: 0.4 ms
Lead Channel Setting Pacing Amplitude: 2 V
Lead Channel Setting Pacing Amplitude: 2.5 V
Lead Channel Setting Pacing Pulse Width: 0.4 ms
Lead Channel Setting Sensing Sensitivity: 2.8 mV

## 2019-02-15 ENCOUNTER — Encounter (INDEPENDENT_AMBULATORY_CARE_PROVIDER_SITE_OTHER): Payer: Medicare Other | Admitting: Ophthalmology

## 2019-02-15 ENCOUNTER — Other Ambulatory Visit: Payer: Self-pay

## 2019-02-15 DIAGNOSIS — H43813 Vitreous degeneration, bilateral: Secondary | ICD-10-CM | POA: Diagnosis not present

## 2019-02-15 DIAGNOSIS — I10 Essential (primary) hypertension: Secondary | ICD-10-CM | POA: Diagnosis not present

## 2019-02-15 DIAGNOSIS — H35033 Hypertensive retinopathy, bilateral: Secondary | ICD-10-CM

## 2019-02-15 DIAGNOSIS — H353231 Exudative age-related macular degeneration, bilateral, with active choroidal neovascularization: Secondary | ICD-10-CM

## 2019-02-15 DIAGNOSIS — D3131 Benign neoplasm of right choroid: Secondary | ICD-10-CM | POA: Diagnosis not present

## 2019-02-23 ENCOUNTER — Encounter: Payer: Self-pay | Admitting: Cardiology

## 2019-02-23 NOTE — Progress Notes (Signed)
Remote pacemaker transmission.   

## 2019-03-15 ENCOUNTER — Other Ambulatory Visit: Payer: Self-pay

## 2019-03-15 ENCOUNTER — Encounter (INDEPENDENT_AMBULATORY_CARE_PROVIDER_SITE_OTHER): Payer: Medicare Other | Admitting: Ophthalmology

## 2019-03-15 DIAGNOSIS — H35033 Hypertensive retinopathy, bilateral: Secondary | ICD-10-CM | POA: Diagnosis not present

## 2019-03-15 DIAGNOSIS — H353231 Exudative age-related macular degeneration, bilateral, with active choroidal neovascularization: Secondary | ICD-10-CM

## 2019-03-15 DIAGNOSIS — H43813 Vitreous degeneration, bilateral: Secondary | ICD-10-CM | POA: Diagnosis not present

## 2019-03-15 DIAGNOSIS — D3131 Benign neoplasm of right choroid: Secondary | ICD-10-CM | POA: Diagnosis not present

## 2019-03-15 DIAGNOSIS — I10 Essential (primary) hypertension: Secondary | ICD-10-CM

## 2019-03-15 DIAGNOSIS — H2513 Age-related nuclear cataract, bilateral: Secondary | ICD-10-CM

## 2019-04-12 ENCOUNTER — Encounter (INDEPENDENT_AMBULATORY_CARE_PROVIDER_SITE_OTHER): Payer: Medicare Other | Admitting: Ophthalmology

## 2019-04-12 ENCOUNTER — Other Ambulatory Visit: Payer: Self-pay

## 2019-04-12 DIAGNOSIS — I10 Essential (primary) hypertension: Secondary | ICD-10-CM

## 2019-04-12 DIAGNOSIS — H43813 Vitreous degeneration, bilateral: Secondary | ICD-10-CM | POA: Diagnosis not present

## 2019-04-12 DIAGNOSIS — H353231 Exudative age-related macular degeneration, bilateral, with active choroidal neovascularization: Secondary | ICD-10-CM

## 2019-04-12 DIAGNOSIS — H35033 Hypertensive retinopathy, bilateral: Secondary | ICD-10-CM

## 2019-04-12 DIAGNOSIS — H2513 Age-related nuclear cataract, bilateral: Secondary | ICD-10-CM

## 2019-04-12 DIAGNOSIS — D3131 Benign neoplasm of right choroid: Secondary | ICD-10-CM | POA: Diagnosis not present

## 2019-05-10 ENCOUNTER — Encounter (INDEPENDENT_AMBULATORY_CARE_PROVIDER_SITE_OTHER): Payer: Medicare Other | Admitting: Ophthalmology

## 2019-05-10 ENCOUNTER — Other Ambulatory Visit: Payer: Self-pay

## 2019-05-10 DIAGNOSIS — H35033 Hypertensive retinopathy, bilateral: Secondary | ICD-10-CM

## 2019-05-10 DIAGNOSIS — H353231 Exudative age-related macular degeneration, bilateral, with active choroidal neovascularization: Secondary | ICD-10-CM

## 2019-05-10 DIAGNOSIS — I10 Essential (primary) hypertension: Secondary | ICD-10-CM

## 2019-05-10 DIAGNOSIS — H43813 Vitreous degeneration, bilateral: Secondary | ICD-10-CM

## 2019-05-10 DIAGNOSIS — D3131 Benign neoplasm of right choroid: Secondary | ICD-10-CM | POA: Diagnosis not present

## 2019-05-22 ENCOUNTER — Ambulatory Visit (INDEPENDENT_AMBULATORY_CARE_PROVIDER_SITE_OTHER): Payer: Medicare Other | Admitting: *Deleted

## 2019-05-22 DIAGNOSIS — I441 Atrioventricular block, second degree: Secondary | ICD-10-CM | POA: Diagnosis not present

## 2019-05-22 LAB — CUP PACEART REMOTE DEVICE CHECK
Battery Impedance: 273 Ohm
Battery Remaining Longevity: 108 mo
Battery Voltage: 2.78 V
Brady Statistic AP VP Percent: 17 %
Brady Statistic AP VS Percent: 0 %
Brady Statistic AS VP Percent: 83 %
Brady Statistic AS VS Percent: 0 %
Date Time Interrogation Session: 20200915151938
Implantable Lead Implant Date: 20160512
Implantable Lead Implant Date: 20160512
Implantable Lead Location: 753859
Implantable Lead Location: 753860
Implantable Lead Model: 5076
Implantable Lead Model: 5092
Implantable Pulse Generator Implant Date: 20160512
Lead Channel Impedance Value: 490 Ohm
Lead Channel Impedance Value: 672 Ohm
Lead Channel Pacing Threshold Amplitude: 0.625 V
Lead Channel Pacing Threshold Amplitude: 0.875 V
Lead Channel Pacing Threshold Pulse Width: 0.4 ms
Lead Channel Pacing Threshold Pulse Width: 0.4 ms
Lead Channel Setting Pacing Amplitude: 2 V
Lead Channel Setting Pacing Amplitude: 2.5 V
Lead Channel Setting Pacing Pulse Width: 0.4 ms
Lead Channel Setting Sensing Sensitivity: 2.8 mV

## 2019-05-24 NOTE — Progress Notes (Signed)
Cardiology Office Note   Date:  05/25/2019   ID:  [name] [name], DOB 13-Feb-1936, MRN [id number]  PCP:  [name], MD  Cardiologist:   [name], MD   Chief Complaint  Patient presents with  . Wobbling    History of Present Illness: [name] [name] is a 83 y.o. female who presents for evaluation of difficult to control hypertension.  She had heart block and did have a pacemaker placed.   Since I last saw her she is no longer having the spells that she was having.  She was describing the wobbly spells for her blood pressure to go up.  She did have some stress with her taxes being done and her husband being in a nursing home for short while.  This is all behind her.  She is no longer having episodes.  She has no new shortness of breath, PND or orthopnea.  She gets around with a cane at times.  Is not having any new chest pressure, neck or arm discomfort.  Past Medical History:  Diagnosis Date  . Anxiety   . Broken arm Right  . Broken leg   . Complete heart block ([place])   . Hypertension   . Postural dizziness     Past Surgical History:  Procedure Laterality Date  . EP IMPLANTABLE DEVICE N/A 01/16/2015   MDT Adapta L PPM implanted by Dr Rayann Heman for complete heart block  . IR GENERIC HISTORICAL  09/15/2016   IR RADIOLOGIST EVAL & MGMT 09/15/2016 [facility]  . IR GENERIC HISTORICAL  09/28/2016   IR KYPHO THORACIC WITH BONE BIOPSY 09/28/2016 Luanne Bras, MD [facility]  . IR GENERIC HISTORICAL  10/14/2016   IR RADIOLOGIST EVAL & MGMT 10/14/2016 [facility]  . PACEMAKER INSERTION N/A 01/2015     Current Outpatient Medications  Medication Sig Dispense Refill  . acetaminophen (TYLENOL) 500 MG tablet Take 500 mg by mouth every 6 (six) hours as needed for mild pain.    Marland Kitchen ALPRAZolam (XANAX) 0.25 MG tablet Take 0.125 tablets by mouth daily as needed for anxiety.     Marland Kitchen amLODipine (NORVASC) 5 MG tablet TAKE 1  TABLET ONCE DAILY AND MAY TAKE ADDITIONAL HALF TABLET DAILY IFNEEDED. 90 tablet 1  . aspirin 325 MG tablet Take 650 mg by mouth at bedtime as needed for moderate pain. Will take at bedtime if needed for pain    . aspirin 81 MG tablet Take 81 mg by mouth daily.    Marland Kitchen Besifloxacin HCl (BESIVANCE) 0.6 % SUSP Apply 1 drop to eye 4 (four) times daily. For 2 days after injection    . calcium carbonate (TUMS EX) 750 MG chewable tablet Chew 1 tablet by mouth 2 (two) times daily as needed.     Marland Kitchen CALCIUM PO Take 600 mg by mouth daily.     . Cholecalciferol (VITAMIN D3) 2000 units TABS Take 1 tablet by mouth daily.    . famotidine (PEPCID) 20 MG tablet Take 10-20 mg by mouth daily.     . fluticasone (FLONASE) 50 MCG/ACT nasal spray Place 2 sprays into both nostrils daily as needed for allergies.     Marland Kitchen lidocaine (LIDODERM) 5 % Place 1 patch onto the skin daily as needed. Remove & Discard patch within 12 hours or as directed by MD    . Magnesium Hydroxide (PHILLIPS MILK OF MAGNESIA PO) Take 1-2 tablets by mouth 2 (two) times daily as needed.    . Multiple Vitamins-Minerals (ICAPS MV PO)  Take 1 tablet by mouth daily.    Marland Kitchen PRESCRIPTION MEDICATION Place into both eyes every 30 (thirty) days. Avastin injection in left eye every 4 weeks by Dr Hilma Favors Next injection due 10-06-2016      No current facility-administered medications for this visit.     Allergies:   Hctz [hydrochlorothiazide], Losartan, Macrolides and ketolides, Benazepril, and Penicillins    ROS:  Please see the history of present illness.   Otherwise, review of systems are positive for recent upper respiratory and cold-like symptoms.   All other systems are reviewed and negative.    PHYSICAL EXAM: VS:  BP 132/82   Pulse 71   Temp (!) 97.1 F (36.2 C) (Temporal)   Ht 5\' 4"  (1.626 m)   Wt 167 lb (75.8 kg)   SpO2 98%   BMI 28.67 kg/m  , BMI Body mass index is 28.67 kg/m.  GENERAL:  Well appearing NECK:  No jugular venous distention,  waveform within normal limits, carotid upstroke brisk and symmetric, right carotid bruits, no thyromegaly LUNGS:  Clear to auscultation bilaterally CHEST:  Unremarkable HEART:  PMI not displaced or sustained,S1 and S2 within normal limits, no S3, no S4, no clicks, no rubs, 2 out of 6 apical systolic murmur radiating slightly at aortic outflow tract, no diastolic murmurs ABD:  Flat, positive bowel sounds normal in frequency in pitch, no bruits, no rebound, no guarding, no midline pulsatile mass, no hepatomegaly, no splenomegaly EXT:  2 plus pulses throughout, no edema, no cyanosis no clubbing   EKG:  EKG is not ordered today.    Recent Labs: No results found for requested labs within last 8760 hours.      Wt Readings from Last 3 Encounters:  05/25/19 167 lb (75.8 kg)  11/13/18 184 lb 9.6 oz (83.7 kg)  09/28/18 184 lb (83.5 kg)      Other studies Reviewed: Additional studies/ records that were reviewed today include: Device interrogation Review of the above records demonstrates:   See below   ASSESSMENT AND PLAN:  Complete heart block/Status post pacemaker She is up-to-date with pacemaker follow up.  I reviewed the one that was done on the 15th of this month.   HTN Her blood pressure is well controlled.  No change in therapy.    DIZZINESS This is really not dizziness but wobbliness and she is no longer having this.  No change in therapy.    MURMUR She has some aortic sclerosis without stenosis.  No change in therapy or further imaging.  Current medicines are reviewed at length with the patient today.  The patient does not have concerns regarding medicines.  The following changes have been made:  None  Labs/ tests ordered today include:  None  No orders of the defined types were placed in this encounter.    Disposition:   FU with me in 12 months.     Signed, [name], MD  05/25/2019 1:10 PM    [facility]

## 2019-05-25 ENCOUNTER — Ambulatory Visit (INDEPENDENT_AMBULATORY_CARE_PROVIDER_SITE_OTHER): Payer: Medicare Other | Admitting: Cardiology

## 2019-05-25 ENCOUNTER — Other Ambulatory Visit: Payer: Self-pay

## 2019-05-25 ENCOUNTER — Encounter: Payer: Self-pay | Admitting: Cardiology

## 2019-05-25 VITALS — BP 132/82 | HR 71 | Temp 97.1°F | Ht 64.0 in | Wt 167.0 lb

## 2019-05-25 DIAGNOSIS — I442 Atrioventricular block, complete: Secondary | ICD-10-CM | POA: Diagnosis not present

## 2019-05-25 DIAGNOSIS — R42 Dizziness and giddiness: Secondary | ICD-10-CM | POA: Diagnosis not present

## 2019-05-25 DIAGNOSIS — I10 Essential (primary) hypertension: Secondary | ICD-10-CM

## 2019-05-25 NOTE — Patient Instructions (Signed)
Medication Instructions:  Your physician recommends that you continue on your current medications as directed. Please refer to the Current Medication list given to you today.  If you need a refill on your cardiac medications before your next appointment, please call your pharmacy.   Lab work: None ordered If you have labs (blood work) drawn today and your tests are completely normal, you will receive your results only by: [place] (if you have MyChart) OR A paper copy in the mail If you have any lab test that is abnormal or we need to change your treatment, we will call you to review the results.  Testing/Procedures: None ordered  Follow-Up: At [facility], you and your health needs are our priority.  As part of our continuing mission to provide you with exceptional heart care, we have created designated Provider Care Teams.  These Care Teams include your primary Cardiologist (physician) and Advanced Practice Providers (APPs -  Physician Assistants and Nurse Practitioners) who all work together to provide you with the care you need, when you need it. You will need a follow up appointment in 12 months.  Please call our office 2 months in advance to schedule this appointment.  You may see Dr. Percival Spanish or one of the following Advanced Practice Providers on your designated Care Team:   Rosaria Ferries, PA-C Jory Sims, DNP, ANP

## 2019-05-28 ENCOUNTER — Telehealth: Payer: Self-pay | Admitting: Pharmacist Clinician (PhC)/ Clinical Pharmacy Specialist

## 2019-05-28 NOTE — Telephone Encounter (Signed)
Patient called, concerned about elevated BP readings today.  157/87 then later 140/90.  She states she took 1/2 alprazolam with lunch today to help bring it down.  Also has instructions that she can take extra 1/2 tablet of amlodipine if needed, but did not feel that it was necessary today.   States she ate salty tuna salad on Friday, then ate out Saturday (lunch and dinner) as well as Sunday (lunch) then had deli sandwich for dinner last night.  Agree with her assessment that she probably had too much sodium, as she normally does not eat out, but prefers to cook at home herself.   She will watch her BP, but check no more than twice daily.   She can reach out to [place] in another week should her readings remain elevated.

## 2019-05-29 NOTE — Progress Notes (Signed)
Remote pacemaker transmission.   

## 2019-06-05 ENCOUNTER — Other Ambulatory Visit: Payer: Self-pay

## 2019-06-05 ENCOUNTER — Ambulatory Visit (INDEPENDENT_AMBULATORY_CARE_PROVIDER_SITE_OTHER): Payer: Medicare Other | Admitting: Orthopaedic Surgery

## 2019-06-05 ENCOUNTER — Ambulatory Visit: Payer: Self-pay

## 2019-06-05 ENCOUNTER — Encounter: Payer: Self-pay | Admitting: Orthopaedic Surgery

## 2019-06-05 VITALS — BP 153/87 | HR 77 | Ht 64.0 in | Wt 167.0 lb

## 2019-06-05 DIAGNOSIS — M545 Low back pain, unspecified: Secondary | ICD-10-CM

## 2019-06-05 DIAGNOSIS — M8580 Other specified disorders of bone density and structure, unspecified site: Secondary | ICD-10-CM | POA: Insufficient documentation

## 2019-06-05 DIAGNOSIS — M47817 Spondylosis without myelopathy or radiculopathy, lumbosacral region: Secondary | ICD-10-CM | POA: Insufficient documentation

## 2019-06-05 DIAGNOSIS — M5137 Other intervertebral disc degeneration, lumbosacral region: Secondary | ICD-10-CM | POA: Diagnosis not present

## 2019-06-05 DIAGNOSIS — M8588 Other specified disorders of bone density and structure, other site: Secondary | ICD-10-CM | POA: Diagnosis not present

## 2019-06-05 NOTE — Progress Notes (Signed)
Office Visit Note   Patient: [name] [name]           Date of Birth: August 03, 1936           MRN: [id number] Visit Date: 06/05/2019              Requested by: [name], MD [address] [place],  [place] [address] PCP: [name], MD   Assessment & Plan: Visit Diagnoses:  1. Acute bilateral low back pain, unspecified whether sciatica present   2. DDD (degenerative disc disease), lumbosacral   3. Facet arthritis of lumbosacral region   4. Osteopenia of lumbar spine     Plan:  #1: She will use some of her husbands Voltaren gel to the area that is painful. #2: She can use ice in the evening and hot water in the mornings to get control of her pain and discomfort. #3: We did try a lumbar support but because of her hernia ventrally it was not beneficial. #4: If she continues to have the symptoms she can give [place] a call and we will order a CT scan of the lumbar spine since she has a pacemaker.  Does not  Follow-Up Instructions: Return if symptoms worsen or fail to improve.   Orders:  Orders Placed This Encounter  Procedures  . XR Lumbar Spine 2-3 Views   No orders of the defined types were placed in this encounter.     Procedures: No procedures performed   Clinical Data: No additional findings.   Subjective: Chief Complaint  Patient presents with  . Lower Back - Pain  HPI Patient presents today for lower back pain. She said that it started Friday 06/01/2019 after staying bent over for a prolonged period sorting through boxes. No injury. Her pain is located across her lower back. She noticed some pain down her right leg this morning. Going up and down steps is not comfortable, but she can do it. No weakness, numbness, or tingling in her lower extremities. She has not taken anything for pain. She had a kyphoplasty in thoracic area in 2017.   Review of Systems  Constitutional: Negative for fatigue.  HENT: Negative for ear pain.   Eyes: Negative for pain.   Respiratory: Negative for shortness of breath.   Cardiovascular: Negative for leg swelling.  Gastrointestinal: Negative for constipation and diarrhea.  Endocrine: Negative for cold intolerance and heat intolerance.  Genitourinary: Negative for difficulty urinating.  Musculoskeletal: Negative for joint swelling.  Skin: Negative for rash.  Allergic/Immunologic: Negative for food allergies.  Neurological: Negative for weakness.  Hematological: Does not bruise/bleed easily.  Psychiatric/Behavioral: Negative for sleep disturbance.     Objective: Vital Signs: BP (!) 153/87   Pulse 77   Ht 5\' 4"  (1.626 m)   Wt 167 lb (75.8 kg)   BMI 28.67 kg/m   Physical Exam Constitutional:      Appearance: Normal appearance. She is well-developed.  HENT:     Head: Normocephalic.  Eyes:     Pupils: Pupils are equal, round, and reactive to light.  Pulmonary:     Effort: Pulmonary effort is normal.  Skin:    General: Skin is warm and dry.  Neurological:     Mental Status: She is alert and oriented to person, place, and time.  Psychiatric:        Behavior: Behavior normal.     Ortho Exam  Exam today reveals tenderness to palpation over the left SI joint and lower lumbar spine.  She certainly has limited range of motion flexion extension.  Not much with twisting.  Deep tendon reflexes were absent in the lower extremities.  She has great strength in all planes lower extremities.  Sensation is intact to light touch.  Good hip motion without pain.  Specialty Comments:  No specialty comments available.  Imaging: Xr Lumbar Spine 2-3 Views  Result Date: 06/05/2019 2 view x-ray of the lumbar spine reveals compression deformities of L2 and L4.  There are some degenerative changes noted at the left L5-S1 facet area.  Does have some SI degenerative changes noted.  Marked amount of stool in the rectum.    PMFS History: Patient Active Problem List   Diagnosis Date Noted  . DDD (degenerative disc  disease), lumbosacral 06/05/2019  . Facet arthritis of lumbosacral region 06/05/2019  . Osteopenia 06/05/2019  . Fall 08/12/2015  . Tibial plateau fracture 08/11/2015  . Cardiac device in situ   . Second degree Mobitz II AV block   . Hypertension    Past Medical History:  Diagnosis Date  . Anxiety   . Broken arm Right  . Broken leg   . Complete heart block ([place])   . Hypertension   . Postural dizziness     Family History  Problem Relation Age of Onset  . Heart failure Mother   . Hypertension Mother   . Hypertension Father   . Atrial fibrillation Father     Past Surgical History:  Procedure Laterality Date  . EP IMPLANTABLE DEVICE N/A 01/16/2015   MDT Adapta L PPM implanted by Dr Rayann Heman for complete heart block  . IR GENERIC HISTORICAL  09/15/2016   IR RADIOLOGIST EVAL & MGMT 09/15/2016 [facility]  . IR GENERIC HISTORICAL  09/28/2016   IR KYPHO THORACIC WITH BONE BIOPSY 09/28/2016 Luanne Bras, MD [facility]  . IR GENERIC HISTORICAL  10/14/2016   IR RADIOLOGIST EVAL & MGMT 10/14/2016 [facility]  . PACEMAKER INSERTION N/A 01/2015   Social History   Occupational History  . Not on file  Tobacco Use  . Smoking status: Former Smoker    Years: 30.00    Types: Cigarettes    Quit date: 09/06/1986    Years since quitting: 32.7  . Smokeless tobacco: Never Used  Substance and Sexual Activity  . Alcohol use: Yes    Alcohol/week: 0.0 standard drinks    Comment: occasionally 1 monthly  . Drug use: No  . Sexual activity: Not on file

## 2019-06-06 ENCOUNTER — Other Ambulatory Visit: Payer: Self-pay

## 2019-06-06 ENCOUNTER — Encounter (INDEPENDENT_AMBULATORY_CARE_PROVIDER_SITE_OTHER): Payer: Medicare Other | Admitting: Ophthalmology

## 2019-06-06 DIAGNOSIS — H353231 Exudative age-related macular degeneration, bilateral, with active choroidal neovascularization: Secondary | ICD-10-CM

## 2019-06-06 DIAGNOSIS — H35033 Hypertensive retinopathy, bilateral: Secondary | ICD-10-CM | POA: Diagnosis not present

## 2019-06-06 DIAGNOSIS — H43813 Vitreous degeneration, bilateral: Secondary | ICD-10-CM | POA: Diagnosis not present

## 2019-06-06 DIAGNOSIS — I10 Essential (primary) hypertension: Secondary | ICD-10-CM | POA: Diagnosis not present

## 2019-06-11 ENCOUNTER — Ambulatory Visit: Payer: Medicare Other | Admitting: Cardiology

## 2019-06-12 ENCOUNTER — Telehealth: Payer: Self-pay | Admitting: Orthopaedic Surgery

## 2019-06-12 NOTE — Telephone Encounter (Signed)
Spoke to her.  SHe will try ASA and tylenol for pain.  Denies loss of sensation/radicular symptoms

## 2019-06-12 NOTE — Telephone Encounter (Signed)
Please advise 

## 2019-06-12 NOTE — Telephone Encounter (Signed)
Patient calling because Wednesday she slide down steps with right foot. Patent had excruciating pain for a few seconds. Pain now in right side lspine/ hip where it was in left side before[place] request a call to advise her what to do. Pain does hender sleep. Please call to advise.

## 2019-07-01 NOTE — Progress Notes (Signed)
Cardiology Office Note Date:  07/03/2019  Patient ID:  [name] [name], DOB 04-Sep-1936, MRN [id number] PCP:  [name], MD  Cardiologist:  Dr. Percival Spanish Electrophysiologist: Dr. Rayann Heman    Chief Complaint:  annual visit  History of Present Illness: [name] [name] is a 83 y.o. female with history of HTN (historically has been difficult to control), CHB w/PPM.  She comes in today to be seen for Dr. Rayann Heman.  Last seen by him, Sept 2019, doing well, no changes to medicines or device programming were made. More recently saw Dr. Percival Spanish Sept 2020, at that time doing well, no symptoms, BP was OK, no changes were made to her therapy, planned for annual visit.  She is doing quite well.  Staying at home has been very difficulty since she likes to be out and about.  She will walk around the block most days for exercise, no CP, palpitations or SOB, she mentions she does not walk fast, but keeps moviing.  No difficulties with her ADLs.  No dizziness, near syncope or syncope.   Device information MDT dual chamber PPM, implanted 01/16/15,  [facility]   Past Medical History:  Diagnosis Date  . Anxiety   . Broken arm Right  . Broken leg   . Complete heart block ([place])   . Hypertension   . Postural dizziness     Past Surgical History:  Procedure Laterality Date  . EP IMPLANTABLE DEVICE N/A 01/16/2015   MDT Adapta L PPM implanted by Dr Rayann Heman for complete heart block  . IR GENERIC HISTORICAL  09/15/2016   IR RADIOLOGIST EVAL & MGMT 09/15/2016 [facility]  . IR GENERIC HISTORICAL  09/28/2016   IR KYPHO THORACIC WITH BONE BIOPSY 09/28/2016 Luanne Bras, MD [facility]  . IR GENERIC HISTORICAL  10/14/2016   IR RADIOLOGIST EVAL & MGMT 10/14/2016 [facility]  . PACEMAKER INSERTION N/A 01/2015    Current Outpatient Medications  Medication Sig Dispense Refill  . acetaminophen (TYLENOL) 500 MG tablet Take 500 mg by mouth every 6 (six) hours as needed for mild pain.    Marland Kitchen ALPRAZolam (XANAX) 0.25 MG  tablet Take 0.125 tablets by mouth daily as needed for anxiety.     Marland Kitchen amLODipine (NORVASC) 5 MG tablet TAKE 1 TABLET ONCE DAILY AND MAY TAKE ADDITIONAL HALF TABLET DAILY IFNEEDED. 90 tablet 1  . aspirin 325 MG tablet Take 650 mg by mouth at bedtime as needed for moderate pain. Will take at bedtime if needed for pain    . aspirin 81 MG tablet Take 81 mg by mouth daily.    Marland Kitchen Besifloxacin HCl (BESIVANCE) 0.6 % SUSP Apply 1 drop to eye 4 (four) times daily. For 2 days after injection    . calcium carbonate (TUMS EX) 750 MG chewable tablet Chew 1 tablet by mouth 2 (two) times daily as needed.     Marland Kitchen CALCIUM PO Take 600 mg by mouth daily.     . Cholecalciferol (VITAMIN D3) 2000 units TABS Take 1 tablet by mouth daily.    . famotidine (PEPCID) 20 MG tablet Take 10-20 mg by mouth daily.     . fluticasone (FLONASE) 50 MCG/ACT nasal spray Place 2 sprays into both nostrils daily as needed for allergies.     Marland Kitchen lidocaine (LIDODERM) 5 % Place 1 patch onto the skin daily as needed. Remove & Discard patch within 12 hours or as directed by MD    . Magnesium Hydroxide (PHILLIPS MILK OF MAGNESIA PO) Take 1-2 tablets by mouth  2 (two) times daily as needed.    . Multiple Vitamins-Minerals (ICAPS MV PO) Take 1 tablet by mouth daily.    Marland Kitchen PRESCRIPTION MEDICATION Place into both eyes every 30 (thirty) days. Avastin injection in left eye every 4 weeks by Dr Hilma Favors Next injection due 10-06-2016      No current facility-administered medications for this visit.     Allergies:   Hctz [hydrochlorothiazide], Losartan, Macrolides and ketolides, Benazepril, and Penicillins   Social History:  The patient  reports that she quit smoking about 32 years ago. Her smoking use included cigarettes. She quit after 30.00 years of use. She has never used smokeless tobacco. She reports current alcohol use. She reports that she does not use drugs.   Family History:  The patient's family history includes Atrial fibrillation in her father;  Heart failure in her mother; Hypertension in her father and mother.  ROS:  Please see the history of present illness.  All other systems are reviewed and otherwise negative.   PHYSICAL EXAM:  VS:  BP 110/60   Pulse 77   Ht 5\' 4"  (1.626 m)   Wt 167 lb (75.8 kg)   SpO2 98%   BMI 28.67 kg/m  BMI: Body mass index is 28.67 kg/m. Well nourished, well developed, in no acute distress  HEENT: normocephalic, atraumatic  Neck: no JVD, carotid bruits or masses Cardiac:  RRR; no significant murmurs, no rubs, or gallops Lungs:  CTA b/l,  no wheezing, rhonchi or rales  Abd: soft, nontender MS: no deformity, age appropriate atrophy Ext: no edema Skin: warm and dry, no rash Neuro:  No gross deficits appreciated Psych: euthymic mood, full affect  PPM site is stable, no tethering or discomfort   EKG:  Not done today PPM interrogation done today and reviewed by myself: battery and lead measurements are good. P waves 0.25- 0.35 and 0.35-0.50, sensitivity at lowest programming setting available, A lead AP 17% only VP 100% No R waves today at 40bom Pt reported she nearly faints and feels awful every time and asks that I please try to avoid this R threshold is brought to 0.75 with capture and test ended   01/15/15: TTE Study Conclusions - Left ventricle: The cavity size was normal. Systolic function was   normal. The estimated ejection fraction was in the range of 60%   to 65%. Wall motion was normal; there were no regional wall   motion abnormalities. Doppler parameters are consistent with   abnormal left ventricular relaxation (grade 1 diastolic   dysfunction). - Left atrium: The atrium was mildly dilated. - Atrial septum: No defect or patent foramen ovale was identified.     Recent Labs: No results found for requested labs within last 8760 hours.  No results found for requested labs within last 8760 hours.   CrCl cannot be calculated (Patient's most recent lab result is older than  the maximum 21 days allowed.).   Wt Readings from Last 3 Encounters:  07/03/19 167 lb (75.8 kg)  06/05/19 167 lb (75.8 kg)  05/25/19 167 lb (75.8 kg)     Other studies reviewed: Additional studies/records reviewed today include: summarized above  ASSESSMENT AND PLAN:  1. PPM     Intact function, no programming changes made  2. HTN     Looks good, no changes made   Disposition: F/u with Q 3 month remotes and inc-clinic visit annually, sooner if needed   Current medicines are reviewed at length with the patient today.  The  patient did not have any concerns regarding medicines  Signed, Tommye Standard, PA-C 07/03/2019 2:19 PM     [facility] [address] [place]  [place] [address] [phone] (office)  [phone] (fax)

## 2019-07-02 ENCOUNTER — Encounter: Payer: Self-pay | Admitting: Physician Assistant

## 2019-07-03 ENCOUNTER — Other Ambulatory Visit: Payer: Self-pay

## 2019-07-03 ENCOUNTER — Ambulatory Visit: Payer: Medicare Other | Admitting: Physician Assistant

## 2019-07-03 ENCOUNTER — Encounter (INDEPENDENT_AMBULATORY_CARE_PROVIDER_SITE_OTHER): Payer: Self-pay

## 2019-07-03 ENCOUNTER — Encounter: Payer: Self-pay | Admitting: Physician Assistant

## 2019-07-03 ENCOUNTER — Encounter (INDEPENDENT_AMBULATORY_CARE_PROVIDER_SITE_OTHER): Payer: Medicare Other | Admitting: Ophthalmology

## 2019-07-03 VITALS — BP 110/60 | HR 77 | Ht 64.0 in | Wt 167.0 lb

## 2019-07-03 DIAGNOSIS — Z950 Presence of cardiac pacemaker: Secondary | ICD-10-CM

## 2019-07-03 DIAGNOSIS — I441 Atrioventricular block, second degree: Secondary | ICD-10-CM | POA: Diagnosis not present

## 2019-07-03 DIAGNOSIS — I10 Essential (primary) hypertension: Secondary | ICD-10-CM

## 2019-07-03 NOTE — Patient Instructions (Signed)
Medication Instructions:  Your physician recommends that you continue on your current medications as directed. Please refer to the Current Medication list given to you today.  *If you need a refill on your cardiac medications before your next appointment, please call your pharmacy*  Lab Work: NONE If you have labs (blood work) drawn today and your tests are completely normal, you will receive your results only by: Marland Kitchen MyChart Message (if you have MyChart) OR . A paper copy in the mail If you have any lab test that is abnormal or we need to change your treatment, we will call you to review the results.  Testing/Procedures: NONE  Follow-Up: At [facility], you and your health needs are our priority.  As part of our continuing mission to provide you with exceptional heart care, we have created designated Provider Care Teams.  These Care Teams include your primary Cardiologist (physician) and Advanced Practice Providers (APPs -  Physician Assistants and Nurse Practitioners) who all work together to provide you with the care you need, when you need it.  Your next appointment:   REMOTE EVERY THREE MONTHS AND FOLLOW UP IN THE OFFICE IN 1 YEAR.  Provider:   You may see one of the following Advanced Practice Providers on your designated Care Team:    Chanetta Marshall, NP  Tommye Standard, PA-C  Legrand Como "[place]" [place], [place]

## 2019-07-04 ENCOUNTER — Telehealth: Payer: Self-pay

## 2019-07-04 NOTE — Telephone Encounter (Signed)
Returned call to patient of Dr. Percival Spanish concerning BP. She does report being under some stress as their is/was an animal (squirrel?) in her house that was very elusive.   150/84 HR 71 (10am today - didn't feel well) 162/102 (11am ish - took 1/2 xanax) 128/82 (BP has gone down, feels better)  Advised if she notices persistently elevated BP or more frequent BP spikes to let us know Relayed info noted below from device clinic Routed to MD as Juluis Rainier

## 2019-07-04 NOTE — Telephone Encounter (Signed)
Confirmed with Tommye Standard, PA, that there were no programming changes at visit yesterday. Manual transmission received today reveals normal device function, no episodes. Forwarded to NL triage for further management.

## 2019-07-04 NOTE — Telephone Encounter (Signed)
Agree.  She could give [place] a two week BP diary taking it 2 x per day for two weeks.

## 2019-07-04 NOTE — Telephone Encounter (Signed)
The pt blood pressure is up. The pt wants to know if she had any programming changes. I asked the pt to send a manual transmission with her home monitor for the nurse to review and talk with [place]. I told her the nurse will give her a call back. The pt verbalized understanding and states she will wait for the phone call back.

## 2019-07-05 ENCOUNTER — Encounter (INDEPENDENT_AMBULATORY_CARE_PROVIDER_SITE_OTHER): Payer: Medicare Other | Admitting: Ophthalmology

## 2019-07-05 ENCOUNTER — Other Ambulatory Visit: Payer: Self-pay

## 2019-07-05 DIAGNOSIS — H43813 Vitreous degeneration, bilateral: Secondary | ICD-10-CM

## 2019-07-05 DIAGNOSIS — H35033 Hypertensive retinopathy, bilateral: Secondary | ICD-10-CM | POA: Diagnosis not present

## 2019-07-05 DIAGNOSIS — H353231 Exudative age-related macular degeneration, bilateral, with active choroidal neovascularization: Secondary | ICD-10-CM | POA: Diagnosis not present

## 2019-07-05 DIAGNOSIS — D3131 Benign neoplasm of right choroid: Secondary | ICD-10-CM | POA: Diagnosis not present

## 2019-07-05 DIAGNOSIS — I10 Essential (primary) hypertension: Secondary | ICD-10-CM

## 2019-07-06 NOTE — Telephone Encounter (Signed)
Called pt and advised pt of Dr Percival Spanish advice. Pt verbalized understanding.

## 2019-07-07 ENCOUNTER — Encounter

## 2019-07-25 ENCOUNTER — Telehealth: Payer: Self-pay | Admitting: Cardiology

## 2019-07-25 NOTE — Telephone Encounter (Signed)
New Message   Patient states that she will send in the 2 wk b/p diary today per mail per the previous message on 07/04/19. Please call if needed.

## 2019-07-25 NOTE — Telephone Encounter (Signed)
Dr. Percival Spanish asked pt to check BP x 2 weeks

## 2019-07-30 ENCOUNTER — Other Ambulatory Visit: Payer: Self-pay | Admitting: Cardiology

## 2019-07-31 ENCOUNTER — Other Ambulatory Visit: Payer: Self-pay

## 2019-07-31 ENCOUNTER — Encounter (INDEPENDENT_AMBULATORY_CARE_PROVIDER_SITE_OTHER): Payer: Medicare Other | Admitting: Ophthalmology

## 2019-07-31 DIAGNOSIS — H35033 Hypertensive retinopathy, bilateral: Secondary | ICD-10-CM | POA: Diagnosis not present

## 2019-07-31 DIAGNOSIS — H353231 Exudative age-related macular degeneration, bilateral, with active choroidal neovascularization: Secondary | ICD-10-CM

## 2019-07-31 DIAGNOSIS — I10 Essential (primary) hypertension: Secondary | ICD-10-CM | POA: Diagnosis not present

## 2019-07-31 DIAGNOSIS — H43813 Vitreous degeneration, bilateral: Secondary | ICD-10-CM

## 2019-08-10 NOTE — Telephone Encounter (Signed)
I have not seen anything by mail.  Might be floating around the office and I can look at next week.

## 2019-08-10 NOTE — Telephone Encounter (Signed)
Spoke with the patient. She was advised that the message would be sent to Dr. Percival Spanish to see if he had received the blood pressure readings. If he had not, she stated that she could re-mail if needed.

## 2019-08-10 NOTE — Telephone Encounter (Signed)
Patient is calling in regards to still not hearing anything back about the BP readings she mailed in. Please Advise.

## 2019-08-21 ENCOUNTER — Ambulatory Visit (INDEPENDENT_AMBULATORY_CARE_PROVIDER_SITE_OTHER): Payer: Medicare Other | Admitting: *Deleted

## 2019-08-21 DIAGNOSIS — I441 Atrioventricular block, second degree: Secondary | ICD-10-CM

## 2019-08-21 LAB — CUP PACEART REMOTE DEVICE CHECK
Battery Impedance: 297 Ohm
Battery Remaining Longevity: 104 mo
Battery Voltage: 2.78 V
Brady Statistic AP VP Percent: 20 %
Brady Statistic AP VS Percent: 0 %
Brady Statistic AS VP Percent: 80 %
Brady Statistic AS VS Percent: 0 %
Date Time Interrogation Session: 20201215094804
Implantable Lead Implant Date: 20160512
Implantable Lead Implant Date: 20160512
Implantable Lead Location: 753859
Implantable Lead Location: 753860
Implantable Lead Model: 5076
Implantable Lead Model: 5092
Implantable Pulse Generator Implant Date: 20160512
Lead Channel Impedance Value: 483 Ohm
Lead Channel Impedance Value: 635 Ohm
Lead Channel Pacing Threshold Amplitude: 0.625 V
Lead Channel Pacing Threshold Amplitude: 0.875 V
Lead Channel Pacing Threshold Pulse Width: 0.4 ms
Lead Channel Pacing Threshold Pulse Width: 0.4 ms
Lead Channel Setting Pacing Amplitude: 2 V
Lead Channel Setting Pacing Amplitude: 2.5 V
Lead Channel Setting Pacing Pulse Width: 0.4 ms
Lead Channel Setting Sensing Sensitivity: 2.8 mV

## 2019-08-24 ENCOUNTER — Ambulatory Visit: Payer: Self-pay

## 2019-08-24 NOTE — Telephone Encounter (Signed)
Patient called stating that she heard today that she was possibly exposed Wednesday Dec 10th at her dermatologist office.  She states that everyone was masked and the person who became positive didn't show symptoms until the next day. She has spoken with her PCP and was advised as I advised to quarantine for 14 days. She was told that testing was not necessary unless symptomatic by her PCP and our literature. She verbalized understanding and was given  Test site information and scheduling instructions. She verbalized understanding and will continue quarantine.  Reason for Disposition . [1] CLOSE CONTACT COVID-19 EXPOSURE within last 14 days AND [2] NO symptoms  Answer Assessment - Initial Assessment Questions 1. COVID-19 CLOSE CONTACT: "Who is the person with the confirmed or suspected COVID-19 infection that you were exposed to?"     positive 2. PLACE of CONTACT: "Where were you when you were exposed to COVID-19?" (e.g., home, school, medical waiting room; which city?)   Medical area 3. TYPE of CONTACT: "How much contact was there?" (e.g., sitting next to, live in same house, work in same office, same building)     In a chair  skin CA from leg 4. DURATION of CONTACT: "How long were you in contact with the COVID-19 patient?" (e.g., a few seconds, passed by person, a few minutes, 15 minutes or longer, live with the patient)     20 minutes 5. MASK: "Were you wearing a mask?" "Was the other person wearing a mask?" Note: wearing a mask reduces the risk of an  otherwise close contact.     Everybody masked 6. DATE of CONTACT: "When did you have contact with a COVID-19 patient?" (e.g., how many days ago)    Dec 10 7. COMMUNITY SPREAD: "Are there lots of cases of COVID-19 (community spread) where you live?" (See public health department website, if unsure)       yes 8. SYMPTOMS: "Do you have any symptoms?" (e.g., fever, cough, breathing difficulty, loss of taste or smell)     none 9. PREGNANCY OR  POSTPARTUM: "Is there any chance you are pregnant?" "When was your last menstrual period?" "Did you deliver in the last 2 weeks?"     n/a 10. HIGH RISK: "Do you have any heart or lung problems? Do you have a weak immune system?" (e.g., heart failure, COPD, asthma, HIV positive, chemotherapy, renal failure, diabetes mellitus, sickle cell anemia, obesity)       Pace maker, 11.  TRAVEL: "Have you traveled out of the country recently?" If so, "When and where?"  Also ask about out-of-state travel, since the CDC has identified some high-risk cities for community spread in the [place].  Note: Travel becomes less relevant if there is widespread community transmission where the patient lives.    N/A  Protocols used: CORONAVIRUS (COVID-19) EXPOSURE-A-AH

## 2019-08-24 NOTE — Telephone Encounter (Signed)
Spoke with patient and blood pressure  She will resend readings after she checks her blood pressure a couple hours after am medications.

## 2019-08-29 ENCOUNTER — Other Ambulatory Visit: Payer: Self-pay

## 2019-08-29 ENCOUNTER — Encounter (INDEPENDENT_AMBULATORY_CARE_PROVIDER_SITE_OTHER): Payer: Medicare Other | Admitting: Ophthalmology

## 2019-08-29 DIAGNOSIS — D3131 Benign neoplasm of right choroid: Secondary | ICD-10-CM | POA: Diagnosis not present

## 2019-08-29 DIAGNOSIS — H353231 Exudative age-related macular degeneration, bilateral, with active choroidal neovascularization: Secondary | ICD-10-CM

## 2019-08-29 DIAGNOSIS — I10 Essential (primary) hypertension: Secondary | ICD-10-CM

## 2019-08-29 DIAGNOSIS — H35033 Hypertensive retinopathy, bilateral: Secondary | ICD-10-CM

## 2019-08-29 DIAGNOSIS — H43813 Vitreous degeneration, bilateral: Secondary | ICD-10-CM

## 2019-08-29 DIAGNOSIS — H2513 Age-related nuclear cataract, bilateral: Secondary | ICD-10-CM

## 2019-09-12 ENCOUNTER — Telehealth: Payer: Self-pay | Admitting: Cardiology

## 2019-09-12 NOTE — Telephone Encounter (Signed)
Pt will mail B/P log to office attn to Dr Percival Spanish as well as new ins info ./cy

## 2019-09-12 NOTE — Telephone Encounter (Signed)
Patient states she has several pages of BP readings she needs to send. She wants to know if she should mail it to the office. She states she will send her new health insurance information as well. She would also like to know if she can send her husband's new health insurance information at the same time. She states he sees Dr. Stanford Breed.

## 2019-09-22 NOTE — Progress Notes (Signed)
PPM remote 

## 2019-09-25 ENCOUNTER — Telehealth: Payer: Self-pay | Admitting: Cardiology

## 2019-09-25 NOTE — Telephone Encounter (Signed)
New Message  Pt wanted to know if Dr Percival Spanish received her blood pressure readings and wants to speak with a nurse regarding her blood pressure.  Please call to discuss

## 2019-09-25 NOTE — Telephone Encounter (Signed)
Spoke with patient. Patient reports BP has been elevated since having Covid Vaccine on Thursday. Note placed on BP logs sent in by patient for MD review.

## 2019-09-25 NOTE — Telephone Encounter (Signed)
Routed to primary nurse to follow up BP readings that should have been received

## 2019-09-26 ENCOUNTER — Other Ambulatory Visit: Payer: Self-pay

## 2019-09-26 ENCOUNTER — Encounter (INDEPENDENT_AMBULATORY_CARE_PROVIDER_SITE_OTHER): Payer: Medicare PPO | Admitting: Ophthalmology

## 2019-09-26 DIAGNOSIS — D3131 Benign neoplasm of right choroid: Secondary | ICD-10-CM | POA: Diagnosis not present

## 2019-09-26 DIAGNOSIS — H35033 Hypertensive retinopathy, bilateral: Secondary | ICD-10-CM

## 2019-09-26 DIAGNOSIS — I10 Essential (primary) hypertension: Secondary | ICD-10-CM | POA: Diagnosis not present

## 2019-09-26 DIAGNOSIS — H353231 Exudative age-related macular degeneration, bilateral, with active choroidal neovascularization: Secondary | ICD-10-CM | POA: Diagnosis not present

## 2019-09-26 DIAGNOSIS — H43813 Vitreous degeneration, bilateral: Secondary | ICD-10-CM

## 2019-09-27 NOTE — Telephone Encounter (Signed)
Spoke with patient. Dr. Percival Spanish reviewed BP logs, no change in medication at this time. Patient verbalized understanding.

## 2019-10-24 ENCOUNTER — Other Ambulatory Visit: Payer: Self-pay

## 2019-10-24 ENCOUNTER — Encounter (INDEPENDENT_AMBULATORY_CARE_PROVIDER_SITE_OTHER): Payer: Medicare PPO | Admitting: Ophthalmology

## 2019-10-24 DIAGNOSIS — H353231 Exudative age-related macular degeneration, bilateral, with active choroidal neovascularization: Secondary | ICD-10-CM

## 2019-10-24 DIAGNOSIS — D3131 Benign neoplasm of right choroid: Secondary | ICD-10-CM | POA: Diagnosis not present

## 2019-10-24 DIAGNOSIS — I10 Essential (primary) hypertension: Secondary | ICD-10-CM | POA: Diagnosis not present

## 2019-10-24 DIAGNOSIS — H35033 Hypertensive retinopathy, bilateral: Secondary | ICD-10-CM | POA: Diagnosis not present

## 2019-10-24 DIAGNOSIS — H43813 Vitreous degeneration, bilateral: Secondary | ICD-10-CM

## 2019-11-01 ENCOUNTER — Other Ambulatory Visit: Payer: Self-pay | Admitting: Cardiology

## 2019-11-20 ENCOUNTER — Ambulatory Visit (INDEPENDENT_AMBULATORY_CARE_PROVIDER_SITE_OTHER): Payer: Medicare PPO | Admitting: *Deleted

## 2019-11-20 DIAGNOSIS — I441 Atrioventricular block, second degree: Secondary | ICD-10-CM

## 2019-11-21 ENCOUNTER — Encounter (INDEPENDENT_AMBULATORY_CARE_PROVIDER_SITE_OTHER): Payer: Medicare PPO | Admitting: Ophthalmology

## 2019-11-21 ENCOUNTER — Other Ambulatory Visit: Payer: Self-pay

## 2019-11-21 DIAGNOSIS — H353231 Exudative age-related macular degeneration, bilateral, with active choroidal neovascularization: Secondary | ICD-10-CM

## 2019-11-21 DIAGNOSIS — I10 Essential (primary) hypertension: Secondary | ICD-10-CM

## 2019-11-21 DIAGNOSIS — H35033 Hypertensive retinopathy, bilateral: Secondary | ICD-10-CM

## 2019-11-21 DIAGNOSIS — D3131 Benign neoplasm of right choroid: Secondary | ICD-10-CM | POA: Diagnosis not present

## 2019-11-21 DIAGNOSIS — H2513 Age-related nuclear cataract, bilateral: Secondary | ICD-10-CM

## 2019-11-21 DIAGNOSIS — H43813 Vitreous degeneration, bilateral: Secondary | ICD-10-CM

## 2019-11-21 LAB — CUP PACEART REMOTE DEVICE CHECK
Battery Impedance: 345 Ohm
Battery Remaining Longevity: 102 mo
Battery Voltage: 2.78 V
Brady Statistic AP VP Percent: 20 %
Brady Statistic AP VS Percent: 0 %
Brady Statistic AS VP Percent: 80 %
Brady Statistic AS VS Percent: 0 %
Date Time Interrogation Session: 20210316171914
Implantable Lead Implant Date: 20160512
Implantable Lead Implant Date: 20160512
Implantable Lead Location: 753859
Implantable Lead Location: 753860
Implantable Lead Model: 5076
Implantable Lead Model: 5092
Implantable Pulse Generator Implant Date: 20160512
Lead Channel Impedance Value: 476 Ohm
Lead Channel Impedance Value: 756 Ohm
Lead Channel Pacing Threshold Amplitude: 0.75 V
Lead Channel Pacing Threshold Amplitude: 0.875 V
Lead Channel Pacing Threshold Pulse Width: 0.4 ms
Lead Channel Pacing Threshold Pulse Width: 0.4 ms
Lead Channel Setting Pacing Amplitude: 2 V
Lead Channel Setting Pacing Amplitude: 2.5 V
Lead Channel Setting Pacing Pulse Width: 0.4 ms
Lead Channel Setting Sensing Sensitivity: 2.8 mV

## 2019-11-21 NOTE — Progress Notes (Signed)
PPM Remote  

## 2019-12-19 ENCOUNTER — Encounter (INDEPENDENT_AMBULATORY_CARE_PROVIDER_SITE_OTHER): Payer: Medicare PPO | Admitting: Ophthalmology

## 2019-12-19 ENCOUNTER — Other Ambulatory Visit: Payer: Self-pay

## 2019-12-19 DIAGNOSIS — D3131 Benign neoplasm of right choroid: Secondary | ICD-10-CM

## 2019-12-19 DIAGNOSIS — H35033 Hypertensive retinopathy, bilateral: Secondary | ICD-10-CM

## 2019-12-19 DIAGNOSIS — H353231 Exudative age-related macular degeneration, bilateral, with active choroidal neovascularization: Secondary | ICD-10-CM

## 2019-12-19 DIAGNOSIS — I10 Essential (primary) hypertension: Secondary | ICD-10-CM

## 2019-12-19 DIAGNOSIS — H43813 Vitreous degeneration, bilateral: Secondary | ICD-10-CM

## 2020-01-16 ENCOUNTER — Other Ambulatory Visit: Payer: Self-pay

## 2020-01-16 ENCOUNTER — Encounter (INDEPENDENT_AMBULATORY_CARE_PROVIDER_SITE_OTHER): Payer: Medicare PPO | Admitting: Ophthalmology

## 2020-01-16 DIAGNOSIS — I10 Essential (primary) hypertension: Secondary | ICD-10-CM | POA: Diagnosis not present

## 2020-01-16 DIAGNOSIS — H2513 Age-related nuclear cataract, bilateral: Secondary | ICD-10-CM

## 2020-01-16 DIAGNOSIS — D3131 Benign neoplasm of right choroid: Secondary | ICD-10-CM | POA: Diagnosis not present

## 2020-01-16 DIAGNOSIS — H353231 Exudative age-related macular degeneration, bilateral, with active choroidal neovascularization: Secondary | ICD-10-CM | POA: Diagnosis not present

## 2020-01-16 DIAGNOSIS — H35033 Hypertensive retinopathy, bilateral: Secondary | ICD-10-CM

## 2020-01-16 DIAGNOSIS — H43813 Vitreous degeneration, bilateral: Secondary | ICD-10-CM

## 2020-02-13 ENCOUNTER — Encounter (INDEPENDENT_AMBULATORY_CARE_PROVIDER_SITE_OTHER): Payer: Medicare PPO | Admitting: Ophthalmology

## 2020-02-13 DIAGNOSIS — I10 Essential (primary) hypertension: Secondary | ICD-10-CM

## 2020-02-13 DIAGNOSIS — H353231 Exudative age-related macular degeneration, bilateral, with active choroidal neovascularization: Secondary | ICD-10-CM

## 2020-02-13 DIAGNOSIS — H35033 Hypertensive retinopathy, bilateral: Secondary | ICD-10-CM

## 2020-02-13 DIAGNOSIS — H43813 Vitreous degeneration, bilateral: Secondary | ICD-10-CM

## 2020-02-19 ENCOUNTER — Ambulatory Visit (INDEPENDENT_AMBULATORY_CARE_PROVIDER_SITE_OTHER): Payer: Medicare PPO | Admitting: *Deleted

## 2020-02-19 DIAGNOSIS — I441 Atrioventricular block, second degree: Secondary | ICD-10-CM

## 2020-02-19 LAB — CUP PACEART REMOTE DEVICE CHECK
Battery Impedance: 368 Ohm
Battery Remaining Longevity: 101 mo
Battery Voltage: 2.78 V
Brady Statistic AP VP Percent: 19 %
Brady Statistic AP VS Percent: 0 %
Brady Statistic AS VP Percent: 81 %
Brady Statistic AS VS Percent: 0 %
Date Time Interrogation Session: 20210615091730
Implantable Lead Implant Date: 20160512
Implantable Lead Implant Date: 20160512
Implantable Lead Location: 753859
Implantable Lead Location: 753860
Implantable Lead Model: 5076
Implantable Lead Model: 5092
Implantable Pulse Generator Implant Date: 20160512
Lead Channel Impedance Value: 491 Ohm
Lead Channel Impedance Value: 869 Ohm
Lead Channel Pacing Threshold Amplitude: 0.75 V
Lead Channel Pacing Threshold Amplitude: 1 V
Lead Channel Pacing Threshold Pulse Width: 0.4 ms
Lead Channel Pacing Threshold Pulse Width: 0.4 ms
Lead Channel Setting Pacing Amplitude: 2 V
Lead Channel Setting Pacing Amplitude: 2.5 V
Lead Channel Setting Pacing Pulse Width: 0.4 ms
Lead Channel Setting Sensing Sensitivity: 2.8 mV

## 2020-02-20 NOTE — Progress Notes (Signed)
Remote pacemaker transmission.   

## 2020-03-12 ENCOUNTER — Encounter (INDEPENDENT_AMBULATORY_CARE_PROVIDER_SITE_OTHER): Payer: Medicare PPO | Admitting: Ophthalmology

## 2020-03-12 ENCOUNTER — Other Ambulatory Visit: Payer: Self-pay

## 2020-03-12 DIAGNOSIS — I10 Essential (primary) hypertension: Secondary | ICD-10-CM

## 2020-03-12 DIAGNOSIS — D3131 Benign neoplasm of right choroid: Secondary | ICD-10-CM

## 2020-03-12 DIAGNOSIS — H353231 Exudative age-related macular degeneration, bilateral, with active choroidal neovascularization: Secondary | ICD-10-CM

## 2020-03-12 DIAGNOSIS — H35033 Hypertensive retinopathy, bilateral: Secondary | ICD-10-CM

## 2020-03-12 DIAGNOSIS — H43813 Vitreous degeneration, bilateral: Secondary | ICD-10-CM

## 2020-04-14 ENCOUNTER — Encounter (INDEPENDENT_AMBULATORY_CARE_PROVIDER_SITE_OTHER): Payer: Medicare PPO | Admitting: Ophthalmology

## 2020-04-14 ENCOUNTER — Other Ambulatory Visit: Payer: Self-pay | Admitting: Cardiology

## 2020-04-14 ENCOUNTER — Other Ambulatory Visit: Payer: Self-pay

## 2020-04-14 DIAGNOSIS — D3131 Benign neoplasm of right choroid: Secondary | ICD-10-CM

## 2020-04-14 DIAGNOSIS — H35033 Hypertensive retinopathy, bilateral: Secondary | ICD-10-CM

## 2020-04-14 DIAGNOSIS — H43813 Vitreous degeneration, bilateral: Secondary | ICD-10-CM

## 2020-04-14 DIAGNOSIS — I10 Essential (primary) hypertension: Secondary | ICD-10-CM

## 2020-04-14 DIAGNOSIS — H353231 Exudative age-related macular degeneration, bilateral, with active choroidal neovascularization: Secondary | ICD-10-CM | POA: Diagnosis not present

## 2020-05-05 ENCOUNTER — Telehealth: Payer: Self-pay | Admitting: Orthopaedic Surgery

## 2020-05-05 NOTE — Telephone Encounter (Signed)
[place] She was getting out of a low chair on 04/24/2020 and felt something pull. She has went to urgent care and was given Tizanidine to take at bedtime and Meloxicam to take in the mornings. She was feeling better yesterday, but noticing some tingling in her hip area today. A hot shower helps her back. I recommended to continue taking what was given to her, and also try heat. She comes back from the beach next week and will let us know if she is still having problems.

## 2020-05-05 NOTE — Telephone Encounter (Signed)
Patient called. She is on vacation and has hurt her back. Would like to know what to do about the pain. Her call back number is [phone]

## 2020-05-05 NOTE — Telephone Encounter (Signed)
Have not seen her in 1 year-be happy to see her in the office this week but seems she is out of town. If she needs something acutely will need to be seen in an urgent care facility or ER. Other options are OTC meds or voltaren gel

## 2020-05-05 NOTE — Telephone Encounter (Signed)
Please advise 

## 2020-05-14 ENCOUNTER — Encounter (INDEPENDENT_AMBULATORY_CARE_PROVIDER_SITE_OTHER): Payer: Medicare PPO | Admitting: Ophthalmology

## 2020-05-14 ENCOUNTER — Other Ambulatory Visit: Payer: Self-pay

## 2020-05-14 DIAGNOSIS — D3131 Benign neoplasm of right choroid: Secondary | ICD-10-CM | POA: Diagnosis not present

## 2020-05-14 DIAGNOSIS — H353231 Exudative age-related macular degeneration, bilateral, with active choroidal neovascularization: Secondary | ICD-10-CM | POA: Diagnosis not present

## 2020-05-14 DIAGNOSIS — I10 Essential (primary) hypertension: Secondary | ICD-10-CM | POA: Diagnosis not present

## 2020-05-14 DIAGNOSIS — H35033 Hypertensive retinopathy, bilateral: Secondary | ICD-10-CM

## 2020-05-14 DIAGNOSIS — H43813 Vitreous degeneration, bilateral: Secondary | ICD-10-CM

## 2020-05-20 ENCOUNTER — Ambulatory Visit (INDEPENDENT_AMBULATORY_CARE_PROVIDER_SITE_OTHER): Payer: Medicare PPO | Admitting: *Deleted

## 2020-05-20 DIAGNOSIS — I441 Atrioventricular block, second degree: Secondary | ICD-10-CM

## 2020-05-20 LAB — CUP PACEART REMOTE DEVICE CHECK
Battery Impedance: 392 Ohm
Battery Remaining Longevity: 99 mo
Battery Voltage: 2.78 V
Brady Statistic AP VP Percent: 19 %
Brady Statistic AP VS Percent: 0 %
Brady Statistic AS VP Percent: 81 %
Brady Statistic AS VS Percent: 0 %
Date Time Interrogation Session: 20210914112134
Implantable Lead Implant Date: 20160512
Implantable Lead Implant Date: 20160512
Implantable Lead Location: 753859
Implantable Lead Location: 753860
Implantable Lead Model: 5076
Implantable Lead Model: 5092
Implantable Pulse Generator Implant Date: 20160512
Lead Channel Impedance Value: 470 Ohm
Lead Channel Impedance Value: 892 Ohm
Lead Channel Pacing Threshold Amplitude: 0.75 V
Lead Channel Pacing Threshold Amplitude: 0.875 V
Lead Channel Pacing Threshold Pulse Width: 0.4 ms
Lead Channel Pacing Threshold Pulse Width: 0.4 ms
Lead Channel Setting Pacing Amplitude: 2 V
Lead Channel Setting Pacing Amplitude: 2.5 V
Lead Channel Setting Pacing Pulse Width: 0.4 ms
Lead Channel Setting Sensing Sensitivity: 2.8 mV

## 2020-05-21 ENCOUNTER — Other Ambulatory Visit: Payer: Self-pay

## 2020-05-21 ENCOUNTER — Encounter: Payer: Self-pay | Admitting: Orthopaedic Surgery

## 2020-05-21 ENCOUNTER — Ambulatory Visit: Payer: Medicare PPO | Admitting: Orthopaedic Surgery

## 2020-05-21 ENCOUNTER — Ambulatory Visit (INDEPENDENT_AMBULATORY_CARE_PROVIDER_SITE_OTHER): Payer: Medicare PPO

## 2020-05-21 VITALS — Ht 64.0 in | Wt 170.0 lb

## 2020-05-21 DIAGNOSIS — M545 Low back pain, unspecified: Secondary | ICD-10-CM

## 2020-05-21 DIAGNOSIS — M5137 Other intervertebral disc degeneration, lumbosacral region: Secondary | ICD-10-CM | POA: Diagnosis not present

## 2020-05-21 NOTE — Progress Notes (Signed)
Office Visit Note   Patient: [name] [name]           Date of Birth: 05-06-1936           MRN: [id number] Visit Date: 05/21/2020              Requested by: [name], MD [address] [place],  [place] [address] PCP: [name], MD   Assessment & Plan: Visit Diagnoses:  1. Acute midline low back pain, unspecified whether sciatica present   2. DDD (degenerative disc disease), lumbosacral     Plan: [name] [name] developed rather acute onset of back pain when she was at the beach digging in her garden.  She was seen locally where they placed her on meloxicam.  She notes that in the last several days she is actually done quite well and is really not having much discomfort.  She is anxious to get back into her routine.  She has not had any referred pain to either buttock or either lower extremity.  Her past history is significant of that she had kyphoplasty in the thoracic spine about 3 years ago.  Based on her films and her exam today I suspect that she probably had an acute exacerbation of her arthritis.  She is asymptomatic and not neurologically compromised.  She will consider taking aspirin which she has had in the past without problem and which has been approved by her primary care physician and Tylenol.  Be happy to repeat the CT scan as she has a pacemaker should she have further pain.  She will let me know  Follow-Up Instructions: Return if symptoms worsen or fail to improve.   Orders:  Orders Placed This Encounter  Procedures  . XR Lumbar Spine 2-3 Views   No orders of the defined types were placed in this encounter.     Procedures: No procedures performed   Clinical Data: No additional findings.   Subjective: Chief Complaint  Patient presents with  . Lower Back - Pain  Patient presents today for lower back pain. She was at the beach 3 weeks ago and was getting up from a low beach chair, when she experienced pain in her lower back. She states that it  seemed to improve until she was digging in the sand a couple days later. She went to a local urgent care and was given Meloxicam and tizanidine. She states the tizanidine relaxed her to much and was causing her to have an issue holding her bowels. She has been taking the Melxoxicam daily and states that she is feeling much better. She almost did not come in for today's appointment.  Denies any buttock or lower extremity pain.  No shortness of breath or chest pain  HPI  Review of Systems   Objective: Vital Signs: Ht 5\' 4"  (1.626 m)   Wt 170 lb (77.1 kg)   BMI 29.18 kg/m   Physical Exam Constitutional:      Appearance: She is well-developed.  Eyes:     Pupils: Pupils are equal, round, and reactive to light.  Pulmonary:     Effort: Pulmonary effort is normal.  Skin:    General: Skin is warm and dry.  Neurological:     Mental Status: She is alert and oriented to person, place, and time.  Psychiatric:        Behavior: Behavior normal.     Ortho Exam awake alert and oriented x3.  Comfortable sitting and in no acute distress  straight leg raise negative.  Motor exam intact.  Does ambulate with a cane.  There was some very mild percussible tenderness at the thoracolumbar junction but no pain in the upper thoracic or lower lumbar region.  No flank pain Specialty Comments:  No specialty comments available.  Imaging: XR Lumbar Spine 2-3 Views  Result Date: 05/21/2020 Films of the lumbar spine obtained in 2 projections.  These were compared to films that were previously performed.  I do not see any significant change in the lumbar spine.  There is diffuse osteopenia and diffuse degenerative changes at virtually every level.  There appears to be some compression of the L4 vertebral body but no different than prior films.  No obvious acute changes.  There is diffuse calcification of the abdominal aorta.  I measured the widest diameter at 20 mm    PMFS History: Patient Active Problem List    Diagnosis Date Noted  . DDD (degenerative disc disease), lumbosacral 06/05/2019  . Facet arthritis of lumbosacral region 06/05/2019  . Osteopenia 06/05/2019  . Fall 08/12/2015  . Tibial plateau fracture 08/11/2015  . Cardiac device in situ   . Second degree Mobitz II AV block   . Hypertension    Past Medical History:  Diagnosis Date  . Anxiety   . Broken arm Right  . Broken leg   . Complete heart block ([place])   . Hypertension   . Postural dizziness     Family History  Problem Relation Age of Onset  . Heart failure Mother   . Hypertension Mother   . Hypertension Father   . Atrial fibrillation Father     Past Surgical History:  Procedure Laterality Date  . EP IMPLANTABLE DEVICE N/A 01/16/2015   MDT Adapta L PPM implanted by Dr Rayann Heman for complete heart block  . IR GENERIC HISTORICAL  09/15/2016   IR RADIOLOGIST EVAL & MGMT 09/15/2016 [facility]  . IR GENERIC HISTORICAL  09/28/2016   IR KYPHO THORACIC WITH BONE BIOPSY 09/28/2016 Luanne Bras, MD [facility]  . IR GENERIC HISTORICAL  10/14/2016   IR RADIOLOGIST EVAL & MGMT 10/14/2016 [facility]  . PACEMAKER INSERTION N/A 01/2015   Social History   Occupational History  . Not on file  Tobacco Use  . Smoking status: Former Smoker    Years: 30.00    Types: Cigarettes    Quit date: 09/06/1986    Years since quitting: 33.7  . Smokeless tobacco: Never Used  Vaping Use  . Vaping Use: Never used  Substance and Sexual Activity  . Alcohol use: Yes    Alcohol/week: 0.0 standard drinks    Comment: occasionally 1 monthly  . Drug use: No  . Sexual activity: Not on file

## 2020-05-22 NOTE — Progress Notes (Signed)
Remote pacemaker transmission.   

## 2020-05-23 ENCOUNTER — Telehealth: Payer: Self-pay | Admitting: Orthopaedic Surgery

## 2020-05-23 ENCOUNTER — Other Ambulatory Visit: Payer: Self-pay

## 2020-05-23 MED ORDER — MELOXICAM 7.5 MG PO TABS: ORAL_TABLET | ORAL | 0 refills | Status: DC

## 2020-05-23 NOTE — Telephone Encounter (Signed)
Called patient and sent in medication.

## 2020-05-23 NOTE — Telephone Encounter (Signed)
This patient was prescribed meloxicam by [place] about a [place] ago. She came in this past week and saw you in clinic. Please advise.

## 2020-05-23 NOTE — Telephone Encounter (Signed)
Patient called requesting a prescription of meloxicam 7.5 mg. Please send to pharmacy on file. Please call patient when medication is sent in. Patient phone number is [phone].

## 2020-05-23 NOTE — Telephone Encounter (Signed)
Ok to prescribe as written

## 2020-06-03 ENCOUNTER — Ambulatory Visit: Payer: Medicare PPO | Admitting: Cardiology

## 2020-06-09 DIAGNOSIS — I442 Atrioventricular block, complete: Secondary | ICD-10-CM | POA: Insufficient documentation

## 2020-06-09 DIAGNOSIS — R011 Cardiac murmur, unspecified: Secondary | ICD-10-CM | POA: Insufficient documentation

## 2020-06-09 DIAGNOSIS — Z7189 Other specified counseling: Secondary | ICD-10-CM | POA: Insufficient documentation

## 2020-06-09 NOTE — Progress Notes (Signed)
Cardiology Office Note   Date:  06/10/2020   ID:  [name] [name], DOB [birth year], MRN [id number]  PCP:  [name], MD  Cardiologist:   [name], MD   Chief Complaint  Patient presents with  . Shortness of Breath    History of Present Illness: [name] [name] is a 84 y.o. female who presents for evaluation of difficult to control hypertension.  She had heart block and did have a pacemaker placed.   Since I last saw her she hurt her back digging in her flowers.  Took several weeks to get over this.  She moves around with a cane.  She is done well from a cardiovascular standpoint.  She might get short of breath climbing her stairs but she is not having any resting shortness of breath, PND or orthopnea.  She is not had any palpitations, presyncope or syncope.  She has had no weight gain or edema.   Past Medical History:  Diagnosis Date  . Anxiety   . Broken arm Right  . Broken leg   . Complete heart block ([place])   . Hypertension   . Postural dizziness     Past Surgical History:  Procedure Laterality Date  . EP IMPLANTABLE DEVICE N/A 01/16/2015   MDT Adapta L PPM implanted by Dr Rayann Heman for complete heart block  . IR GENERIC HISTORICAL  09/15/2016   IR RADIOLOGIST EVAL & MGMT 09/15/2016 [facility]  . IR GENERIC HISTORICAL  09/28/2016   IR KYPHO THORACIC WITH BONE BIOPSY 09/28/2016 Luanne Bras, MD [facility]  . IR GENERIC HISTORICAL  10/14/2016   IR RADIOLOGIST EVAL & MGMT 10/14/2016 [facility]  . PACEMAKER INSERTION N/A 01/2015     Current Outpatient Medications  Medication Sig Dispense Refill  . acetaminophen (TYLENOL) 500 MG tablet Take 500 mg by mouth every 6 (six) hours as needed for mild pain.    Marland Kitchen ALPRAZolam (XANAX) 0.25 MG tablet Take 0.125 tablets by mouth daily as needed for anxiety.     Marland Kitchen amLODipine (NORVASC) 5 MG tablet TAKE 1 TABLET ONCE DAILY AND MAY TAKE ADDITIONAL HALF TABLET  DAILY IF NEEDED. 90 tablet 0  . aspirin 325 MG tablet Take 650 mg by mouth at bedtime as needed for moderate pain. Will take at bedtime if needed for pain    . aspirin 81 MG tablet Take 81 mg by mouth daily.    Marland Kitchen Besifloxacin HCl (BESIVANCE) 0.6 % SUSP Apply 1 drop to eye 4 (four) times daily. For 2 days after injection    . calcium carbonate (TUMS EX) 750 MG chewable tablet Chew 1 tablet by mouth 2 (two) times daily as needed.     Marland Kitchen CALCIUM PO Take 600 mg by mouth daily.     . Cholecalciferol (VITAMIN D3) 2000 units TABS Take 1 tablet by mouth daily.    . famotidine (PEPCID) 20 MG tablet Take 10-20 mg by mouth daily.     . fluticasone (FLONASE) 50 MCG/ACT nasal spray Place 2 sprays into both nostrils daily as needed for allergies.     Marland Kitchen lidocaine (LIDODERM) 5 % Place 1 patch onto the skin daily as needed. Remove & Discard patch within 12 hours or as directed by MD    . Magnesium Hydroxide (PHILLIPS MILK OF MAGNESIA PO) Take 1-2 tablets by mouth 2 (two) times daily as needed.    . meloxicam (MOBIC) 7.5 MG tablet Take one tablet daily. 30 tablet 0  . Multiple Vitamins-Minerals (ICAPS MV PO)  Take 1 tablet by mouth daily.    Marland Kitchen PRESCRIPTION MEDICATION Place into both eyes every 30 (thirty) days. Avastin injection in right eye, [place]lea in left eye every 4 weeks by Dr Zigmund Daniel     No current facility-administered medications for this visit.    Allergies:   Hctz [hydrochlorothiazide], Losartan, Macrolides and ketolides, Benazepril, and Penicillins    ROS:  Please see the history of present illness.   Otherwise, review of systems are positive for none.   All other systems are reviewed and negative.    PHYSICAL EXAM: VS:  BP (!) 149/74   Pulse 73   Ht 5\' 4"  (1.626 m)   Wt 173 lb (78.5 kg)   SpO2 98%   BMI 29.70 kg/m  , BMI Body mass index is 29.7 kg/m.  GENERAL:  Well appearing NECK:  No jugular venous distention, waveform within normal limits, carotid upstroke brisk and symmetric, no bruits,  no thyromegaly LUNGS:  Clear to auscultation bilaterally CHEST:  Unremarkable HEART:  PMI not displaced or sustained,S1 and S2 within normal limits, no S3, no S4, no clicks, no rubs, 3 of 6 mid peaking systolic murmur radiating up aortic outflow tract, no diastolic murmurs ABD:  Flat, positive bowel sounds normal in frequency in pitch, no bruits, no rebound, no guarding, no midline pulsatile mass, no hepatomegaly, no splenomegaly EXT:  2 plus pulses throughout, no edema, no cyanosis no clubbing   EKG:  EKG is  ordered today. Sinus rhythm with ventricular pacing 100% capture   Recent Labs: No results found for requested labs within last 8760 hours.      Wt Readings from Last 3 Encounters:  06/10/20 173 lb (78.5 kg)  05/21/20 170 lb (77.1 kg)  07/03/19 167 lb (75.8 kg)      Other studies Reviewed: Additional studies/ records that were reviewed today include: None Review of the above records demonstrates:   See elsewhere   ASSESSMENT AND PLAN:  Complete heart block/Status post pacemaker  she is up-to-date with pacemaker follow up.  She is overdue for annual in patient EP follow-up but I will put this down for 6 months so we can alternate our visits.   HTN Her blood pressure is mildly elevated and she is going to keep a blood pressure diary.  I might need to advance her amlodipine.    Murmur She had mildly calcified aortic valve leaflets in 2016.  The murmur sounds somewhat louder and I am going to repeat an echocardiogram.  Covid education She has had her Moderna vaccines and she will be interested in the booster when it is available.    Current medicines are reviewed at length with the patient today.  The patient does not have concerns regarding medicines.  The following changes have been made:  None  Labs/ tests ordered today include:  None  Orders Placed This Encounter  Procedures  . EKG 12-Lead  . ECHOCARDIOGRAM COMPLETE     Disposition:   FU with me in 12  months.     Signed, [name], MD  06/10/2020 5:08 PM    [facility]

## 2020-06-10 ENCOUNTER — Encounter: Payer: Self-pay | Admitting: Cardiology

## 2020-06-10 ENCOUNTER — Ambulatory Visit (INDEPENDENT_AMBULATORY_CARE_PROVIDER_SITE_OTHER): Payer: Medicare PPO | Admitting: Cardiology

## 2020-06-10 ENCOUNTER — Other Ambulatory Visit: Payer: Self-pay

## 2020-06-10 VITALS — BP 149/74 | HR 73 | Ht 64.0 in | Wt 173.0 lb

## 2020-06-10 DIAGNOSIS — I10 Essential (primary) hypertension: Secondary | ICD-10-CM | POA: Diagnosis not present

## 2020-06-10 DIAGNOSIS — Z7189 Other specified counseling: Secondary | ICD-10-CM

## 2020-06-10 DIAGNOSIS — R011 Cardiac murmur, unspecified: Secondary | ICD-10-CM | POA: Diagnosis not present

## 2020-06-10 DIAGNOSIS — I350 Nonrheumatic aortic (valve) stenosis: Secondary | ICD-10-CM | POA: Diagnosis not present

## 2020-06-10 DIAGNOSIS — I442 Atrioventricular block, complete: Secondary | ICD-10-CM | POA: Diagnosis not present

## 2020-06-10 NOTE — Patient Instructions (Addendum)
Medication Instructions:  Your Physician recommend you continue on your current medication as directed.    *If you need a refill on your cardiac medications before your next appointment, please call your pharmacy*   Lab Work: None ordered   Testing/Procedures: Your physician has requested that you have an echocardiogram. Echocardiography is a painless test that uses sound waves to create images of your heart. It provides your doctor with information about the size and shape of your heart and how well your heart's chambers and valves are working. This procedure takes approximately one hour. There are no restrictions for this procedure. [address]     Follow-Up: At [facility], you and your health needs are our priority.  As part of our continuing mission to provide you with exceptional heart care, we have created designated Provider Care Teams.  These Care Teams include your primary Cardiologist (physician) and Advanced Practice Providers (APPs -  Physician Assistants and Nurse Practitioners) who all work together to provide you with the care you need, when you need it.  We recommend signing up for the patient portal called "MyChart".  Sign up information is provided on this After Visit Summary.  MyChart is used to connect with patients for Virtual Visits (Telemedicine).  Patients are able to view lab/test results, encounter notes, upcoming appointments, etc.  Non-urgent messages can be sent to your provider as well.   To learn more about what you can do with MyChart, go to [email or web address].    Your next appointment:   1 year(s)  The format for your next appointment:   In Person  Provider:   [name], MD

## 2020-06-11 ENCOUNTER — Encounter (INDEPENDENT_AMBULATORY_CARE_PROVIDER_SITE_OTHER): Payer: Medicare PPO | Admitting: Ophthalmology

## 2020-06-11 DIAGNOSIS — I10 Essential (primary) hypertension: Secondary | ICD-10-CM

## 2020-06-11 DIAGNOSIS — D3131 Benign neoplasm of right choroid: Secondary | ICD-10-CM

## 2020-06-11 DIAGNOSIS — H353231 Exudative age-related macular degeneration, bilateral, with active choroidal neovascularization: Secondary | ICD-10-CM

## 2020-06-11 DIAGNOSIS — H43813 Vitreous degeneration, bilateral: Secondary | ICD-10-CM

## 2020-06-11 DIAGNOSIS — H35033 Hypertensive retinopathy, bilateral: Secondary | ICD-10-CM | POA: Diagnosis not present

## 2020-06-26 ENCOUNTER — Other Ambulatory Visit (HOSPITAL_COMMUNITY): Payer: Medicare PPO

## 2020-07-08 ENCOUNTER — Ambulatory Visit (HOSPITAL_COMMUNITY): Payer: Medicare PPO | Attending: Cardiovascular Disease

## 2020-07-08 ENCOUNTER — Other Ambulatory Visit: Payer: Self-pay

## 2020-07-08 DIAGNOSIS — I350 Nonrheumatic aortic (valve) stenosis: Secondary | ICD-10-CM | POA: Diagnosis present

## 2020-07-08 LAB — ECHOCARDIOGRAM COMPLETE
AR max vel: 1.02 cm2
AV Area VTI: 1.02 cm2
AV Area mean vel: 1.15 cm2
AV Mean grad: 10 mmHg
AV Peak grad: 19.7 mmHg
Ao pk vel: 2.22 m/s
Area-P 1/2: 4.36 cm2
S' Lateral: 2.9 cm

## 2020-07-09 ENCOUNTER — Encounter (INDEPENDENT_AMBULATORY_CARE_PROVIDER_SITE_OTHER): Payer: Medicare PPO | Admitting: Ophthalmology

## 2020-07-09 DIAGNOSIS — H35033 Hypertensive retinopathy, bilateral: Secondary | ICD-10-CM | POA: Diagnosis not present

## 2020-07-09 DIAGNOSIS — I10 Essential (primary) hypertension: Secondary | ICD-10-CM

## 2020-07-09 DIAGNOSIS — H43813 Vitreous degeneration, bilateral: Secondary | ICD-10-CM

## 2020-07-09 DIAGNOSIS — H353231 Exudative age-related macular degeneration, bilateral, with active choroidal neovascularization: Secondary | ICD-10-CM | POA: Diagnosis not present

## 2020-07-17 ENCOUNTER — Ambulatory Visit: Payer: Medicare PPO | Attending: Internal Medicine

## 2020-07-17 DIAGNOSIS — Z23 Encounter for immunization: Secondary | ICD-10-CM

## 2020-07-17 NOTE — Progress Notes (Signed)
   Covid-19 Vaccination Clinic  Name:  [name] [name]    MRN: [id number] DOB: 09-17-1935  07/17/2020  [name] [name] was observed post Covid-19 immunization for 15 minutes without incident. She was provided with Vaccine Information Sheet and instruction to access the V-Safe system.   [name] [name] was instructed to call 911 with any severe reactions post vaccine: Marland Kitchen Difficulty breathing  . Swelling of face and throat  . A fast heartbeat  . A bad rash all over body  . Dizziness and weakness

## 2020-08-04 ENCOUNTER — Telehealth: Payer: Self-pay | Admitting: Cardiology

## 2020-08-04 NOTE — Telephone Encounter (Signed)
Spoke with patient and she has felt good today and yesterday. Per patient ED told her could be secondary to booster but unsure. EKG and CT of head ok in ED ED did question patient taking Xanax but she says she only takes 1/2 tablet every 4 weeks when she had injections in eyes She will continue to monitor her blood pressure and call back end of week or beginning of next week with updated readings Advised to stay hydrated and avoid high sodium foods Will forward to Dr Percival Spanish. Will call back if any further recommendations.

## 2020-08-04 NOTE — Telephone Encounter (Signed)
Pt c/o BP issue: STAT if pt c/o blurred vision, one-sided weakness or slurred speech  1. What are your last 5 BP readings?  07/27/20: 173/(not sure)  08/02/20: 166/89      160/67      130/84  08/03/20: 150/86  08/04/20: 124/85  2. Are you having any other symptoms (ex. Dizziness, headache, blurred vision, passed out)? Patient states she felt as if she were going to fall on 07/29/20, but denies vertigo/dizziness. May have been a loss of balance. No additional symptoms.  3. What is your BP issue? Patient states on 07/27/20 she began experiencing a pain above her ear. She states the pain was intermittent, so she ignored it. She drove to the beach for the week and then the pain spread to the crown of her head- still intermittent so she continued to ignore it, but by the night and into the morning of 07/29/20 (3:00 AM) she had a fall. She states at this time her systolic was 492 and she called 911. EMS arrived and performed an EKG- she reports it was normal. Her BP eventually came down, she took half a Xanax for her anxiety and went to sleep. When she woke up that morning she reports he BP was still high (no reading available) and she felt as if she were going to fall again- denies vertigo/dizziness. She called 911 again and went to the hospital by that afternoon. She states her BP was high in the hospital, but she had another EKG and a CT due to pain in head. She reports the CT and EKG were normal. She states while admitted she requested a COVID test, but they would not allow her to take one because she did not show any symptoms of COVID. She remained in the hospital for a few days and was discharged on 07/31/20. She stated by then she was feeling fine. The night of 07/31/20 she states she ate a BBQ sandwich and the following morning her BP was elevated again. She is wondering if her diet is effecting her BP. She would also like to make Dr. Percival Spanish aware that she had her COVID booster shot on 07/17/20  and she would like to know if this can cause these symptoms. Please return call to discuss.

## 2020-08-06 ENCOUNTER — Encounter (INDEPENDENT_AMBULATORY_CARE_PROVIDER_SITE_OTHER): Payer: Medicare PPO | Admitting: Ophthalmology

## 2020-08-06 ENCOUNTER — Other Ambulatory Visit: Payer: Self-pay

## 2020-08-06 DIAGNOSIS — D3131 Benign neoplasm of right choroid: Secondary | ICD-10-CM | POA: Diagnosis not present

## 2020-08-06 DIAGNOSIS — H35033 Hypertensive retinopathy, bilateral: Secondary | ICD-10-CM | POA: Diagnosis not present

## 2020-08-06 DIAGNOSIS — I10 Essential (primary) hypertension: Secondary | ICD-10-CM

## 2020-08-06 DIAGNOSIS — H353231 Exudative age-related macular degeneration, bilateral, with active choroidal neovascularization: Secondary | ICD-10-CM

## 2020-08-06 DIAGNOSIS — H43813 Vitreous degeneration, bilateral: Secondary | ICD-10-CM

## 2020-08-06 NOTE — Telephone Encounter (Signed)
No other suggestions

## 2020-08-19 ENCOUNTER — Ambulatory Visit (INDEPENDENT_AMBULATORY_CARE_PROVIDER_SITE_OTHER): Payer: Medicare PPO

## 2020-08-19 DIAGNOSIS — I442 Atrioventricular block, complete: Secondary | ICD-10-CM | POA: Diagnosis not present

## 2020-08-20 LAB — CUP PACEART REMOTE DEVICE CHECK
Battery Impedance: 465 Ohm
Battery Remaining Longevity: 93 mo
Battery Voltage: 2.78 V
Brady Statistic AP VP Percent: 18 %
Brady Statistic AP VS Percent: 0 %
Brady Statistic AS VP Percent: 82 %
Brady Statistic AS VS Percent: 0 %
Date Time Interrogation Session: 20211215105503
Implantable Lead Implant Date: 20160512
Implantable Lead Implant Date: 20160512
Implantable Lead Location: 753859
Implantable Lead Location: 753860
Implantable Lead Model: 5076
Implantable Lead Model: 5092
Implantable Pulse Generator Implant Date: 20160512
Lead Channel Impedance Value: 470 Ohm
Lead Channel Impedance Value: 890 Ohm
Lead Channel Pacing Threshold Amplitude: 0.625 V
Lead Channel Pacing Threshold Amplitude: 0.875 V
Lead Channel Pacing Threshold Pulse Width: 0.4 ms
Lead Channel Pacing Threshold Pulse Width: 0.4 ms
Lead Channel Setting Pacing Amplitude: 2 V
Lead Channel Setting Pacing Amplitude: 2.5 V
Lead Channel Setting Pacing Pulse Width: 0.4 ms
Lead Channel Setting Sensing Sensitivity: 2.8 mV

## 2020-09-03 NOTE — Progress Notes (Signed)
Remote pacemaker transmission.   

## 2020-09-09 ENCOUNTER — Other Ambulatory Visit: Payer: Self-pay

## 2020-09-09 ENCOUNTER — Encounter (INDEPENDENT_AMBULATORY_CARE_PROVIDER_SITE_OTHER): Payer: Medicare PPO | Admitting: Ophthalmology

## 2020-09-09 DIAGNOSIS — D3131 Benign neoplasm of right choroid: Secondary | ICD-10-CM

## 2020-09-09 DIAGNOSIS — I10 Essential (primary) hypertension: Secondary | ICD-10-CM | POA: Diagnosis not present

## 2020-09-09 DIAGNOSIS — H35033 Hypertensive retinopathy, bilateral: Secondary | ICD-10-CM | POA: Diagnosis not present

## 2020-09-09 DIAGNOSIS — H353231 Exudative age-related macular degeneration, bilateral, with active choroidal neovascularization: Secondary | ICD-10-CM

## 2020-09-09 DIAGNOSIS — H43813 Vitreous degeneration, bilateral: Secondary | ICD-10-CM

## 2020-09-10 ENCOUNTER — Emergency Department (HOSPITAL_COMMUNITY)
Admission: EM | Admit: 2020-09-10 | Discharge: 2020-09-11 | Disposition: A | Discharge status: Home / Self Care | Payer: Medicare PPO | Attending: Emergency Medicine | Admitting: Emergency Medicine

## 2020-09-10 ENCOUNTER — Other Ambulatory Visit: Payer: Self-pay

## 2020-09-10 ENCOUNTER — Encounter (HOSPITAL_COMMUNITY): Payer: Self-pay | Admitting: Emergency Medicine

## 2020-09-10 DIAGNOSIS — R55 Syncope and collapse: Secondary | ICD-10-CM | POA: Diagnosis not present

## 2020-09-10 DIAGNOSIS — Z79899 Other long term (current) drug therapy: Secondary | ICD-10-CM | POA: Insufficient documentation

## 2020-09-10 DIAGNOSIS — I10 Essential (primary) hypertension: Secondary | ICD-10-CM | POA: Insufficient documentation

## 2020-09-10 DIAGNOSIS — Z7982 Long term (current) use of aspirin: Secondary | ICD-10-CM | POA: Diagnosis not present

## 2020-09-10 DIAGNOSIS — Z87891 Personal history of nicotine dependence: Secondary | ICD-10-CM | POA: Diagnosis not present

## 2020-09-10 DIAGNOSIS — Z950 Presence of cardiac pacemaker: Secondary | ICD-10-CM | POA: Insufficient documentation

## 2020-09-10 LAB — URINALYSIS, ROUTINE W REFLEX MICROSCOPIC
Bilirubin Urine: NEGATIVE
Glucose, UA: NEGATIVE mg/dL
Hgb urine dipstick: NEGATIVE
Ketones, ur: NEGATIVE mg/dL
Leukocytes,Ua: NEGATIVE
Nitrite: NEGATIVE
Protein, ur: NEGATIVE mg/dL
Specific Gravity, Urine: 1.016 (ref 1.005–1.030)
pH: 6 (ref 5.0–8.0)

## 2020-09-10 LAB — BASIC METABOLIC PANEL
Anion gap: 11 (ref 5–15)
BUN: 16 mg/dL (ref 8–23)
CO2: 24 mmol/L (ref 22–32)
Calcium: 9.9 mg/dL (ref 8.9–10.3)
Chloride: 103 mmol/L (ref 98–111)
Creatinine, Ser: 0.87 mg/dL (ref 0.44–1.00)
GFR, Estimated: >60 mL/min (ref >60)
Glucose, Bld: 113 mg/dL — ABNORMAL HIGH (ref 70–99)
Potassium: 4.2 mmol/L (ref 3.5–5.1)
Sodium: 138 mmol/L (ref 135–145)

## 2020-09-10 LAB — CBC
HCT: 46.5 % — ABNORMAL HIGH (ref 36.0–46.0)
Hemoglobin: 14.5 g/dL (ref 12.0–15.0)
MCH: 28.7 pg (ref 26.0–34.0)
MCHC: 31.2 g/dL (ref 30.0–36.0)
MCV: 91.9 fL (ref 80.0–100.0)
Platelets: 282 10*3/uL (ref 150–400)
RBC: 5.06 MIL/uL (ref 3.87–5.11)
RDW: 14.9 % (ref 11.5–15.5)
WBC: 5.9 10*3/uL (ref 4.0–10.5)
nRBC: 0 % (ref 0.0–0.2)

## 2020-09-11 ENCOUNTER — Telehealth: Payer: Self-pay | Admitting: Cardiology

## 2020-09-11 LAB — TROPONIN I (HIGH SENSITIVITY): Troponin I (High Sensitivity): 4 ng/L (ref <18)

## 2020-09-11 MED ORDER — AMLODIPINE BESYLATE 5 MG PO TABS
5.0000 mg | ORAL_TABLET | Freq: Once | ORAL | Status: AC
  Administered 2020-09-11: 5 mg via ORAL
  Filled 2020-09-11: qty 1

## 2020-09-11 NOTE — Telephone Encounter (Signed)
Spoke with pt, aware of dr hochrein's recommendations. °Follow up scheduled  °

## 2020-09-11 NOTE — Telephone Encounter (Signed)
We need to have her record her BP 3 x daily for one week and then see if she can get on a APP schedule (could be virtual) with those data.  Thanks.  Leta Jungling

## 2020-09-11 NOTE — ED Provider Notes (Signed)
[facility] EMERGENCY DEPARTMENT Provider Note   CSN: [id number] Arrival date & time: 09/10/20  1532     History Chief Complaint  Patient presents with  . Near Syncope    Patient presents to the ED by EMS with c/o near syncopal episode at home when she woke up, it went away and now has returned.  168/110 83 paced  99% ra 164  cbg    [name] [name] is a 85 y.o. female.  HPI Patient presents after 2 near syncopal episodes.  Had around a 16-hour wait in the waiting room before she was able to come back to her room..  States she woke up feeling a little off.  States she sat up and felt lightheaded.  States she stayed in bed from 30 minutes and then later got up.  States her blood pressure was elevated.  She took some of her medicine and felt little better.  However around noon it came back.  Felt strange again.  States it just washed over her.  Has had episodes like this before.  Was [place] and had an extensive work-up did not find a cause.  Does have some anxiety and states that sometimes she will take some Xanax and feel better.  States her blood pressure was around 123XX123 systolic.  No chest pain.  She does have a pacemaker.  States her heart felt as it was beating a little differently.  Patient did have injections in her eyes yesterday which she does have monthly.  States that went well but did take a Xanax last night.    Past Medical History:  Diagnosis Date  . Anxiety   . Broken arm Right  . Broken leg   . Complete heart block ([place])   . Hypertension   . Postural dizziness     Patient Active Problem List   Diagnosis Date Noted  . Educated about COVID-19 virus infection 06/09/2020  . Murmur 06/09/2020  . CHB (complete heart block) ([place]) 06/09/2020  . DDD (degenerative disc disease), lumbosacral 06/05/2019  . Facet arthritis of lumbosacral region 06/05/2019  . Osteopenia 06/05/2019  . Fall 08/12/2015  . Tibial plateau fracture  08/11/2015  . Cardiac device in situ   . Second degree Mobitz II AV block   . Hypertension     Past Surgical History:  Procedure Laterality Date  . EP IMPLANTABLE DEVICE N/A 01/16/2015   MDT Adapta L PPM implanted by Dr Rayann Heman for complete heart block  . IR GENERIC HISTORICAL  09/15/2016   IR RADIOLOGIST EVAL & MGMT 09/15/2016 [facility]  . IR GENERIC HISTORICAL  09/28/2016   IR KYPHO THORACIC WITH BONE BIOPSY 09/28/2016 Luanne Bras, MD [facility]  . IR GENERIC HISTORICAL  10/14/2016   IR RADIOLOGIST EVAL & MGMT 10/14/2016 [facility]  . PACEMAKER INSERTION N/A 01/2015     OB History   No obstetric history on file.     Family History  Problem Relation Age of Onset  . Heart failure Mother   . Hypertension Mother   . Hypertension Father   . Atrial fibrillation Father     Social History   Tobacco Use  . Smoking status: Former Smoker    Years: 30.00    Types: Cigarettes    Quit date: 09/06/1986    Years since quitting: 34.0  . Smokeless tobacco: Never Used  Vaping Use  . Vaping Use: Never used  Substance Use Topics  .  Alcohol use: Yes    Alcohol/week: 0.0 standard drinks    Comment: occasionally 1 monthly  . Drug use: No    Home Medications Prior to Admission medications   Medication Sig Start Date End Date Taking? Authorizing Provider  acetaminophen (TYLENOL) 500 MG tablet Take 500 mg by mouth every 6 (six) hours as needed for mild pain.    [provider]  ALPRAZolam Duanne Moron) 0.25 MG tablet Take 0.125 tablets by mouth daily as needed for anxiety.  02/04/15   [provider]  amLODipine (NORVASC) 5 MG tablet TAKE 1 TABLET ONCE DAILY AND MAY TAKE ADDITIONAL HALF TABLET DAILY IF NEEDED. 04/14/20   [name], MD  aspirin 325 MG tablet Take 650 mg by mouth at bedtime as needed for moderate pain. Will take at bedtime if needed for pain    [provider]  aspirin 81 MG tablet Take 81 mg by mouth daily.    [provider]   Besifloxacin HCl (BESIVANCE) 0.6 % SUSP Apply 1 drop to eye 4 (four) times daily. For 2 days after injection    [provider]  calcium carbonate (TUMS EX) 750 MG chewable tablet Chew 1 tablet by mouth 2 (two) times daily as needed.     [provider]  CALCIUM PO Take 600 mg by mouth daily.     [provider]  Cholecalciferol (VITAMIN D3) 2000 units TABS Take 1 tablet by mouth daily.    [provider]  famotidine (PEPCID) 20 MG tablet Take 10-20 mg by mouth daily.     [provider]  fluticasone (FLONASE) 50 MCG/ACT nasal spray Place 2 sprays into both nostrils daily as needed for allergies.  12/27/14   [provider]  lidocaine (LIDODERM) 5 % Place 1 patch onto the skin daily as needed. Remove & Discard patch within 12 hours or as directed by MD    [provider]  Magnesium Hydroxide (PHILLIPS MILK OF MAGNESIA PO) Take 1-2 tablets by mouth 2 (two) times daily as needed.    [provider]  meloxicam (MOBIC) 7.5 MG tablet Take one tablet daily. 05/23/20   Garald Balding, MD  Multiple Vitamins-Minerals (ICAPS MV PO) Take 1 tablet by mouth daily.    [provider]  PRESCRIPTION MEDICATION Place into both eyes every 30 (thirty) days. Avastin injection in right eye, Ilea in left eye every 4 weeks by Dr Zigmund Daniel    [provider]    Allergies    Hctz [hydrochlorothiazide], Losartan, Macrolides and ketolides, Benazepril, and Penicillins  Review of Systems   Review of Systems  Constitutional: Negative for appetite change.  HENT: Negative for dental problem.   Respiratory: Negative for shortness of breath.   Cardiovascular: Negative for chest pain.  Gastrointestinal: Negative for abdominal pain.  Musculoskeletal: Negative for back pain.  Skin: Negative for pallor.  Neurological: Positive for light-headedness.  Psychiatric/Behavioral: Negative for confusion.    Physical Exam Updated Vital  Signs BP (!) 122/54   Pulse 74   Temp 98 F (36.7 C) (Oral)   Resp 20   Ht 5\' 4"  (1.626 m)   Wt 76.7 kg   SpO2 96%   BMI 29.01 kg/m   Physical Exam Vitals and nursing note reviewed.  HENT:     Head: Atraumatic.     Right Ear: External ear normal.     Left Ear: External ear normal.  Eyes:     Extraocular Movements: Extraocular movements intact.  Pupils: Pupils are equal, round, and reactive to light.  Cardiovascular:     Rate and Rhythm: Regular rhythm.     Heart sounds: No murmur heard.   Pulmonary:     Breath sounds: No wheezing or rhonchi.  Abdominal:     Tenderness: There is no abdominal tenderness.  Musculoskeletal:     Cervical back: Neck supple.     Right lower leg: No edema.     Left lower leg: No edema.  Skin:    Capillary Refill: Capillary refill takes less than 2 seconds.  Neurological:     General: No focal deficit present.     Mental Status: She is alert and oriented to person, place, and time.  Psychiatric:        Mood and Affect: Mood normal.     ED Results / Procedures / Treatments   Labs (all labs ordered are listed, but only abnormal results are displayed) Labs Reviewed  BASIC METABOLIC PANEL - Abnormal; Notable for the following components:      Result Value   Glucose, Bld 113 (*)    All other components within normal limits  CBC - Abnormal; Notable for the following components:   HCT 46.5 (*)    All other components within normal limits  URINALYSIS, ROUTINE W REFLEX MICROSCOPIC - Abnormal; Notable for the following components:   APPearance HAZY (*)    All other components within normal limits  TROPONIN I (HIGH SENSITIVITY)    EKG EKG Interpretation  Date/Time:  Wednesday September 10 2020 15:47:25 EST Ventricular Rate:  78 PR Interval:  232 QRS Duration: 174 QT Interval:  426 QTC Calculation: 485 R Axis:   -68 Text Interpretation: Atrial-sensed ventricular-paced rhythm with prolonged AV conduction Abnormal ECG Confirmed by  Benjiman Core [phone]) on 09/11/2020 7:44:16 AM   Radiology No results found.  Procedures Procedures (including critical care time)  Medications Ordered in ED Medications  amLODipine (NORVASC) tablet 5 mg (5 mg Oral Given 09/11/20 1055)    ED Course  I have reviewed the triage vital signs and the nursing notes.  Pertinent labs & imaging results that were available during my care of the patient were reviewed by me and considered in my medical decision making (see chart for details).    MDM Rules/Calculators/A&P                          Patient brought in for near syncopal episodes.  Had 2 of them yesterday.  Associate with hypertension.  Has had an episode previously where she had some hypotension with it.  Feeling better now.  A long wait to be seen by me.  Blood pressure is improved.  Nonfocal exam.  States she does get anxiety.  Had uncomplicated injection of her eyes yesterday.  Has pacemaker.  Pacemaker was interrogated and did not show any arrhythmia during the episode.  Lab work overall reassuring.  Patient has had episodes when her blood pressure goes high in the past and with the blood pressure being high may need some medicine adjustment.  Believe she can follow-up with her cardiologist for further adjustment of the medication.  Will discharge home. Final Clinical Impression(s) / ED Diagnoses Final diagnoses:  Near syncope    Rx / DC Orders ED Discharge Orders    None       Benjiman Core, MD 09/11/20 1309

## 2020-09-11 NOTE — Telephone Encounter (Signed)
Pt c/o medication issue:  1. Name of Medication: amLODipine (NORVASC) 5 MG tablet  2. How are you currently taking this medication (dosage and times per day)? As prescribed   3. Are you having a reaction (difficulty breathing--STAT)? No   4. What is your medication issue? [name] [name] is calling wanting to discuss possibly increasing this medication due to her being hospitalized for hypertension and almost passing out from it yesterday. Please advise.

## 2020-09-11 NOTE — Discharge Instructions (Signed)
Potential adjustment of your blood pressure medicines.  The interrogation your pacemaker showed no arrhythmia.

## 2020-09-11 NOTE — Telephone Encounter (Signed)
Spoke with the patient. Patient sts that she has had two near syncopal episodes that she feels is releated to elevated blood pressure. On inquiry both episodes were associated with position change. Patient had an episode last night, her BP systolic was in  The 170's. She  called EMS to be transported to the ED. She was seen after a 10 hour wait. Her BP normalized during her ED stay.  The ED physician adv her to discuss maybe increasing her Amlodipine with Dr. [name]. She is taking her Amlodipine 5 mg every morning. She does not check her BP regularly, but sts that it fluctuates sometimes its high and sometimes it ok.  She is currently asymptomatic.  Adv the patient that I will fwd the msg to Dr. [name] to give his recommendation and his nurse will call back with his response.  Patient agreeable with the plan and voiced appreciation for the call.

## 2020-09-11 NOTE — ED Notes (Signed)
Pt d/c home per MD order. Discharge summary reviewed with pt, pt verbalizes understanding. No s/s of acute distress noted. Off unit via WC. Discharge ride home here,

## 2020-09-16 NOTE — Progress Notes (Signed)
Virtual Visit via Telephone Note   This visit type was conducted due to national recommendations for restrictions regarding the COVID-19 Pandemic (e.g. social distancing) in an effort to limit this patient's exposure and mitigate transmission in our community.  Due to her co-morbid illnesses, this patient is at least at moderate risk for complications without adequate follow up.  This format is felt to be most appropriate for this patient at this time.  The patient did not have access to video technology/had technical difficulties with video requiring transitioning to audio format only (telephone).  All issues noted in this document were discussed and addressed.  No physical exam could be performed with this format.  Please refer to the patient's chart for her  consent to telehealth for [facility].  Evaluation Performed:  Follow-up visit  This visit type was conducted due to national recommendations for restrictions regarding the COVID-19 Pandemic (e.g. social distancing).  This format is felt to be most appropriate for this patient at this time.  All issues noted in this document were discussed and addressed.  No physical exam was performed (except for noted visual exam findings with Video Visits).  Please refer to the patient's chart (MyChart message for video visits and phone note for telephone visits) for the patient's consent to telehealth for [facility]  Date:  09/17/2020   ID:  [name] [name], DOB Jan 07, 1936, MRN [id number]  Patient Location:  [address] [place] [address]   Provider location:     [facility] [address] Office [phone] Fax [phone]   PCP:  Marden Noble, MD  Cardiologist:  Rollene Rotunda, MD  Electrophysiologist:  None   Chief Complaint: Follow-up for near syncope and hypertension  History of Present Illness:    [name] [name] is a 85 y.o. female who presents via audio/video  conferencing for a telehealth visit today.  Patient verified DOB and address.   [name] [name] has a PMH of hypertension, second-degree Mobitz type II, CHB status post PPM 5/16, cardiac murmur  She was last seen by Dr. [name] on 06/10/2020.  During that time she reported that she had injured her back while digging her flowers.  She had recovered but reported that it took several weeks for her to feel better.  She was walking with her cane.  She had done well from a cardiovascular standpoint.  She reported that she would get short of breath with climbing stairs but denied increased work of breathing at rest.  She denied palpitations, presyncope, syncope, and weight gain or lower extremity swelling.  She was overdue for her EP follow-up at that time and it was recommended that she schedule follow-up appointment.  Her blood pressure was mildly elevated and she was instructed to keep a blood pressure log.  Her echocardiogram was repeated 07/08/2020 showed an EF of 50- 55%, G1 DD mild aortic stenosis.  She presented to the emergency department on 09/11/2020.  She reported that she had a near syncopal episode at home when she woke up.  Her symptoms went away and then returned.  Her blood pressure at that time was 168/110, her EKG showed a paced rhythm at 83 bpm.  She reported that she had episodes like this in the past when she was at the beach.  There was an extensive work-up performed while she was at the hospital there which did not find a cause.  She reported that she does suffer from anxiety and that  she takes her Xanax and feels better.  While in the emergency department she denied chest pain.  She did report that she had injections into her eyes the day prior to her 1/22 ED visit.  Her blood pressure normalized.  Her pacemaker was interrogated but did not show any arrhythmias during the episode.  She was discharged in stable condition.  She is seen virtually today and states she has continued to have some  sensation of flushing/lightheadedness over the last week.  She notices that this mainly in the morning when she gets up.  She will take her blood pressure and notes that his elevated in the mornings.  After she takes her medication it comes down in the [id number] systolic over 123456 diastolic.  She has also noted some lower blood pressures in the 110s over 70s.  She reports that her symptoms this week are not as severe as they were last week when she went to the emergency department.  I have instructed her to start wearing lower extremity support stockings, increase her p.o. hydration, and follow-up with electrophysiology.  I will continue her amlodipine 5 mg and 2.5 mg as needed.  We will have her follow-up in 1 month for reevaluation.  Today she denies chest pain, shortness of breath, lower extremity edema, fatigue, palpitations, melena, hematuria, hemoptysis, diaphoresis, weakness, orthopnea, and PND.   The patient does not symptoms concerning for COVID-19 infection (fever, chills, cough, or new SHORTNESS OF BREATH).    Prior CV studies:   The following studies were reviewed today:  Echocardiogram 07/08/2020 IMPRESSIONS    1. Septal dyssynchrony consistent with ventricular pacing. Left  ventricular ejection fraction, by estimation, is 50 to 55%. The left  ventricle has low normal function. The left ventricle demonstrates  regional wall motion abnormalities (see scoring  diagram/findings for description). There is mild concentric left  ventricular hypertrophy. Left ventricular diastolic parameters are  consistent with Grade I diastolic dysfunction (impaired relaxation).  Elevated left ventricular end-diastolic pressure.  2. Right ventricular systolic function is normal. The right ventricular  size is normal. There is normal pulmonary artery systolic pressure.  3. The mitral valve is normal in structure. No evidence of mitral valve  regurgitation. No evidence of mitral stenosis.  4.  The aortic valve is normal in structure. There is moderate  calcification of the aortic valve. There is moderate thickening of the  aortic valve. Aortic valve regurgitation is not visualized. Mild aortic  valve stenosis. Aortic valve area, by VTI  measures 1.02 cm. Aortic valve mean gradient measures 10.0 mmHg. Aortic  valve Vmax measures 2.22 m/s.  5. The inferior vena cava is normal in size with greater than 50%  respiratory variability, suggesting right atrial pressure of 3 mmHg.   Past Medical History:  Diagnosis Date  . Anxiety   . Broken arm Right  . Broken leg   . Complete heart block ([place])   . Hypertension   . Postural dizziness    Past Surgical History:  Procedure Laterality Date  . EP IMPLANTABLE DEVICE N/A 01/16/2015   MDT Adapta L PPM implanted by Dr Rayann Heman for complete heart block  . IR GENERIC HISTORICAL  09/15/2016   IR RADIOLOGIST EVAL & MGMT 09/15/2016 [facility]  . IR GENERIC HISTORICAL  09/28/2016   IR KYPHO THORACIC WITH BONE BIOPSY 09/28/2016 Luanne Bras, MD [facility]  . IR GENERIC HISTORICAL  10/14/2016   IR RADIOLOGIST EVAL & MGMT 10/14/2016 [facility]  . PACEMAKER INSERTION N/A 01/2015  No outpatient medications have been marked as taking for the 09/18/20 encounter (Appointment) with Deberah Pelton, NP.     Allergies:   Hctz [hydrochlorothiazide], Losartan, Macrolides and ketolides, Benazepril, and Penicillins   Social History   Tobacco Use  . Smoking status: Former Smoker    Years: 30.00    Types: Cigarettes    Quit date: 09/06/1986    Years since quitting: 34.0  . Smokeless tobacco: Never Used  Vaping Use  . Vaping Use: Never used  Substance Use Topics  . Alcohol use: Yes    Alcohol/week: 0.0 standard drinks    Comment: occasionally 1 monthly  . Drug use: No     Family Hx: The patient's family history includes Atrial fibrillation in her father; Heart failure in her mother; Hypertension in her father and mother.  ROS:    Please see the history of present illness.     All other systems reviewed and are negative.   Labs/Other Tests and Data Reviewed:    Recent Labs: 09/10/2020: BUN 16; Creatinine, Ser 0.87; Hemoglobin 14.5; Platelets 282; Potassium 4.2; Sodium 138   Recent Lipid Panel Lab Results  Component Value Date/Time   CHOL 136 01/16/2015 05:23 AM   TRIG 84 01/16/2015 05:23 AM   HDL 67 01/16/2015 05:23 AM   CHOLHDL 2.0 01/16/2015 05:23 AM   LDLCALC 52 01/16/2015 05:23 AM    Wt Readings from Last 3 Encounters:  09/10/20 169 lb (76.7 kg)  06/10/20 173 lb (78.5 kg)  05/21/20 170 lb (77.1 kg)     Exam:    Vital Signs:  There were no vitals taken for this visit.   Well nourished, well developed female in no  acute distress.   ASSESSMENT & PLAN:    1.  Dizziness- symptoms have been better this week.  Still noticing subtle dizziness in the morning when she gets up.  Blood pressures 355D-322G systolic, after taking medication.  This seems to be related to elevated blood pressure versus dehydration. Continue amlodipine and amlodipine as needed Heart healthy diet Aspirin support stockings she given Continue blood pressure log- take blood pressure in the morning, and in the evening, and with symptoms.  Essential hypertension-BP today 159/83. Had not taken medication yet this morning. Notes that her blood pressure is elevated in the 170s prior to medication in the morning.  Reports taking amlodipine as prescribed.  Had recent visit to emergency department with 2 complaints of presyncope.  Blood pressure normalized, work-up unremarkable. Continue amlodipine to 5 mg and 2.5mg  prn Heart healthy low-sodium diet Increase physical activity as tolerated Continue blood pressure log  Cardiac murmur- echocardiogram showed mild aortic stenosis on 07/08/2020.  No increased DOE or activity intolerance. Heart healthy low-sodium diet-salty 6 given Increase physical activity as tolerated Repeat  echocardiogram as clinically indicated  Complete heart block status post PPM- interrogated 08/27/2020 and showed normal device function, stable battery status, leads stable, histograms appropriate Follows with EP and needs follow-up visit  Disposition: Follow-up Dr. Percival Spanish or me in 1 month.  COVID-19 Education: The signs and symptoms of COVID-19 were discussed with the patient and how to seek care for testing (follow up with PCP or arrange E-visit).  The importance of social distancing was discussed today.  Patient Risk:   After full review of this patients clinical status, I feel that they are at least moderate risk at this time.  Time:   Today, I have spent 26 minutes with the patient with telehealth technology discussing hydration, labs,  medication, physical activity.  I spent greater than 20 minutes reviewing the patient's past medical history, previous cardiac tests/labs, and medications.   Medication Adjustments/Labs and Tests Ordered: Current medicines are reviewed at length with the patient today.  Concerns regarding medicines are outlined above.   Tests Ordered: No orders of the defined types were placed in this encounter.  Medication Changes: No orders of the defined types were placed in this encounter.   Disposition:  in 1 month(s)  Signed, Jossie Ng. Oluwatomisin Hustead NP-C    04/10/2019 11:58 AM    [address] Office [phone] Fax [phone]

## 2020-09-18 ENCOUNTER — Telehealth (INDEPENDENT_AMBULATORY_CARE_PROVIDER_SITE_OTHER): Payer: Medicare PPO | Admitting: General Practice

## 2020-09-18 ENCOUNTER — Telehealth: Payer: Self-pay | Admitting: Cardiovascular Disease

## 2020-09-18 ENCOUNTER — Encounter: Payer: Self-pay | Admitting: General Practice

## 2020-09-18 VITALS — BP 159/83 | HR 75 | Wt 169.0 lb

## 2020-09-18 DIAGNOSIS — R011 Cardiac murmur, unspecified: Secondary | ICD-10-CM

## 2020-09-18 DIAGNOSIS — I10 Essential (primary) hypertension: Secondary | ICD-10-CM | POA: Diagnosis not present

## 2020-09-18 DIAGNOSIS — Z950 Presence of cardiac pacemaker: Secondary | ICD-10-CM | POA: Diagnosis not present

## 2020-09-18 DIAGNOSIS — I442 Atrioventricular block, complete: Secondary | ICD-10-CM

## 2020-09-18 NOTE — Patient Instructions (Addendum)
Medication Instructions:  MAY TAKE EXTRA 1/2 TAB AMLODIPINE 2.5MG  AS NEEDED FOR INCREASED BLOOD PRESSURE.  DO NOT TAKE XANAX AND EXTRA AMLODIPINE TOGETHER. *If you need a refill on your cardiac medications before your next appointment, please call your pharmacy*  Lab Work:   Testing/Procedures:  NONE    NONE  Special Instructions INCREASE HYDRATION  PLEASE READ AND FOLLOW SALTY 6-ATTACHED-1,800mg  daily  PLEASE PURCHASE AND WEAR COMPRESSION STOCKINGS DAILY AND TAKE OFF AT BEDTIME. Compression stockings are elastic socks that squeeze the legs. They help to increase blood flow to the legs and to decrease swelling in the legs from fluid retention, and reduce the chance of developing blood clots in the lower legs. Please put on in the AM when dressing and off at night when dressing for bed. [employer]  [place] ([place] [phone]); [place], [place] [address]; [phone]  EMAIL   [email or web address] LET THEM KNOW THAT YOU NEED KNEE HIGH'S WITH COMPRESSION OF 15-20 mmhg.  PLEASE MAKE SURE TO ELEVATE YOUR FEET & LEGS WHILE SITTING, THIS WILL HELP WITH THE SWELLING ALSO.   Follow-Up: Your next appointment:  1 month(s) EITHER IN PERSON OR VIRTUAL with [name], MD OR IF UNAVAILABLE [place], FNP-C   At [facility], you and your health needs are our priority.  As part of our continuing mission to provide you with exceptional heart care, we have created designated Provider Care Teams.  These Care Teams include your primary Cardiologist (physician) and Advanced Practice Providers (APPs -  Physician Assistants and Nurse Practitioners) who all work together to provide you with the care you need, when you need it.  We recommend signing up for the patient portal called "MyChart".  Sign up information is provided on this After Visit Summary.  MyChart is used to connect with patients for Virtual Visits (Telemedicine).  Patients are able to view lab/test results, encounter  notes, upcoming appointments, etc.  Non-urgent messages can be sent to your provider as well.   To learn more about what you can do with MyChart, go to [email or web address].              6 SALTY THINGS TO AVOID     1,800MG  DAILY

## 2020-09-18 NOTE — Telephone Encounter (Signed)
Did not need this encounter °

## 2020-09-24 ENCOUNTER — Telehealth: Payer: Self-pay | Admitting: Cardiology

## 2020-09-24 NOTE — Telephone Encounter (Signed)
I agree that she just needs to keep a BP diary.  I don't see that she needs a change in meds.

## 2020-09-24 NOTE — Telephone Encounter (Signed)
Pt c/o medication issue:  1. Name of Medication: amLODipine (NORVASC) 5 MG tablet  2. How are you currently taking this medication (dosage and times per day)? As directed, had to take an extra 0.5 tablet earlier this week  3. Are you having a reaction (difficulty breathing--STAT)? no  4. What is your medication issue? Patient's BP is elevated. She wants to know if there should be changes made to it, she was advised by EMT who came out to her house today to call Dr. Percival Spanish. She states that her BP was 173/??. She said that earlier her BP was 200/?? But the EMT said her cuff was too big so that may have been the issue there. She is in the process of getting a new cuff. She said she went to bend down today and about fell over and that's when she knew something wasn't right. She wants to know if this medication needs to be adjusted.

## 2020-09-24 NOTE — Telephone Encounter (Signed)
Spoke with patient. Patient went upstairs to rearrange a few items and bent over. She then felt this awful feeling like her head was too big and just terrible all over. She sat down and called 911. EMS arrived, performed EKG and BP. EKG was okay per patient. BP was 200/??. She was told her blood pressure cuff was too large. She was advised to go to the ER but refused. EMS instructed her to take 1/2 of her Xanax and call Dr. Percival Spanish.   1/14 - 134/82, 67  134/79, 67 1/15 - 140/85   138/74, 69 1/16 - 146/84    154/91 (took 1/2 amlodipine)  122/77 1/17 - 152/85    137/81     110/70 1/18 - 154/86   134/89    93/57   114/66   138/78, 72 1/19 - 120/73, 70 at 11am  Blood pressure 165/76, HR 75 at time of call. Does not want to take additional amlodipine at this time because she took Xanax about 40 minutes ago.   Will route to Dr. Percival Spanish and pharmacists for review.

## 2020-09-24 NOTE — Telephone Encounter (Signed)
Returned call to patient - she states that EMS suspected she had a panic attack this morning that caused her pressure to rise.  Patient wasn't sure this is correct, because there was no specific trigger.  Explained that they can occur without.  They had her take 1/2 of alprazolam 0.25 mg.  They also noted that her home cuff was too large an suggested a smaller size.  She has already ordered one from [facility], will arrive tomorrow.   Patient states she has had these "spells" on occasion since [place] early [place].  At neither visit was anyone able to find any problems with her heart or pacemaker.  She has not gone to ED for any further spells, as she doesn't think there is anything they can do.    Advised patient to continue with twice daily home BP monitoring, but not to start until new cuff arrives.  She should call back in a week and let us know what her readings are.  At that time we can determine if she needs further BP management.   She should continue with amlodipine for now.    [place] to [place] Pedialyte regularly, but I asked that she limit that due to sodium content.

## 2020-10-01 NOTE — Telephone Encounter (Signed)
Not sure if this got back to you

## 2020-10-02 ENCOUNTER — Telehealth: Payer: Self-pay | Admitting: Pharmacist Clinician (PhC)/ Clinical Pharmacy Specialist

## 2020-10-02 NOTE — Telephone Encounter (Signed)
Patient called to report that she saw her PCP yesterday and he has now prescribed her sertaline to help with the possible panic/anxiety issues.  She notes that her home BP readings are mostly normal, running 482-500 systolic over the past week.  Did have one episode of panic/anxiety and systolic was at 370.  Later that same day she was feeling weak and checked again - 90/58.  Reviewed with her the importance of giving the sertraline a good month trial before making any decisions about if it works or not.  In the meantime, she can continue to use her alprazolam for any spells that she has.    She also states that Dr. Inda Merlin told her she may need to cut her amlodipine in half and take 2.5 mg twice daily.  Suggested she try that dose for a few weeks and see how her BP does.  She will continue to use the alprazolam and let us know in a few weeks how she is doing.

## 2020-10-08 ENCOUNTER — Encounter (INDEPENDENT_AMBULATORY_CARE_PROVIDER_SITE_OTHER): Payer: Medicare PPO | Admitting: Ophthalmology

## 2020-10-08 ENCOUNTER — Other Ambulatory Visit: Payer: Self-pay

## 2020-10-08 DIAGNOSIS — H35033 Hypertensive retinopathy, bilateral: Secondary | ICD-10-CM | POA: Diagnosis not present

## 2020-10-08 DIAGNOSIS — H353231 Exudative age-related macular degeneration, bilateral, with active choroidal neovascularization: Secondary | ICD-10-CM | POA: Diagnosis not present

## 2020-10-08 DIAGNOSIS — I10 Essential (primary) hypertension: Secondary | ICD-10-CM | POA: Diagnosis not present

## 2020-10-08 DIAGNOSIS — H43813 Vitreous degeneration, bilateral: Secondary | ICD-10-CM

## 2020-10-08 DIAGNOSIS — D3131 Benign neoplasm of right choroid: Secondary | ICD-10-CM | POA: Diagnosis not present

## 2020-10-13 ENCOUNTER — Telehealth: Payer: Self-pay | Admitting: Pharmacist Clinician (PhC)/ Clinical Pharmacy Specialist

## 2020-10-13 MED ORDER — VALSARTAN 80 MG PO TABS: 80.0000 mg | ORAL_TABLET | Freq: Every day | ORAL | 5 refills | Status: DC

## 2020-10-13 NOTE — Telephone Encounter (Signed)
Patient called to report BP remains elevated 536-468'E systolic and 32'Z diastolic most days. Continues to have anxiety issues and now believes the amlodipine could be the cause.   (Up to date lists as < 1% incidence).    Agreed to stop amlodipine and try valsartan 80 mg once daily.  Patient worried about getting too much medication and causing low readings.   States she is overly sensitive to meds.  Explained that this is a low dose and she should be fine.  She is seeing Dr. Rayann Heman on Monday at the [facility] office and is also scheduled for virtual appointment with Coletta Memos on the same day.  Will speak with his RN about moving [place] appointment out another week, then she can come in person and get BMET at same time.  Patient agreeable to plan.

## 2020-10-15 ENCOUNTER — Telehealth: Payer: Self-pay | Admitting: Pharmacist

## 2020-10-15 NOTE — Telephone Encounter (Signed)
Patient is calling to report elevated BP during the night - BP 179/100 at 4am, She took 1/2 xanax and will take 1st valsartan dose this morning.  Instructed to take valsartan 80mg  with her berakfast and try to get more sleep. Goal is to sleep 6-7 hours per day. Okay to nap if needed.  Questions related to new medication answered. Patient expressed understanding and will call back if needed.

## 2020-10-20 ENCOUNTER — Other Ambulatory Visit: Payer: Self-pay

## 2020-10-20 ENCOUNTER — Ambulatory Visit: Payer: Medicare PPO | Admitting: Internal Medicine

## 2020-10-20 ENCOUNTER — Telehealth: Payer: Medicare PPO | Admitting: General Practice

## 2020-10-20 ENCOUNTER — Encounter: Payer: Self-pay | Admitting: Internal Medicine

## 2020-10-20 VITALS — BP 138/84 | HR 83 | Ht 64.0 in | Wt 168.4 lb

## 2020-10-20 DIAGNOSIS — I442 Atrioventricular block, complete: Secondary | ICD-10-CM

## 2020-10-20 DIAGNOSIS — I10 Essential (primary) hypertension: Secondary | ICD-10-CM

## 2020-10-20 NOTE — Progress Notes (Signed)
PCP: [name], MD Primary Cardiologist: Dr Percival Spanish Primary EP:  Dr Rayann Heman  [name] [name] is a 85 y.o. female who presents today for routine electrophysiology followup.  Since last being seen in our clinic, the patient reports doing reasonably well.  She has had some recent dizziness/ presyncope.  These episodes are mostly postural.  She has not passed out or had injury.   Today, she denies symptoms of palpitations, chest pain, shortness of breath,  lower extremity edema.  The patient is otherwise without complaint today.   Past Medical History:  Diagnosis Date  . Anxiety   . Broken arm Right  . Broken leg   . Complete heart block ([place])   . Hypertension   . Postural dizziness    Past Surgical History:  Procedure Laterality Date  . EP IMPLANTABLE DEVICE N/A 01/16/2015   MDT Adapta L PPM implanted by Dr Rayann Heman for complete heart block  . IR GENERIC HISTORICAL  09/15/2016   IR RADIOLOGIST EVAL & MGMT 09/15/2016 [facility]  . IR GENERIC HISTORICAL  09/28/2016   IR KYPHO THORACIC WITH BONE BIOPSY 09/28/2016 Luanne Bras, MD [facility]  . IR GENERIC HISTORICAL  10/14/2016   IR RADIOLOGIST EVAL & MGMT 10/14/2016 [facility]  . PACEMAKER INSERTION N/A 01/2015    ROS- all systems are reviewed and negative except as per HPI above  Current Outpatient Medications  Medication Sig Dispense Refill  . acetaminophen (TYLENOL) 500 MG tablet Take 500 mg by mouth every 6 (six) hours as needed for mild pain.    Marland Kitchen ALPRAZolam (XANAX) 0.25 MG tablet Take 0.125 tablets by mouth daily as needed for anxiety.     Marland Kitchen aspirin 325 MG tablet Take 650 mg by mouth at bedtime as needed for moderate pain. Will take at bedtime if needed for pain    . aspirin 81 MG tablet Take 81 mg by mouth daily.    Marland Kitchen Besifloxacin HCl 0.6 % SUSP Apply 1 drop to eye 4 (four) times daily. For 2 days after injection    . calcium carbonate (TUMS EX) 750 MG chewable tablet Chew 1 tablet by mouth 2 (two) times daily as  needed.     Marland Kitchen CALCIUM PO Take 600 mg by mouth daily.     . Cholecalciferol (VITAMIN D3) 2000 units TABS Take 1 tablet by mouth daily.    . famotidine (PEPCID) 20 MG tablet Take 10-20 mg by mouth daily.     . fluticasone (FLONASE) 50 MCG/ACT nasal spray Place 2 sprays into both nostrils daily as needed for allergies.     Marland Kitchen lidocaine (LIDODERM) 5 % Place 1 patch onto the skin daily as needed. Remove & Discard patch within 12 hours or as directed by MD    . Magnesium Hydroxide (PHILLIPS MILK OF MAGNESIA PO) Take 1-2 tablets by mouth 2 (two) times daily as needed.    . meloxicam (MOBIC) 7.5 MG tablet Take one tablet daily. 30 tablet 0  . Multiple Vitamins-Minerals (ICAPS MV PO) Take 1 tablet by mouth daily.    Marland Kitchen PRESCRIPTION MEDICATION Place into both eyes every 30 (thirty) days. Avastin injection in right eye, Ilea in left eye every 4 weeks by Dr Zigmund Daniel    . valsartan (DIOVAN) 80 MG tablet Take 1 tablet (80 mg total) by mouth daily. 30 tablet 5   No current facility-administered medications for this visit.    Physical Exam: Vitals:   10/20/20 1533  BP: 138/84  Pulse: 83  SpO2:  96%  Weight: 168 lb 6.4 oz (76.4 kg)  Height: 5\' 4"  (1.626 m)    GEN- The patient is well appearing, alert and oriented x 3 today.   Head- normocephalic, atraumatic Eyes-  Sclera clear, conjunctiva pink Ears- hearing intact Oropharynx- clear Lungs- Clear to ausculation bilaterally, normal work of breathing Chest- pacemaker pocket is well healed Heart- Regular rate and rhythm, no murmurs, rubs or gallops, PMI not laterally displaced GI- soft, NT, ND, + BS Extremities- no clubbing, cyanosis, or edema  Pacemaker interrogation- reviewed in detail today,  See PACEART report  ekg tracing ordered today is personally reviewed and shows sinus with V pacing  Assessment and Plan:  1. Symptomatic complete heart block Normal pacemaker function See Pace Art report Nothing on PPM to explain her dizziness I have  reduced RV lead sensivity from 2.4 to 4 mV as she is device dependant today  2. HTN Stable No change required today   Risks, benefits and potential toxicities for medications prescribed and/or refilled reviewed with patient today.   Return to see EP PA in a year  [name] MD, [facility] 10/20/2020 3:53 PM

## 2020-10-20 NOTE — Patient Instructions (Signed)
Medication Instructions:  Your physician recommends that you continue on your current medications as directed. Please refer to the Current Medication list given to you today.  Labwork: None ordered.  Testing/Procedures: None ordered.  Follow-Up: Your physician wants you to follow-up in: one year with     Renee Ursuy, PA-C     You will receive a reminder letter in the mail two months in advance. If you don't receive a letter, please call our office to schedule the follow-up appointment.  Any Other Special Instructions Will Be Listed Below (If Applicable).  If you need a refill on your cardiac medications before your next appointment, please call your pharmacy.         

## 2020-10-23 ENCOUNTER — Telehealth: Payer: Self-pay | Admitting: Internal Medicine

## 2020-10-23 NOTE — Telephone Encounter (Addendum)
Spoke with pt.  She had OV on 2/14, she is wondering if the changes made in her PM could be causing the way she is feeling.  She reports feeling out of breath with any activity.  At times she feels lightheaded.  She did not have these feelings prior to her OV on 2/14.  Requested pt send a manual transmission from device.  She stated she will do that as soon as she can after we get off the phone.    Advised pt due to time of day we likely will not recived transmission until tomorrow morning, pt ok with phone call back tomorrow.

## 2020-10-23 NOTE — Telephone Encounter (Signed)
Pt c/o Shortness Of Breath: STAT if SOB developed within the last 24 hours or pt is noticeably SOB on the phone  1. Are you currently SOB (can you hear that pt is SOB on the phone)? A little bit right now  2. How long have you been experiencing SOB? Since 10/20/20  3. Are you SOB when sitting or when up moving around? Moving around   4. Are you currently experiencing any other symptoms? Feels a little "whoozy" when the SOB occurs.   [name] [name] states she has noticed a little SOB when active around the house since changes were made to her pacemaker. She states when the SOB occurs she also feels a little "whoozy", but not as if she is going to pass out. She states the SOB is not severe and she is not gasping for air, but it is a noticeable change. Please advise.

## 2020-10-24 ENCOUNTER — Encounter: Payer: Medicare PPO | Admitting: Internal Medicine

## 2020-10-24 ENCOUNTER — Other Ambulatory Visit: Payer: Self-pay

## 2020-10-24 ENCOUNTER — Telehealth: Payer: Self-pay | Admitting: Pharmacist

## 2020-10-24 ENCOUNTER — Ambulatory Visit (INDEPENDENT_AMBULATORY_CARE_PROVIDER_SITE_OTHER): Payer: Medicare PPO | Admitting: Emergency Medicine

## 2020-10-24 DIAGNOSIS — I442 Atrioventricular block, complete: Secondary | ICD-10-CM

## 2020-10-24 LAB — CUP PACEART INCLINIC DEVICE CHECK
Battery Impedance: 466 Ohm
Battery Remaining Longevity: 91 mo
Battery Voltage: 2.78 V
Brady Statistic AP VP Percent: 16 %
Brady Statistic AP VS Percent: 0 %
Brady Statistic AS VP Percent: 84 %
Brady Statistic AS VS Percent: 0 %
Date Time Interrogation Session: 20220218115326
Implantable Lead Implant Date: 20160512
Implantable Lead Implant Date: 20160512
Implantable Lead Location: 753859
Implantable Lead Location: 753860
Implantable Lead Model: 5076
Implantable Lead Model: 5092
Implantable Pulse Generator Implant Date: 20160512
Lead Channel Impedance Value: 470 Ohm
Lead Channel Impedance Value: 775 Ohm
Lead Channel Pacing Threshold Amplitude: 0.75 V
Lead Channel Pacing Threshold Amplitude: 0.75 V
Lead Channel Pacing Threshold Pulse Width: 0.4 ms
Lead Channel Pacing Threshold Pulse Width: 0.4 ms
Lead Channel Sensing Intrinsic Amplitude: 1.4 mV
Lead Channel Setting Pacing Amplitude: 2 V
Lead Channel Setting Pacing Amplitude: 2.5 V
Lead Channel Setting Pacing Pulse Width: 0.4 ms
Lead Channel Setting Sensing Sensitivity: 4 mV

## 2020-10-24 MED ORDER — VALSARTAN 80 MG PO TABS: 40.0000 mg | ORAL_TABLET | Freq: Every day | ORAL | 5 refills | Status: DC

## 2020-10-24 NOTE — Telephone Encounter (Signed)
Normal device function. Will bring patient in with [employer] present to see if we are able to make any changes. Patient advised not to drive. ED precautions discussed.

## 2020-10-24 NOTE — Telephone Encounter (Signed)
Patient reports some dizzy spells since pacemaker voltage adjusted on Monday.  BP readings: 10/23/20: 136/85 (71bpm) before medication               124/77 (77)                100/63 (62) evening (dizzy)  10/24/20: 141/91 (71) before medication                132/76 (80)  She is pleased with valsartan results and stopped having severe spikes in BP, but having some dizziness and afraid of SBP of 100.  Will decrease valsartan to 40mg  daily. Keep monitoring BP twice daily, and bring records to follow up appointment with Coletta Memos on Tuesday Feb22

## 2020-10-24 NOTE — Progress Notes (Signed)
Error. Patient was moved to Dr. Rayann Heman scheduled.

## 2020-10-27 NOTE — Progress Notes (Signed)
Cardiology Clinic Note   Patient Name: [name] [name] Date of Encounter: 10/28/2020  Primary Care Provider:  [name], MD Primary Cardiologist:  [name], MD  Patient Profile    [name] [name] is a 85 y.o. female who presents to the clinic today for follow-up evaluation of her essential hypertension.  Past Medical History    Past Medical History:  Diagnosis Date  . Anxiety   . Broken arm Right  . Broken leg   . Complete heart block ([place])   . Hypertension   . Postural dizziness    Past Surgical History:  Procedure Laterality Date  . EP IMPLANTABLE DEVICE N/A 01/16/2015   MDT Adapta L PPM implanted by Dr Rayann Heman for complete heart block  . IR GENERIC HISTORICAL  09/15/2016   IR RADIOLOGIST EVAL & MGMT 09/15/2016 [facility]  . IR GENERIC HISTORICAL  09/28/2016   IR KYPHO THORACIC WITH BONE BIOPSY 09/28/2016 Luanne Bras, MD [facility]  . IR GENERIC HISTORICAL  10/14/2016   IR RADIOLOGIST EVAL & MGMT 10/14/2016 [facility]  . PACEMAKER INSERTION N/A 01/2015    Allergies  Allergies  Allergen Reactions  . Hctz [Hydrochlorothiazide] Shortness Of Breath and Other (See Comments)    Very short winded, made her hurt all over   . Losartan Other (See Comments)    Skin crawling feelings  . Macrolides And Ketolides Other (See Comments)    Pyloric sphincter flareups-unsure of which -mycins  . Benazepril Other (See Comments)    Doesn't work for patient  . Penicillins Other (See Comments)    abd pain Has patient had a PCN reaction causing immediate rash, facial/tongue/throat swelling, SOB or lightheadedness with hypotension: No Has patient had a PCN reaction causing severe rash involving mucus membranes or skin necrosis: No Has patient had a PCN reaction that required hospitalization No Has patient had a PCN reaction occurring within the last 10 years: No If all of the above answers are "NO", then may proceed with Cephalosporin use.      History of Present  Illness    [name] [name] has a PMH of hypertension, second-degree Mobitz type II, CHB status post PPM 5/16, cardiac murmur  She was last seen by Dr. Percival Spanish on 06/10/2020.  During that time she reported that she had injured her back while digging her flowers.  She had recovered but reported that it took several weeks for her to feel better.  She was walking with her cane.  She had done well from a cardiovascular standpoint.  She reported that she would get short of breath with climbing stairs but denied increased work of breathing at rest.  She denied palpitations, presyncope, syncope, and weight gain or lower extremity swelling.  She was overdue for her EP follow-up at that time and it was recommended that she schedule follow-up appointment.  Her blood pressure was mildly elevated and she was instructed to keep a blood pressure log.  Her echocardiogram was repeated 07/08/2020 showed an EF of 50- 55%, G1 DD mild aortic stenosis.  She presented to the emergency department on 09/11/2020.  She reported that she had a near syncopal episode at home when she woke up.  Her symptoms went away and then returned.  Her blood pressure at that time was 168/110, her EKG showed a paced rhythm at 83 bpm.  She reported that she had episodes like this in the past when she was at the beach.  There was an extensive work-up performed while she was at  the hospital there which did not find a cause.  She reported that she does suffer from anxiety and that she takes her Xanax and feels better.  While in the emergency department she denied chest pain.  She did report that she had injections into her eyes the day prior to her 1/22 ED visit.  Her blood pressure normalized.  Her pacemaker was interrogated but did not show any arrhythmias during the episode.  She was discharged in stable condition.  She was seen virtually 09/18/2020 and stated she had continued to have some sensation of flushing/lightheadedness over the last week.  She  noticed that this was mainly in the morning when she would get up.  She would take her blood pressure and notes that his elevated in the mornings.  After she took her medication it would come down into the [id number] systolic over 30Q/65H diastolic.  She had also noted some lower blood pressures in the 110s over 70s.  She reported that her symptoms that week were not as severe as they had been last week when she went to the emergency department.  I  instructed her to start wearing lower extremity support stockings, increase her p.o. hydration, and follow-up with electrophysiology.  I  continued her amlodipine 5 mg and 2.5 mg as needed.   Follow-up was planned for 1 month.  She was seen by Dr. Rayann Heman 10/20/2020. She reported she was doing reasonably well. She did report some recent dizziness/presyncope. It was felt these episodes were postural. She denied palpitations, chest pain, shortness of breath, lower extremity edema. Her pacemaker was functioning normally. No arrhythmias noted on PPM reviewed. RV sensitivity lead was reduced from 2.4-48mV because she is device dependent. Her blood pressure was stable at that time. Follow-up was planned for 1 year.  She presents the clinic today for follow-up evaluation states she has been managing better with her reduced dose of valsartan.  She contacted Dr. Jackalyn Lombard office again on 10/24/2020.  Her pacemaker was reinterrogated and showed no arrhythmias.  She feels that her blood pressure is much better controlled on her current therapy.  She brings her blood pressure log today which shows blood pressures ranging in the 120s-130s over 70s and 80s.  I will repeat a BMP today, have instructed her to maintain her p.o. hydration, and will have her follow-up with Dr. Percival Spanish in 3 months.  Today she denies chest pain, shortness of breath, lower extremity edema, fatigue, palpitations, melena, hematuria, hemoptysis, diaphoresis, weakness, orthopnea, and PND.  Home Medications     Prior to Admission medications   Medication Sig Start Date End Date Taking? Authorizing Provider  acetaminophen (TYLENOL) 500 MG tablet Take 500 mg by mouth every 6 (six) hours as needed for mild pain.    [provider]  ALPRAZolam Duanne Moron) 0.25 MG tablet Take 0.125 tablets by mouth daily as needed for anxiety.  02/04/15   [provider]  aspirin 325 MG tablet Take 650 mg by mouth at bedtime as needed for moderate pain. Will take at bedtime if needed for pain    [provider]  aspirin 81 MG tablet Take 81 mg by mouth daily.    [provider]  Besifloxacin HCl 0.6 % SUSP Apply 1 drop to eye 4 (four) times daily. For 2 days after injection    [provider]  calcium carbonate (TUMS EX) 750 MG chewable tablet Chew 1 tablet by mouth 2 (two) times daily as needed.     [provider]  CALCIUM PO Take 600 mg by mouth daily.     [provider]  Cholecalciferol (VITAMIN D3) 2000 units TABS Take 1 tablet by mouth daily.    [provider]  famotidine (PEPCID) 20 MG tablet Take 10-20 mg by mouth daily.     [provider]  fluticasone (FLONASE) 50 MCG/ACT nasal spray Place 2 sprays into both nostrils daily as needed for allergies.  12/27/14   [provider]  lidocaine (LIDODERM) 5 % Place 1 patch onto the skin daily as needed. Remove & Discard patch within 12 hours or as directed by MD    [provider]  Magnesium Hydroxide (PHILLIPS MILK OF MAGNESIA PO) Take 1-2 tablets by mouth 2 (two) times daily as needed.    [provider]  meloxicam (MOBIC) 7.5 MG tablet Take one tablet daily. 05/23/20   Garald Balding, MD  Multiple Vitamins-Minerals (ICAPS MV PO) Take 1 tablet by mouth daily.    [provider]  PRESCRIPTION MEDICATION Place into both eyes every 30 (thirty) days. Avastin injection in right eye, Ilea in left eye every 4 weeks by Dr Zigmund Daniel    [provider]   valsartan (DIOVAN) 80 MG tablet Take 0.5 tablets (40 mg total) by mouth daily. 10/24/20   [name], MD    Family History    Family History  Problem Relation Age of Onset  . Heart failure Mother   . Hypertension Mother   . Hypertension Father   . Atrial fibrillation Father    She indicated that her mother is deceased. She indicated that her father is deceased. She indicated that only one of her three sisters is alive. She indicated that her brother is alive. She indicated that her maternal grandmother is deceased. She indicated that her maternal grandfather is deceased. She indicated that her paternal grandmother is deceased. She indicated that her paternal grandfather is deceased.  Social History    Social History   Socioeconomic History  . Marital status: Married    Spouse name: Not on file  . Number of children: 2  . Years of education: Not on file  . Highest education level: Not on file  Occupational History  . Not on file  Tobacco Use  . Smoking status: Former Smoker    Years: 30.00    Types: Cigarettes    Quit date: 09/06/1986    Years since quitting: 34.1  . Smokeless tobacco: Never Used  Vaping Use  . Vaping Use: Never used  Substance and Sexual Activity  . Alcohol use: Yes    Alcohol/week: 0.0 standard drinks    Comment: occasionally 1 monthly  . Drug use: No  . Sexual activity: Not on file  Other Topics Concern  . Not on file  Social History Narrative   Lives with Deidre Ala her husband   Social Determinants of Radio broadcast assistant Strain: Not on file  Food Insecurity: Not on file  Transportation Needs: Not on file  Physical Activity: Not on file  Stress: Not on file  Social Connections: Not on file  Intimate Partner Violence: Not on file     Review of Systems    General:  No chills, fever, night sweats or weight changes.  Cardiovascular:  No chest pain, dyspnea on exertion, edema, orthopnea, palpitations, paroxysmal nocturnal  dyspnea. Dermatological: No rash, lesions/masses Respiratory: No cough, dyspnea Urologic: No hematuria, dysuria Abdominal:   No nausea, vomiting, diarrhea, bright red blood per rectum, melena, or  hematemesis Neurologic:  No visual changes, wkns, changes in mental status. All other systems reviewed and are otherwise negative except as noted above.  Physical Exam    VS:  BP 136/82   Pulse 80   Ht 5\' 4"  (1.626 m)   Wt 168 lb (76.2 kg)   SpO2 95%   BMI 28.84 kg/m  , BMI Body mass index is 28.84 kg/m. GEN: Well nourished, well developed, in no acute distress. HEENT: normal. Neck: Supple, no JVD, carotid bruits, or masses. Cardiac: RRR, no murmurs, rubs, or gallops. No clubbing, cyanosis, edema.  Radials/DP/PT 2+ and equal bilaterally.  Respiratory:  Respirations regular and unlabored, clear to auscultation bilaterally. GI: Soft, nontender, nondistended, BS + x 4. MS: no deformity or atrophy. Skin: warm and dry, no rash. Neuro:  Strength and sensation are intact. Psych: Normal affect.  Accessory Clinical Findings    Recent Labs: 09/10/2020: BUN 16; Creatinine, Ser 0.87; Hemoglobin 14.5; Platelets 282; Potassium 4.2; Sodium 138   Recent Lipid Panel    Component Value Date/Time   CHOL 136 01/16/2015 0523   TRIG 84 01/16/2015 0523   HDL 67 01/16/2015 0523   CHOLHDL 2.0 01/16/2015 0523   VLDL 17 01/16/2015 0523   LDLCALC 52 01/16/2015 0523    ECG personally reviewed by me today-none today.  Echocardiogram 07/08/2020 IMPRESSIONS    1. Septal dyssynchrony consistent with ventricular pacing. Left  ventricular ejection fraction, by estimation, is 50 to 55%. The left  ventricle has low normal function. The left ventricle demonstrates  regional wall motion abnormalities (see scoring  diagram/findings for description). There is mild concentric left  ventricular hypertrophy. Left ventricular diastolic parameters are  consistent with Grade I diastolic dysfunction (impaired  relaxation).  Elevated left ventricular end-diastolic pressure.  2. Right ventricular systolic function is normal. The right ventricular  size is normal. There is normal pulmonary artery systolic pressure.  3. The mitral valve is normal in structure. No evidence of mitral valve  regurgitation. No evidence of mitral stenosis.  4. The aortic valve is normal in structure. There is moderate  calcification of the aortic valve. There is moderate thickening of the  aortic valve. Aortic valve regurgitation is not visualized. Mild aortic  valve stenosis. Aortic valve area, by VTI  measures 1.02 cm. Aortic valve mean gradient measures 10.0 mmHg. Aortic  valve Vmax measures 2.22 m/s.  5. The inferior vena cava is normal in size with greater than 50%  respiratory variability, suggesting right atrial pressure of 3 mmHg.   Assessment & Plan   1.  Essential hypertension-BP today 136/82.  Controlled at home with valsartan reduction to 40 mg daily.    Reports it has been fairly stable at home.   Heart healthy low-sodium diet Increase physical activity as tolerated Continue blood pressure log-instructed to take blood pressure 1 hour after taking medication daily Order BMP today  Cardiac murmur-no changes in respiration or activity tolerance. Echocardiogram showed mild aortic stenosis on 07/08/2020.  No increased DOE or activity intolerance. Heart healthy low-sodium diet-salty 6 given Increase physical activity as tolerated Repeat echocardiogram as clinically indicated  Complete heart block status post PPM-followed up with Dr. Rayann Heman 10/20/2020. Pacemaker interrogated and was normal at that time. No arrhythmias noted.  Contacted Dr. Jackalyn Lombard office again on 10/24/2020.  Pacemaker again interrogated and it did not show arrhythmias.  Follow-up plan for 1 year. Follows with EP  Dizziness- no recent episodes of presyncope or dizziness. Previously felt to be postural when evaluated by EP.  BP remained  stable. Continue to monitor.  Maintain p.o. hydration Continue amlodipine and amlodipine as needed Heart healthy diet Aspirin support stockings she given Continue blood pressure log- take blood pressure in the morning, and in the evening, and with symptoms.  Disposition: Follow-up Dr. Percival Spanish or me in 3 months.  Jossie Ng. Mirjana Tarleton NP-C    10/28/2020, 11:51 AM [address] Office [phone] Fax [phone]  Notice: This dictation was prepared with Dragon dictation along with smaller phrase technology. Any transcriptional errors that result from this process are unintentional and may not be corrected upon review.  I spent 15 minutes examining this patient, reviewing medications, and using patient centered shared decision making involving her cardiac care.  Prior to her visit I spent greater than 20 minutes reviewing her past medical history,  medications, and prior cardiac tests.

## 2020-10-28 ENCOUNTER — Encounter: Payer: Self-pay | Admitting: General Practice

## 2020-10-28 ENCOUNTER — Other Ambulatory Visit: Payer: Self-pay

## 2020-10-28 ENCOUNTER — Ambulatory Visit: Payer: Medicare PPO | Admitting: General Practice

## 2020-10-28 VITALS — BP 136/82 | HR 80 | Ht 64.0 in | Wt 168.0 lb

## 2020-10-28 DIAGNOSIS — R011 Cardiac murmur, unspecified: Secondary | ICD-10-CM

## 2020-10-28 DIAGNOSIS — I442 Atrioventricular block, complete: Secondary | ICD-10-CM

## 2020-10-28 DIAGNOSIS — I10 Essential (primary) hypertension: Secondary | ICD-10-CM | POA: Diagnosis not present

## 2020-10-28 DIAGNOSIS — R42 Dizziness and giddiness: Secondary | ICD-10-CM

## 2020-10-28 DIAGNOSIS — Z79899 Other long term (current) drug therapy: Secondary | ICD-10-CM

## 2020-10-28 NOTE — Patient Instructions (Signed)
Medication Instructions:  The current medical regimen is effective;  continue present plan and medications as directed. Please refer to the Current Medication list given to you today.  *If you need a refill on your cardiac medications before your next appointment, please call your pharmacy*  Lab Work:   Testing/Procedures:  NONE    NONE  Special Instructions PLEASE MAINTAIN PHYSICAL ACTIVITY AS TOLERATED  Follow-Up: Your next appointment:  3 month(s) In Person with [name], MD OR IF UNAVAILABLE [place], FNP-C  At [facility], you and your health needs are our priority.  As part of our continuing mission to provide you with exceptional heart care, we have created designated Provider Care Teams.  These Care Teams include your primary Cardiologist (physician) and Advanced Practice Providers (APPs -  Physician Assistants and Nurse Practitioners) who all work together to provide you with the care you need, when you need it.  We recommend signing up for the patient portal called "MyChart".  Sign up information is provided on this After Visit Summary.  MyChart is used to connect with patients for Virtual Visits (Telemedicine).  Patients are able to view lab/test results, encounter notes, upcoming appointments, etc.  Non-urgent messages can be sent to your provider as well.   To learn more about what you can do with MyChart, go to [email or web address].

## 2020-10-29 LAB — BASIC METABOLIC PANEL
BUN/Creatinine Ratio: 18 (ref 12–28)
BUN: 16 mg/dL (ref 8–27)
CO2: 22 mmol/L (ref 20–29)
Calcium: 10.6 mg/dL — ABNORMAL HIGH (ref 8.7–10.3)
Chloride: 101 mmol/L (ref 96–106)
Creatinine, Ser: 0.88 mg/dL (ref 0.57–1.00)
GFR calc Af Amer: 70 mL/min/{1.73_m2} (ref >59)
GFR calc non Af Amer: 61 mL/min/{1.73_m2} (ref >59)
Glucose: 80 mg/dL (ref 65–99)
Potassium: 4.8 mmol/L (ref 3.5–5.2)
Sodium: 141 mmol/L (ref 134–144)

## 2020-11-04 ENCOUNTER — Telehealth: Payer: Self-pay | Admitting: Cardiology

## 2020-11-04 NOTE — Telephone Encounter (Signed)
Pt c/o medication issue: valsartan (DIOVAN) 80 MG tablet [[id number]]   1. Name of Medication:   2. How are you currently taking this medication (dosage and times per day)? Take 0.5 tablets (40 mg total) by mouth daily.  3. Are you having a reaction (difficulty breathing--STAT)? Na   4. What is your medication issue? Pt stated that she having theses "spells" that her BP will go up high and she will take a Xanex it it fine but she does not know what to do when it goes way low.  She says that when it drops down low she feels like she is going to pass out . She would like to speak with nurse     Best number [phone]

## 2020-11-04 NOTE — Telephone Encounter (Signed)
Spoke with pt, her bp this morning was 148/90, she took 1/4 of the valsartan and then her bp was 134/81. Then at lunch she had a spell and her bp was 119/75, she reports when it gets to that level it makes her feel like she is going to pass out and die. She reports she takes the valsartan in the morning and usually the rest of the day she will be fine but sometimes at night her bp will get down to 96/52 or 85/60 and then she really feels bad. She will try taking the valsartan in the evenings to help with the elevated bp when she gets up and hopefully it will keep her bp from dropping during the day. She will try this and let us know how it works for her.

## 2020-11-05 ENCOUNTER — Other Ambulatory Visit: Payer: Self-pay

## 2020-11-05 ENCOUNTER — Encounter (INDEPENDENT_AMBULATORY_CARE_PROVIDER_SITE_OTHER): Payer: Medicare PPO | Admitting: Ophthalmology

## 2020-11-05 DIAGNOSIS — H35033 Hypertensive retinopathy, bilateral: Secondary | ICD-10-CM

## 2020-11-05 DIAGNOSIS — D3131 Benign neoplasm of right choroid: Secondary | ICD-10-CM | POA: Diagnosis not present

## 2020-11-05 DIAGNOSIS — H43813 Vitreous degeneration, bilateral: Secondary | ICD-10-CM

## 2020-11-05 DIAGNOSIS — I10 Essential (primary) hypertension: Secondary | ICD-10-CM | POA: Diagnosis not present

## 2020-11-05 DIAGNOSIS — H353231 Exudative age-related macular degeneration, bilateral, with active choroidal neovascularization: Secondary | ICD-10-CM

## 2020-11-11 NOTE — Telephone Encounter (Signed)
Pt c/o BP issue: STAT if pt c/o blurred vision, one-sided weakness or slurred speech  1. What are your last 5 BP readings?  145/95 69 135/90 113/69 145/85 138/82 98/67   2. Are you having any other symptoms (ex. Dizziness, headache, blurred vision, passed out)? Patient states she has been feeling faint on and off for several months (denies dizziness)  3. What is your BP issue?   Patient is following up regarding her BP fluctuation and the episode she had yeseterday. She requested a call back from Dr. Rosezella [place] nurse to discuss further.

## 2020-11-11 NOTE — Telephone Encounter (Signed)
Spoke with [name] [name] and continues to note wide range of B/P  readings throughout the day and has "woozy feeling"and also notes other episodes that are hard to describe "wave sensation" feels like going to fall over.Per [name] [name] had a good day Saturday but on Sunday B/P was 98/67  Thursday had a B/p of 82./59 and Friday afternoon 87/60 and at 9 pm got as high as 98/68.[name] [name] is adjusting dose of Valsartan based on what B/P reading is .Will forward to Dr Percival Spanish for review and recommendations./cy

## 2020-11-13 ENCOUNTER — Telehealth: Payer: Self-pay | Admitting: *Deleted

## 2020-11-13 NOTE — Telephone Encounter (Signed)
Spoke with pt, she has been taking a quarter of valsartan in the morning because she is too scared to take it in the evening because her bp is too low in the evening. She reports she had a spell today where she felt like she was going to pass out. Her bp was 144/84 and 144/88 with pulse 80-69 bpm. She is drinking plenty of water. Her bp is varying all over the place and she wants to know what to do. Will forward to pharm md to help.

## 2020-11-13 NOTE — Telephone Encounter (Signed)
EP thinks she is having postural hypotension issues.  See if she is using support stockings as was recommended.  Tell her she can just use the valsartan in the mornings - 1/2 of the 80 mg tablet if her BP is > 225 systolic.

## 2020-11-13 NOTE — Telephone Encounter (Signed)
Spoke with pt, aware of the pharmacist recommendations. She will purchase support stockings.

## 2020-11-14 NOTE — Telephone Encounter (Signed)
Looks like this was managed by Erasmo Downer and I agree with those recommendations.

## 2020-11-18 ENCOUNTER — Ambulatory Visit (INDEPENDENT_AMBULATORY_CARE_PROVIDER_SITE_OTHER): Payer: Medicare PPO

## 2020-11-18 DIAGNOSIS — I441 Atrioventricular block, second degree: Secondary | ICD-10-CM

## 2020-11-18 LAB — CUP PACEART REMOTE DEVICE CHECK
Battery Impedance: 514 Ohm
Battery Remaining Longevity: 89 mo
Battery Voltage: 2.78 V
Brady Statistic AP VP Percent: 18 %
Brady Statistic AP VS Percent: 0 %
Brady Statistic AS VP Percent: 82 %
Brady Statistic AS VS Percent: 0 %
Date Time Interrogation Session: 20220315113748
Implantable Lead Implant Date: 20160512
Implantable Lead Implant Date: 20160512
Implantable Lead Location: 753859
Implantable Lead Location: 753860
Implantable Lead Model: 5076
Implantable Lead Model: 5092
Implantable Pulse Generator Implant Date: 20160512
Lead Channel Impedance Value: 527 Ohm
Lead Channel Impedance Value: 914 Ohm
Lead Channel Pacing Threshold Amplitude: 0.75 V
Lead Channel Pacing Threshold Amplitude: 0.875 V
Lead Channel Pacing Threshold Pulse Width: 0.4 ms
Lead Channel Pacing Threshold Pulse Width: 0.4 ms
Lead Channel Setting Pacing Amplitude: 2 V
Lead Channel Setting Pacing Amplitude: 2.5 V
Lead Channel Setting Pacing Pulse Width: 0.4 ms
Lead Channel Setting Sensing Sensitivity: 4 mV

## 2020-11-25 DIAGNOSIS — G4700 Insomnia, unspecified: Secondary | ICD-10-CM | POA: Diagnosis not present

## 2020-11-25 DIAGNOSIS — F419 Anxiety disorder, unspecified: Secondary | ICD-10-CM | POA: Diagnosis not present

## 2020-11-25 DIAGNOSIS — R55 Syncope and collapse: Secondary | ICD-10-CM | POA: Diagnosis not present

## 2020-11-25 DIAGNOSIS — I10 Essential (primary) hypertension: Secondary | ICD-10-CM | POA: Diagnosis not present

## 2020-11-25 DIAGNOSIS — Z950 Presence of cardiac pacemaker: Secondary | ICD-10-CM | POA: Diagnosis not present

## 2020-11-25 DIAGNOSIS — E559 Vitamin D deficiency, unspecified: Secondary | ICD-10-CM | POA: Diagnosis not present

## 2020-11-26 NOTE — Progress Notes (Signed)
Remote pacemaker transmission.   

## 2020-11-28 ENCOUNTER — Telehealth: Payer: Self-pay | Admitting: Cardiology

## 2020-11-28 NOTE — Telephone Encounter (Signed)
Pt c/o BP issue: STAT if pt c/o blurred vision, one-sided weakness or slurred speech  1. What are your last 5 BP readings? 151/96 then 65/49  2. Are you having any other symptoms (ex. Dizziness, headache, blurred vision, passed out)? Denies these symptoms but states [name] [name] felt funny, something did not feel right.   3. What is your BP issue? Patient's BP was 151/96 this AM, [name] [name] took Valsartan as directed (0.5 tablet) and then her BP was 65/49. [name] [name] states that something just does not feel right and is not sure if it is the medication. [name] [name] states that now [name] [name] is feeling better. Her BP this week has been pretty good, averaging around 140/90. Today was the first day [name] [name] had taken Valsartan since Sunday. [name] [name] states [name] [name] only takes it when her BP gets above 150

## 2020-11-28 NOTE — Telephone Encounter (Signed)
Spoke with pt, she feels like the valsartan is not the right medication for her. She reports in the last 2 weeks she has taken the valsartan 5 times. If she takes the valsartan for the elevated bp by the end of the day her bp will be 92/63, 102/60 or 91/61 and she will feel bad. She has eaten some salt this afternoon and is drinking more fluids to help with her pressure this afternoon. She would like to know if dr hochrein could give her a different medication to use. Will forward to dr hochrein to review

## 2020-12-01 NOTE — Telephone Encounter (Signed)
I would suggest that she just hold the Diovan altogether and we send ups BPs 3x for day readings .  She can record these starting the day after she stops the meds and send me one week of recordings.

## 2020-12-01 NOTE — Telephone Encounter (Signed)
Advised patient, verbalized understanding  

## 2020-12-03 ENCOUNTER — Encounter (INDEPENDENT_AMBULATORY_CARE_PROVIDER_SITE_OTHER): Payer: Medicare PPO | Admitting: Ophthalmology

## 2020-12-03 ENCOUNTER — Other Ambulatory Visit: Payer: Self-pay

## 2020-12-03 DIAGNOSIS — D3131 Benign neoplasm of right choroid: Secondary | ICD-10-CM

## 2020-12-03 DIAGNOSIS — H353231 Exudative age-related macular degeneration, bilateral, with active choroidal neovascularization: Secondary | ICD-10-CM

## 2020-12-03 DIAGNOSIS — H35033 Hypertensive retinopathy, bilateral: Secondary | ICD-10-CM | POA: Diagnosis not present

## 2020-12-03 DIAGNOSIS — I10 Essential (primary) hypertension: Secondary | ICD-10-CM

## 2020-12-03 DIAGNOSIS — H43813 Vitreous degeneration, bilateral: Secondary | ICD-10-CM

## 2020-12-03 DIAGNOSIS — H2513 Age-related nuclear cataract, bilateral: Secondary | ICD-10-CM | POA: Diagnosis not present

## 2020-12-08 ENCOUNTER — Telehealth: Payer: Self-pay | Admitting: Cardiology

## 2020-12-08 NOTE — Telephone Encounter (Signed)
Follow Up:     Pt said she was told to take her blood readings 3 times a day for a week.Marland Kitchen Her Valsartan was last taken on 11-28-20   12-01-20-    145/90  Morning     141/87  Afternoon      121/79 Evening   12-02-20-     135/86                    114/79                       89/62        9:00 P.M. 99/66    12-03-20       140/89                    102/75                       153 over something   130/80 about 9:00     12-04-20-      147/88                     134/85                       128/86      12-05-20-         152/93    140/90       114/81                      124/77       12-06-20          145/93                       87/58                         132/57        12-07-20-         156/94      147/89      143/89                        77/55        117/80         12-08-20-           138/89                       105/67

## 2020-12-08 NOTE — Telephone Encounter (Signed)
Spoke with the patient. She was sending in her blood pressures. She stated when it does run low she feels tired and no energy.   She is going to the beach tomorrow for two weeks. She has been advised to keep checking her blood pressure twice daily and to take it easy at night since that is when her blood pressure tends to run low. She stated that she does not drive at night.

## 2020-12-09 NOTE — Telephone Encounter (Signed)
I agree with the plan. Thanks!

## 2020-12-09 NOTE — Telephone Encounter (Signed)
Patient aware. Appointment 01/27/21

## 2020-12-26 ENCOUNTER — Telehealth: Payer: Self-pay | Admitting: Cardiology

## 2020-12-26 NOTE — Telephone Encounter (Signed)
Pt c/o BP issue: STAT if pt c/o blurred vision, one-sided weakness or slurred speech  1. What are your last 5 BP readings?  147/83 HR 73 181/97 HR 76 168/92 HR 72 117/73 HR 72 110/74 HR 81 133/83 HR 71  2. Are you having any other symptoms (ex. Dizziness, headache, blurred vision, passed out)? Head feels very heavy   3. What is your BP issue?  Pt is calling with concerns about how her blood pressure keeps elevating when she is not doing anything.PT states that she has not been taking any medication like the Dr. Rockey Situ her.She states she feels she is having panic attacks and taking zanax to calm her nerves.PT wants to know what does she need to do to get the issue under control.

## 2020-12-26 NOTE — Telephone Encounter (Signed)
If blood pressure issues are resolving without additional intervention and patient is not experiencing symptoms, we don't need to do anything.  Remember to stay hydrated to avoid low numbers, and continue to take medication for anxiety as prescribed.

## 2020-12-26 NOTE — Telephone Encounter (Signed)
Spoke with pt. She report her BP continues to be all over the place (readings listed below). Today BP was on the high side, but pt report she had just received a call from her neighbors saying they had [place] so she was very anxious. Pt is questioning what she should do when BP is elevated or too low.   Nurse advised to increase fluids when BP is low.  4/22 147/83 HR 73  8am 181/97 HR 76  11am  168/92 HR 72 11:40 146/90 HR 73 (an hour after taking 1/2 xanax)  4/21 117/73 HR 72  9am 147/84 HR 69 5 pm   4/20 110/74 HR 81  9am 121/70 HR 76   4/19 73/55 HR 78  8am 78/58 HR 80 (an hour later)

## 2020-12-26 NOTE — Telephone Encounter (Signed)
Pt updated and verbalized understanding.  

## 2020-12-31 ENCOUNTER — Encounter (INDEPENDENT_AMBULATORY_CARE_PROVIDER_SITE_OTHER): Payer: Medicare PPO | Admitting: Ophthalmology

## 2020-12-31 ENCOUNTER — Telehealth: Payer: Self-pay | Admitting: Cardiology

## 2020-12-31 ENCOUNTER — Other Ambulatory Visit: Payer: Self-pay

## 2020-12-31 DIAGNOSIS — H35033 Hypertensive retinopathy, bilateral: Secondary | ICD-10-CM | POA: Diagnosis not present

## 2020-12-31 DIAGNOSIS — H353231 Exudative age-related macular degeneration, bilateral, with active choroidal neovascularization: Secondary | ICD-10-CM | POA: Diagnosis not present

## 2020-12-31 DIAGNOSIS — H43813 Vitreous degeneration, bilateral: Secondary | ICD-10-CM | POA: Diagnosis not present

## 2020-12-31 DIAGNOSIS — I10 Essential (primary) hypertension: Secondary | ICD-10-CM | POA: Diagnosis not present

## 2020-12-31 DIAGNOSIS — D3131 Benign neoplasm of right choroid: Secondary | ICD-10-CM | POA: Diagnosis not present

## 2020-12-31 NOTE — Telephone Encounter (Signed)
Pt called to report that her BP when she got up this morning was 153/84 but she has an appt to have injections in her eyes this afternoon and she is very anxious about it so she checked her BP and it was 181/101 HR 72...Marland KitchenMarland Kitchen she had a headache earlier but has since gotten better since she ate lunch and had something to drink.   She also took 1/2 a xanax since she is very anxious.   She will try to relax and recheck her BP after she relaxes for about 15-20 min. She will call us if she has any worsening symptoms... she will have someone take her to her eye appt this afternoon and says they will also be checking her BP prior to her injections.

## 2020-12-31 NOTE — Telephone Encounter (Signed)
Pt c/o BP issue: STAT if pt c/o blurred vision, one-sided weakness or slurred speech  1. What are your last 5 BP readings? 181/101 HR 72  2. Are you having any other symptoms (ex. Dizziness, headache, blurred vision, passed out)? Headache, some dizziness  3. What is your BP issue? Patient stats she just doesn't feel like her normal self   Pt c/o Shortness Of Breath: STAT if SOB developed within the last 24 hours or pt is noticeably SOB on the phone  1. Are you currently SOB (can you hear that pt is SOB on the phone)? yes  2. How long have you been experiencing SOB? hr  3. Are you SOB when sitting or when up moving around? both  4. Are you currently experiencing any other symptoms? Headache, some dizziness

## 2021-01-01 NOTE — Telephone Encounter (Signed)
Stopped Valsartan on ~3/29  IF readings are low or high and she feels bad patient retakes 1 hour later  3/30 140/89 73 am (she had injection) pm SBP 153 3/31: okay 4/1: 140/90 69 am 124/79 69 pm 4/2: 145/93 70 am 87/58 74 pm 132/57 69  4/3: 147/89 68 am 77/55 77 pm 117/80 66  4/4: 138/89 71 am 132/80 72 pm 4/5: 138/81 69 am 4/6 112/75 73 am 89/57 80 pm  4/7: 141/84 69 am 82/60 78 pm  4/8: 146/88 75 am 117/76 68 pm 4/9: 162/92 69 am 126/82 70 pm  4/10:119/79 74 am 162/92 67 pm 163/90 73 (Took 1/2 xanax) 141/96 66 4/11: 141/89 69 am 114/74 72 pm 4/12: 157/90 68 am 96/68 78 107/67 70 pm 4/13:160/92 69 am 123/77 73 pm 4/14: 146/86 54 am 91/63 75 pm 4/15: 140/86 71 am 80/61 75 pm  4/16: 116/71 74 am 104/73 77 pm 4/17: 111/69 75 am  4/18: 133/83 71 am 4/19: 78/55 78 am 78/58 80 pm (fall)  4/20: 110/74 81 am 121/70 68 pm  4/21: 117/73 72 am 147/84 69 pm  4/22: 147/83 73 am (found out neighbors had covid) 181/97 76 pm 1/2 xanax 146/90 73 pm 111/70 70  4/23: 135/83 74 am 118/72 72 pm  4/24: 149/88 75 am 158/94 71 pm (took covid test, neg)  97/62 73 4/25:142/91 83 am 156/96 73 122/63 71 pm  4/26:148/89 79 am 145/92 71 pm 4/27: 153/84 78 am 180/101 72 pm (xanax and called our office) 147/92 78 pm (in injection office) SBP 145 4/28: 144/92 72 am 155/91 68 (1/2 xanax) now 142/86 74  Patient wants Dr. Percival Spanish to review readings and see if he wants to make any changes.  Gave ED precautions. Patient verbalized understanding.

## 2021-01-01 NOTE — Telephone Encounter (Signed)
PT is calling back with additional questions to the conversation that was discussed yesterday.Please advise

## 2021-01-03 NOTE — Telephone Encounter (Signed)
I think that these look OK.  I would not change any meds.

## 2021-01-05 NOTE — Telephone Encounter (Signed)
This RN called the patient back at [phone], confirmed patient identity with name and birth date. This RN relayed the following message to the patient:  [name], MD  Physician  Cardiology  Telephone Encounter  Signed  Creation Time:  01/03/2021 9:25 AM           Signed        I think that these look OK.  I would not change any meds.         The patient had further concerns about what to take if/when her blood pressure gets very high or very low. She reports when she took her blood pressure today it was "normal". Patient reports in the past she had taken alprazolam when her blood pressure is high. This RN told patient her questions would be forwarded to Dr. Percival Spanish for further explanation. This RN advised patient to go to the nearest emergency room or call 911 should she develop symptoms, patient verbalized understanding.

## 2021-01-05 NOTE — Telephone Encounter (Signed)
Pt returning call from earlier today 

## 2021-01-05 NOTE — Telephone Encounter (Signed)
Attempted to call patient. Unable to leave voicemail.  

## 2021-01-26 DIAGNOSIS — I350 Nonrheumatic aortic (valve) stenosis: Secondary | ICD-10-CM | POA: Insufficient documentation

## 2021-01-26 NOTE — Progress Notes (Signed)
Cardiology Office Note   Date:  01/27/2021   ID:  [name] [name], DOB 13-Jan-1936, MRN [id number]  PCP:  [name] Huddle, MD  Cardiologist:   [name] Breeding, MD   Chief Complaint  Patient presents with  . Dizziness  . Fatigue    History of Present Illness: [name] [name] is a 85 y.o. female who presents for evaluation of difficult to control hypertension.  She had heart block and did have a pacemaker placed.   Since I last saw her she was seen by [name] [name] after an ED visit for dizziness.  This was thought to be secondary to low BPs.    She brings me an extensive blood pressure diary.  It is fluctuating.  However, it is typically well controlled.  There might be some systolics in the 229N and very rarely of 180 and she took a Xanax when this happened.  She is also had some low systolics below 989 and she has been lightheaded.  She is not describing new orthostasis.  She walks with a cane.  She goes up and down the stairs once a day to go to bed.  She takes care of her older husband.  She is not having any new palpitations, presyncope or syncope.  She is not having any chest pain.  She does have some tomato plants that her neighbor planted for her.  Past Medical History:  Diagnosis Date  . Anxiety   . Broken arm Right  . Broken leg   . Complete heart block ([place])   . Hypertension   . Postural dizziness     Past Surgical History:  Procedure Laterality Date  . EP IMPLANTABLE DEVICE N/A 01/16/2015   MDT Adapta L PPM implanted by Dr Rayann Heman for complete heart block  . IR GENERIC HISTORICAL  09/15/2016   IR RADIOLOGIST EVAL & MGMT 09/15/2016 [facility]  . IR GENERIC HISTORICAL  09/28/2016   IR KYPHO THORACIC WITH BONE BIOPSY 09/28/2016 [name] Bras, MD [facility]  . IR GENERIC HISTORICAL  10/14/2016   IR RADIOLOGIST EVAL & MGMT 10/14/2016 [facility]  . PACEMAKER INSERTION N/A 01/2015     Current Outpatient  Medications  Medication Sig Dispense Refill  . acetaminophen (TYLENOL) 500 MG tablet Take 500 mg by mouth every 6 (six) hours as needed for mild pain.    Marland Kitchen ALPRAZolam (XANAX) 0.25 MG tablet Take 0.125 tablets by mouth daily as needed for anxiety.     Marland Kitchen aspirin 325 MG tablet Take 650 mg by mouth at bedtime as needed for moderate pain. Will take at bedtime if needed for pain    . aspirin 81 MG tablet Take 81 mg by mouth daily.    Marland Kitchen Besifloxacin HCl 0.6 % SUSP Apply 1 drop to eye 4 (four) times daily. For 2 days after injection    . Bevacizumab (AVASTIN) 100 MG/4ML SOLN Inject into the vein.    . calcium carbonate (TUMS EX) 750 MG chewable tablet Chew 1 tablet by mouth 2 (two) times daily as needed.     . Cholecalciferol (VITAMIN D3) 2000 units TABS Take 1 tablet by mouth daily.    . famotidine (PEPCID) 20 MG tablet Take 10-20 mg by mouth daily.     . fluticasone (FLONASE) 50 MCG/ACT nasal spray Place 2 sprays into both nostrils daily as needed for allergies.     Marland Kitchen lidocaine (LIDODERM) 5 % Place 1 patch onto the skin daily as needed. Remove & Discard patch within 12 hours  or as directed by MD    . Magnesium Hydroxide (PHILLIPS MILK OF MAGNESIA PO) Take 1-2 tablets by mouth 2 (two) times daily as needed.    . Multiple Vitamins-Minerals (ICAPS MV PO) Take 1 tablet by mouth daily.    Marland Kitchen PRESCRIPTION MEDICATION Place into both eyes every 30 (thirty) days. Avastin injection in right eye, Ilea in left eye every 4 weeks by Dr [name] [name]     No current facility-administered medications for this visit.    Allergies:   Hctz [hydrochlorothiazide], Losartan, Macrolides and ketolides, Amlodipine, Benazepril, and Penicillins    ROS:  Please see the history of present illness.   Otherwise, review of systems are positive for fatigue.   All other systems are reviewed and negative.    PHYSICAL EXAM: VS:  BP (!) 158/90 (BP Location: Left Arm, Patient Position: Sitting, Cuff Size: Normal)   Pulse 80   Ht 5\' 4"   (1.626 m)   Wt 172 lb (78 kg)   SpO2 95%   BMI 29.52 kg/m  , BMI Body mass index is 29.52 kg/m.  GEN:  No distress NECK:  No jugular venous distention at 90 degrees, waveform within normal limits, carotid upstroke brisk and symmetric, no bruits, no thyromegaly LUNGS:  Clear to auscultation bilaterally CHEST:  Unremarkable HEART:  S1 and S2 within normal limits, no S3, no S4, no clicks, no rubs, no murmurs.  Distant heart sounds ABD:  Positive bowel sounds normal in frequency in pitch, no bruits, no rebound, no guarding, unable to assess midline mass or bruit with the patient seated. EXT:  2 plus pulses throughout, moderate edema, no cyanosis no clubbing  EKG:  EKG is not ordered today.  Recent Labs: 09/10/2020: Hemoglobin 14.5; Platelets 282 10/28/2020: BUN 16; Creatinine, Ser 0.88; Potassium 4.8; Sodium 141      Wt Readings from Last 3 Encounters:  01/27/21 172 lb (78 kg)  10/28/20 168 lb (76.2 kg)  10/20/20 168 lb 6.4 oz (76.4 kg)      Other studies Reviewed: Additional studies/ records that were reviewed today include: ED records Review of the above records demonstrates:   See elsewhere   ASSESSMENT AND PLAN:  Complete heart block/Status post pacemaker She is up-to-date with follow-up.  HTN Her blood pressure is labile.  I am much more worried about low blood pressure so I am not going to start anything to manage the highs and she will take an anxiety pill as one of her first line was on the rare occasion when her blood pressure is elevated.    At this point no antihypertensives.    AS This was mild in Nov last year.  I will follow this clinically.   Dizziness:   This will be managed conservatively as above.  She will avoid sudden postural changes.  She is to hydrate well and use salt if her blood pressure is running low.  She might wear compression stockings on those days.   Current medicines are reviewed at length with the patient today.  The patient does not have  concerns regarding medicines.  The following changes have been made:   None  Labs/ tests ordered today include:   None  No orders of the defined types were placed in this encounter.    Disposition:   FU with APP in 4 months.   Signed, [name] Breeding, MD  01/27/2021 3:16 PM    [facility]

## 2021-01-27 ENCOUNTER — Encounter: Payer: Self-pay | Admitting: Cardiology

## 2021-01-27 ENCOUNTER — Other Ambulatory Visit: Payer: Self-pay

## 2021-01-27 ENCOUNTER — Ambulatory Visit: Payer: Medicare PPO | Admitting: Cardiology

## 2021-01-27 VITALS — BP 158/90 | HR 80 | Ht 64.0 in | Wt 172.0 lb

## 2021-01-27 DIAGNOSIS — I442 Atrioventricular block, complete: Secondary | ICD-10-CM

## 2021-01-27 DIAGNOSIS — I10 Essential (primary) hypertension: Secondary | ICD-10-CM

## 2021-01-27 DIAGNOSIS — I350 Nonrheumatic aortic (valve) stenosis: Secondary | ICD-10-CM | POA: Diagnosis not present

## 2021-01-27 NOTE — Patient Instructions (Signed)
Medication Instructions:  No Changes In Medications at this time.  *If you need a refill on your cardiac medications before your next appointment, please call your pharmacy*  Follow-Up: At [facility], you and your health needs are our priority.  As part of our continuing mission to provide you with exceptional heart care, we have created designated Provider Care Teams.  These Care Teams include your primary Cardiologist (physician) and Advanced Practice Providers (APPs -  Physician Assistants and Nurse Practitioners) who all work together to provide you with the care you need, when you need it.  We recommend signing up for the patient portal called "MyChart".  Sign up information is provided on this After Visit Summary.  MyChart is used to connect with patients for Virtual Visits (Telemedicine).  Patients are able to view lab/test results, encounter notes, upcoming appointments, etc.  Non-urgent messages can be sent to your provider as well.   To learn more about what you can do with MyChart, go to [email or web address].    Your next appointment:   6 month(s)  The format for your next appointment:   In Person  Provider:   You will see one of the following Advanced Practice Providers on your designated Care Team:    Rosaria Ferries, PA-C  Jory Sims, DNP, ANP  Coletta Memos, NP

## 2021-01-28 ENCOUNTER — Encounter (INDEPENDENT_AMBULATORY_CARE_PROVIDER_SITE_OTHER): Payer: Medicare PPO | Admitting: Ophthalmology

## 2021-01-28 DIAGNOSIS — H35033 Hypertensive retinopathy, bilateral: Secondary | ICD-10-CM

## 2021-01-28 DIAGNOSIS — H43813 Vitreous degeneration, bilateral: Secondary | ICD-10-CM | POA: Diagnosis not present

## 2021-01-28 DIAGNOSIS — I10 Essential (primary) hypertension: Secondary | ICD-10-CM | POA: Diagnosis not present

## 2021-01-28 DIAGNOSIS — H353231 Exudative age-related macular degeneration, bilateral, with active choroidal neovascularization: Secondary | ICD-10-CM

## 2021-01-28 DIAGNOSIS — D3131 Benign neoplasm of right choroid: Secondary | ICD-10-CM

## 2021-02-17 ENCOUNTER — Ambulatory Visit (INDEPENDENT_AMBULATORY_CARE_PROVIDER_SITE_OTHER): Payer: Medicare PPO

## 2021-02-17 DIAGNOSIS — I442 Atrioventricular block, complete: Secondary | ICD-10-CM

## 2021-02-19 LAB — CUP PACEART REMOTE DEVICE CHECK
Battery Impedance: 562 Ohm
Battery Remaining Longevity: 85 mo
Battery Voltage: 2.78 V
Brady Statistic AP VP Percent: 17 %
Brady Statistic AP VS Percent: 0 %
Brady Statistic AS VP Percent: 83 %
Brady Statistic AS VS Percent: 0 %
Date Time Interrogation Session: 20220615100105
Implantable Lead Implant Date: 20160512
Implantable Lead Implant Date: 20160512
Implantable Lead Location: 753859
Implantable Lead Location: 753860
Implantable Lead Model: 5076
Implantable Lead Model: 5092
Implantable Pulse Generator Implant Date: 20160512
Lead Channel Impedance Value: 463 Ohm
Lead Channel Impedance Value: 880 Ohm
Lead Channel Pacing Threshold Amplitude: 0.75 V
Lead Channel Pacing Threshold Amplitude: 0.875 V
Lead Channel Pacing Threshold Pulse Width: 0.4 ms
Lead Channel Pacing Threshold Pulse Width: 0.4 ms
Lead Channel Setting Pacing Amplitude: 2 V
Lead Channel Setting Pacing Amplitude: 2.5 V
Lead Channel Setting Pacing Pulse Width: 0.4 ms
Lead Channel Setting Sensing Sensitivity: 4 mV

## 2021-02-25 ENCOUNTER — Other Ambulatory Visit: Payer: Self-pay

## 2021-02-25 ENCOUNTER — Encounter (INDEPENDENT_AMBULATORY_CARE_PROVIDER_SITE_OTHER): Payer: Medicare PPO | Admitting: Ophthalmology

## 2021-02-25 DIAGNOSIS — H43813 Vitreous degeneration, bilateral: Secondary | ICD-10-CM

## 2021-02-25 DIAGNOSIS — I10 Essential (primary) hypertension: Secondary | ICD-10-CM

## 2021-02-25 DIAGNOSIS — H35033 Hypertensive retinopathy, bilateral: Secondary | ICD-10-CM | POA: Diagnosis not present

## 2021-02-25 DIAGNOSIS — D3131 Benign neoplasm of right choroid: Secondary | ICD-10-CM | POA: Diagnosis not present

## 2021-02-25 DIAGNOSIS — H2513 Age-related nuclear cataract, bilateral: Secondary | ICD-10-CM

## 2021-02-25 DIAGNOSIS — H353231 Exudative age-related macular degeneration, bilateral, with active choroidal neovascularization: Secondary | ICD-10-CM

## 2021-03-10 NOTE — Progress Notes (Signed)
Remote pacemaker transmission.   

## 2021-03-25 ENCOUNTER — Encounter (INDEPENDENT_AMBULATORY_CARE_PROVIDER_SITE_OTHER): Payer: Medicare PPO | Admitting: Ophthalmology

## 2021-03-26 ENCOUNTER — Other Ambulatory Visit: Payer: Self-pay

## 2021-03-26 ENCOUNTER — Encounter (INDEPENDENT_AMBULATORY_CARE_PROVIDER_SITE_OTHER): Payer: Medicare PPO | Admitting: Ophthalmology

## 2021-03-26 DIAGNOSIS — H353231 Exudative age-related macular degeneration, bilateral, with active choroidal neovascularization: Secondary | ICD-10-CM

## 2021-03-26 DIAGNOSIS — D3131 Benign neoplasm of right choroid: Secondary | ICD-10-CM | POA: Diagnosis not present

## 2021-03-26 DIAGNOSIS — H35033 Hypertensive retinopathy, bilateral: Secondary | ICD-10-CM

## 2021-03-26 DIAGNOSIS — H43813 Vitreous degeneration, bilateral: Secondary | ICD-10-CM | POA: Diagnosis not present

## 2021-03-26 DIAGNOSIS — I10 Essential (primary) hypertension: Secondary | ICD-10-CM

## 2021-03-26 DIAGNOSIS — H2513 Age-related nuclear cataract, bilateral: Secondary | ICD-10-CM | POA: Diagnosis not present

## 2021-04-23 ENCOUNTER — Other Ambulatory Visit: Payer: Self-pay

## 2021-04-23 ENCOUNTER — Encounter (INDEPENDENT_AMBULATORY_CARE_PROVIDER_SITE_OTHER): Payer: Medicare PPO | Admitting: Ophthalmology

## 2021-04-23 DIAGNOSIS — H43813 Vitreous degeneration, bilateral: Secondary | ICD-10-CM

## 2021-04-23 DIAGNOSIS — I10 Essential (primary) hypertension: Secondary | ICD-10-CM | POA: Diagnosis not present

## 2021-04-23 DIAGNOSIS — D3131 Benign neoplasm of right choroid: Secondary | ICD-10-CM

## 2021-04-23 DIAGNOSIS — H353231 Exudative age-related macular degeneration, bilateral, with active choroidal neovascularization: Secondary | ICD-10-CM | POA: Diagnosis not present

## 2021-04-23 DIAGNOSIS — H35033 Hypertensive retinopathy, bilateral: Secondary | ICD-10-CM

## 2021-04-28 DIAGNOSIS — Z950 Presence of cardiac pacemaker: Secondary | ICD-10-CM | POA: Diagnosis not present

## 2021-04-28 DIAGNOSIS — G4700 Insomnia, unspecified: Secondary | ICD-10-CM | POA: Diagnosis not present

## 2021-04-28 DIAGNOSIS — I10 Essential (primary) hypertension: Secondary | ICD-10-CM | POA: Diagnosis not present

## 2021-04-28 DIAGNOSIS — R0609 Other forms of dyspnea: Secondary | ICD-10-CM | POA: Diagnosis not present

## 2021-04-28 DIAGNOSIS — F419 Anxiety disorder, unspecified: Secondary | ICD-10-CM | POA: Diagnosis not present

## 2021-04-28 DIAGNOSIS — R55 Syncope and collapse: Secondary | ICD-10-CM | POA: Diagnosis not present

## 2021-04-28 DIAGNOSIS — R3915 Urgency of urination: Secondary | ICD-10-CM | POA: Diagnosis not present

## 2021-04-28 DIAGNOSIS — E559 Vitamin D deficiency, unspecified: Secondary | ICD-10-CM | POA: Diagnosis not present

## 2021-05-19 ENCOUNTER — Ambulatory Visit (INDEPENDENT_AMBULATORY_CARE_PROVIDER_SITE_OTHER): Payer: Medicare PPO

## 2021-05-19 DIAGNOSIS — I441 Atrioventricular block, second degree: Secondary | ICD-10-CM

## 2021-05-20 LAB — CUP PACEART REMOTE DEVICE CHECK
Battery Impedance: 611 Ohm
Battery Remaining Longevity: 81 mo
Battery Voltage: 2.78 V
Brady Statistic AP VP Percent: 19 %
Brady Statistic AP VS Percent: 0 %
Brady Statistic AS VP Percent: 80 %
Brady Statistic AS VS Percent: 1 %
Date Time Interrogation Session: 20220913101639
Implantable Lead Implant Date: 20160512
Implantable Lead Implant Date: 20160512
Implantable Lead Location: 753859
Implantable Lead Location: 753860
Implantable Lead Model: 5076
Implantable Lead Model: 5092
Implantable Pulse Generator Implant Date: 20160512
Lead Channel Impedance Value: 470 Ohm
Lead Channel Impedance Value: 783 Ohm
Lead Channel Pacing Threshold Amplitude: 0.625 V
Lead Channel Pacing Threshold Amplitude: 0.75 V
Lead Channel Pacing Threshold Pulse Width: 0.4 ms
Lead Channel Pacing Threshold Pulse Width: 0.4 ms
Lead Channel Setting Pacing Amplitude: 2 V
Lead Channel Setting Pacing Amplitude: 2.5 V
Lead Channel Setting Pacing Pulse Width: 0.4 ms
Lead Channel Setting Sensing Sensitivity: 4 mV

## 2021-05-21 ENCOUNTER — Other Ambulatory Visit: Payer: Self-pay

## 2021-05-21 ENCOUNTER — Encounter (INDEPENDENT_AMBULATORY_CARE_PROVIDER_SITE_OTHER): Payer: Medicare PPO | Admitting: Ophthalmology

## 2021-05-21 DIAGNOSIS — H35033 Hypertensive retinopathy, bilateral: Secondary | ICD-10-CM | POA: Diagnosis not present

## 2021-05-21 DIAGNOSIS — H43813 Vitreous degeneration, bilateral: Secondary | ICD-10-CM | POA: Diagnosis not present

## 2021-05-21 DIAGNOSIS — I10 Essential (primary) hypertension: Secondary | ICD-10-CM | POA: Diagnosis not present

## 2021-05-21 DIAGNOSIS — D3131 Benign neoplasm of right choroid: Secondary | ICD-10-CM

## 2021-05-21 DIAGNOSIS — H353231 Exudative age-related macular degeneration, bilateral, with active choroidal neovascularization: Secondary | ICD-10-CM | POA: Diagnosis not present

## 2021-05-27 NOTE — Progress Notes (Signed)
Remote pacemaker transmission.   

## 2021-06-19 ENCOUNTER — Other Ambulatory Visit: Payer: Self-pay

## 2021-06-19 ENCOUNTER — Encounter (INDEPENDENT_AMBULATORY_CARE_PROVIDER_SITE_OTHER): Payer: Medicare PPO | Admitting: Ophthalmology

## 2021-06-19 DIAGNOSIS — I10 Essential (primary) hypertension: Secondary | ICD-10-CM | POA: Diagnosis not present

## 2021-06-19 DIAGNOSIS — D3131 Benign neoplasm of right choroid: Secondary | ICD-10-CM | POA: Diagnosis not present

## 2021-06-19 DIAGNOSIS — H43813 Vitreous degeneration, bilateral: Secondary | ICD-10-CM

## 2021-06-19 DIAGNOSIS — H353231 Exudative age-related macular degeneration, bilateral, with active choroidal neovascularization: Secondary | ICD-10-CM

## 2021-06-19 DIAGNOSIS — H35033 Hypertensive retinopathy, bilateral: Secondary | ICD-10-CM | POA: Diagnosis not present

## 2021-07-17 ENCOUNTER — Encounter (INDEPENDENT_AMBULATORY_CARE_PROVIDER_SITE_OTHER): Payer: Medicare PPO | Admitting: Ophthalmology

## 2021-07-17 ENCOUNTER — Other Ambulatory Visit: Payer: Self-pay

## 2021-07-17 DIAGNOSIS — H353231 Exudative age-related macular degeneration, bilateral, with active choroidal neovascularization: Secondary | ICD-10-CM | POA: Diagnosis not present

## 2021-07-17 DIAGNOSIS — H43813 Vitreous degeneration, bilateral: Secondary | ICD-10-CM

## 2021-07-17 DIAGNOSIS — H35033 Hypertensive retinopathy, bilateral: Secondary | ICD-10-CM | POA: Diagnosis not present

## 2021-07-17 DIAGNOSIS — I10 Essential (primary) hypertension: Secondary | ICD-10-CM | POA: Diagnosis not present

## 2021-07-17 DIAGNOSIS — D3131 Benign neoplasm of right choroid: Secondary | ICD-10-CM

## 2021-07-27 ENCOUNTER — Encounter: Payer: Self-pay | Admitting: Internal Medicine

## 2021-07-27 NOTE — Telephone Encounter (Signed)
error 

## 2021-08-10 NOTE — Progress Notes (Signed)
Cardiology Clinic Note   Patient Name: [name] [name] Date of Encounter: 08/11/2021  Primary Care Provider:  [name], MD Primary Cardiologist:  [name] Breeding, MD  Patient Profile    [name] [name] is a 85 y.o. female who presents to the clinic today for follow-up evaluation of her essential hypertension.  Past Medical History    Past Medical History:  Diagnosis Date   Anxiety    Broken arm Right   Broken leg    Complete heart block (HCC)    Hypertension    Postural dizziness    Past Surgical History:  Procedure Laterality Date   EP IMPLANTABLE DEVICE N/A 01/16/2015   MDT Adapta L PPM implanted by Dr [name] [name] for complete heart block   IR GENERIC HISTORICAL  09/15/2016   IR RADIOLOGIST EVAL & MGMT 09/15/2016 [facility]   IR GENERIC HISTORICAL  09/28/2016   IR KYPHO THORACIC WITH BONE BIOPSY 09/28/2016 [name] Bras, MD [facility]   IR GENERIC HISTORICAL  10/14/2016   IR RADIOLOGIST EVAL & MGMT 10/14/2016 [facility]   PACEMAKER INSERTION N/A 01/2015    Allergies  Allergies  Allergen Reactions   Hctz [Hydrochlorothiazide] Shortness Of Breath and Other (See Comments)    Very short winded, made her hurt all over    Losartan Other (See Comments)    Skin crawling feelings   Macrolides And Ketolides Other (See Comments)    Pyloric sphincter flareups-unsure of which -mycins   Amlodipine Other (See Comments)   Benazepril Other (See Comments)    Doesn't work for patient   Penicillins Other (See Comments)    abd pain Has patient had a PCN reaction causing immediate rash, facial/tongue/throat swelling, SOB or lightheadedness with hypotension: No Has patient had a PCN reaction causing severe rash involving mucus membranes or skin necrosis: No Has patient had a PCN reaction that required hospitalization No Has patient had a PCN reaction occurring within the last 10 years: No If all of the above answers are "NO", then may proceed with Cephalosporin use.       History of Present Illness    [name] has a PMH of hypertension, second-degree Mobitz type II, CHB status post PPM 5/16, cardiac murmur   She was last seen by Dr. Percival [name] on 06/10/2020.  During that time she reported that she had injured her back while digging her flowers.  She had recovered but reported that it took several weeks for her to feel better.  She was walking with her cane.  She had done well from a cardiovascular standpoint.  She reported that she would get short of breath with climbing stairs but denied increased work of breathing at rest.  She denied palpitations, presyncope, syncope, and weight gain or lower extremity swelling.  She was overdue for her EP follow-up at that time and it was recommended that she schedule follow-up appointment.  Her blood pressure was mildly elevated and she was instructed to keep a blood pressure log.  Her echocardiogram was repeated 07/08/2020 showed an EF of 50- 55%, G1 DD mild aortic stenosis.   She presented to the emergency department on 09/11/2020.  She reported that she had a near syncopal episode at home when she woke up.  Her symptoms went away and then returned.  Her blood pressure at that time was 168/110, her EKG showed a paced rhythm at 83 bpm.  She reported that she had episodes like this in the past when she was at the beach.  There was  an extensive work-up performed while she was at the hospital there which did not find a cause.  She reported that she does suffer from anxiety and that she takes her Xanax and feels better.  While in the emergency department she denied chest pain.  She did report that she had injections into her eyes the day prior to her 1/22 ED visit.  Her blood pressure normalized.  Her pacemaker was interrogated but did not show any arrhythmias during the episode.  She was discharged in stable condition.   She was seen virtually 09/18/2020 and stated she had continued to have some sensation of flushing/lightheadedness over  the last week.  She noticed that this was mainly in the morning when she would get up.  She would take her blood pressure and notes that his elevated in the mornings.  After she took her medication it would come down into the 952W-413K systolic over 44W/10U diastolic.  She had also noted some lower blood pressures in the 110s over 70s.  She reported that her symptoms that week were not as severe as they had been last week when she went to the emergency department.  I  instructed her to start wearing lower extremity support stockings, increase her p.o. hydration, and follow-up with electrophysiology.  I  continued her amlodipine 5 mg and 2.5 mg as needed.   Follow-up was planned for 1 month.   She was seen by Dr. Rayann [name] 10/20/2020. She reported she was doing reasonably well. She did report some recent dizziness/presyncope. It was felt these episodes were postural. She denied palpitations, chest pain, shortness of breath, lower extremity edema. Her pacemaker was functioning normally. No arrhythmias noted on PPM reviewed. RV sensitivity lead was reduced from 2.4-23mV because she is device dependent. Her blood pressure was stable at that time. Follow-up was planned for 1 year.  She presents the clinic 10/28/20 for follow-up evaluation stated she had been managing better with her reduced dose of valsartan.  She contacted Dr. Jackalyn [name] office again on 10/24/2020.  Her pacemaker was reinterrogated and showed no arrhythmias.  She felt that her blood pressure was much better controlled on her current therapy.  She brought her blood pressure log  which showed blood pressures ranging in the 120s-130s over 70s and 80s.  I will repeated a BMP , instructed her to maintain her p.o. hydration, and planned her follow-up with Dr. Percival [name] in 3 months.  She presents to the clinic today for follow-up evaluation states she feels well.  She reports that she does have occasional leg cramping at night.  These usually happen after a  increased day of physical activity.  She walks around and her cramps go away.  She denies cramping with walking.  She also notices some chronic back pain.  She no longer works out in her yard due to her back discomfort.  Her blood pressure has been well controlled at home off medical therapy.  She reports that her blood pressures are ranging in the 725D systolic at home.  She has been watching her diet and avoiding salt.  She reports that she occasionally does eat things with high salt but is very rare.  She has been traveling to her granddaughter's soccer games.  She plans to spend time with her family for the holidays and is looking forward to going back to the beach and January.  She will stay at her place with her husband.  She continues to be very cautious of COVID.  She is  somewhat physically active walking around the block where she lives.  I will give her information on leg cramps, demonstrated right calf stretches, will give her the salty 6 diet sheet, have her maintain her physical activity, and plan follow-up in 6 to 9 months.  Mr.  Today she denies chest pain, shortness of breath, lower extremity edema, fatigue, palpitations, melena, hematuria, hemoptysis, diaphoresis, weakness, orthopnea, and PND.  Home Medications    Prior to Admission medications   Medication Sig Start Date End Date Taking? Authorizing Provider  acetaminophen (TYLENOL) 500 MG tablet Take 500 mg by mouth every 6 (six) hours as needed for mild pain.    [provider]  ALPRAZolam Duanne Moron) 0.25 MG tablet Take 0.125 tablets by mouth daily as needed for anxiety.  02/04/15   [provider]  aspirin 325 MG tablet Take 650 mg by mouth at bedtime as needed for moderate pain. Will take at bedtime if needed for pain    [provider]  aspirin 81 MG tablet Take 81 mg by mouth daily.    [provider]  Besifloxacin HCl 0.6 % SUSP Apply 1 drop to eye 4 (four) times daily. For 2 days after injection     [provider]  Bevacizumab (AVASTIN) 100 MG/4ML SOLN Inject into the vein.    [provider]  calcium carbonate (TUMS EX) 750 MG chewable tablet Chew 1 tablet by mouth 2 (two) times daily as needed.     [provider]  Cholecalciferol (VITAMIN D3) 2000 units TABS Take 1 tablet by mouth daily.    [provider]  famotidine (PEPCID) 20 MG tablet Take 10-20 mg by mouth daily.     [provider]  fluticasone (FLONASE) 50 MCG/ACT nasal spray Place 2 sprays into both nostrils daily as needed for allergies.  12/27/14   [provider]  lidocaine (LIDODERM) 5 % Place 1 patch onto the skin daily as needed. Remove & Discard patch within 12 hours or as directed by MD    [provider]  Magnesium Hydroxide (PHILLIPS MILK OF MAGNESIA PO) Take 1-2 tablets by mouth 2 (two) times daily as needed.    [provider]  Multiple Vitamins-Minerals (ICAPS MV PO) Take 1 tablet by mouth daily.    [provider]  PRESCRIPTION MEDICATION Place into both eyes every 30 (thirty) days. Avastin injection in right eye, Ilea in left eye every 4 weeks by Dr Zigmund Daniel    [provider]    Family History    Family History  Problem Relation Age of Onset   Heart failure Mother    Hypertension Mother    Hypertension Father    Atrial fibrillation Father    She indicated that her mother is deceased. She indicated that her father is deceased. She indicated that only one of her three sisters is alive. She indicated that her brother is alive. She indicated that her maternal grandmother is deceased. She indicated that her maternal grandfather is deceased. She indicated that her paternal grandmother is deceased. She indicated that her paternal grandfather is deceased.  Social History    Social History   Socioeconomic History   Marital status: Married    Spouse name: Not on file   Number of children: 2   Years of education: Not on file    Highest education level: Not on file  Occupational History   Not on file  Tobacco Use   Smoking status: Former    Years: 30.00  Types: Cigarettes    Quit date: 09/06/1986    Years since quitting: 34.9   Smokeless tobacco: Never  Vaping Use   Vaping Use: Never used  Substance and Sexual Activity   Alcohol use: Yes    Alcohol/week: 0.0 standard drinks    Comment: occasionally 1 monthly   Drug use: No   Sexual activity: Not on file  Other Topics Concern   Not on file  Social History Narrative   Lives with Deidre Ala her husband   Social Determinants of Health   Financial Resource Strain: Not on file  Food Insecurity: Not on file  Transportation Needs: Not on file  Physical Activity: Not on file  Stress: Not on file  Social Connections: Not on file  Intimate Partner Violence: Not on file     Review of Systems    General:  No chills, fever, night sweats or weight changes.  Cardiovascular:  No chest pain, dyspnea on exertion, edema, orthopnea, palpitations, paroxysmal nocturnal dyspnea. Dermatological: No rash, lesions/masses Respiratory: No cough, dyspnea Urologic: No hematuria, dysuria Abdominal:   No nausea, vomiting, diarrhea, bright red blood per rectum, melena, or hematemesis Neurologic:  No visual changes, wkns, changes in mental status. All other systems reviewed and are otherwise negative except as noted above.  Physical Exam    VS:  BP 138/82   Pulse 86   Ht 5\' 4"  (1.626 m)   Wt 176 lb 12.8 oz (80.2 kg)   SpO2 97%   BMI 30.35 kg/m  , BMI Body mass index is 30.35 kg/m. GEN: Well nourished, well developed, in no acute distress. HEENT: normal. Neck: Supple, no JVD, carotid bruits, or masses. Cardiac: RRR, no murmurs, rubs, or gallops. No clubbing, cyanosis, edema.  Radials/DP/PT 2+ and equal bilaterally.  Respiratory:  Respirations regular and unlabored, clear to auscultation bilaterally. GI: Soft, nontender, nondistended, BS + x 4. MS: no deformity or  atrophy. Skin: warm and dry, no rash. Neuro:  Strength and sensation are intact. Psych: Normal affect.  Accessory Clinical Findings    Recent Labs: 09/10/2020: Hemoglobin 14.5; Platelets 282 10/28/2020: BUN 16; Creatinine, Ser 0.88; Potassium 4.8; Sodium 141   Recent Lipid Panel    Component Value Date/Time   CHOL 136 01/16/2015 0523   TRIG 84 01/16/2015 0523   HDL 67 01/16/2015 0523   CHOLHDL 2.0 01/16/2015 0523   VLDL 17 01/16/2015 0523   LDLCALC 52 01/16/2015 0523    ECG personally reviewed by me today-none today.  Echocardiogram 07/08/2020 IMPRESSIONS     1. Septal dyssynchrony consistent with ventricular pacing. Left  ventricular ejection fraction, by estimation, is 50 to 55%. The left  ventricle has low normal function. The left ventricle demonstrates  regional wall motion abnormalities (see scoring  diagram/findings for description). There is mild concentric left  ventricular hypertrophy. Left ventricular diastolic parameters are  consistent with Grade I diastolic dysfunction (impaired relaxation).  Elevated left ventricular end-diastolic pressure.   2. Right ventricular systolic function is normal. The right ventricular  size is normal. There is normal pulmonary artery systolic pressure.   3. The mitral valve is normal in structure. No evidence of mitral valve  regurgitation. No evidence of mitral stenosis.   4. The aortic valve is normal in structure. There is moderate  calcification of the aortic valve. There is moderate thickening of the  aortic valve. Aortic valve regurgitation is not visualized. Mild aortic  valve stenosis. Aortic valve area, by VTI  measures 1.02 cm. Aortic valve mean gradient measures  10.0 mmHg. Aortic  valve Vmax measures 2.22 m/s.   5. The inferior vena cava is normal in size with greater than 50%  respiratory variability, suggesting right atrial pressure of 3 mmHg.   Assessment & Plan   1.  Essential hypertension-BP today 138/82.   Well-controlled at home.    Heart healthy low-sodium diet Increase physical activity as tolerated Continue blood pressure log-instructed to take blood pressure 1 hour after taking medication daily  Cardiac murmur-no increased DOE or activity intolerance. Echocardiogram showed mild aortic stenosis on 07/08/2020.   Heart healthy low-sodium diet-salty 6 given Increase physical activity as tolerated   Complete heart block status post PPM-follows with EP.    Dizziness- no recent episodes . Previously felt to be postural when evaluated by EP. BP continues to be stable.  Maintain p.o. hydration Continue amlodipine and amlodipine as needed Heart healthy diet Reviewed lower extremity support stockings  Continue blood pressure log   Disposition: Follow-up Dr. Percival [name] or me in 6-9  Jossie Ng. Lastacia Solum NP-C    08/11/2021, 4:08 PM [facility] [place] [address] Office [phone] Fax [phone]  Notice: This dictation was prepared with Dragon dictation along with smaller phrase technology. Any transcriptional errors that result from this process are unintentional and may not be corrected upon review.  I spent 13 minutes examining this patient, reviewing medications, and using patient centered shared decision making involving her cardiac care.  Prior to her visit I spent greater than 20 minutes reviewing her past medical history,  medications, and prior cardiac tests.

## 2021-08-11 ENCOUNTER — Other Ambulatory Visit: Payer: Self-pay

## 2021-08-11 ENCOUNTER — Ambulatory Visit: Payer: Medicare PPO | Admitting: General Practice

## 2021-08-11 ENCOUNTER — Encounter: Payer: Self-pay | Admitting: General Practice

## 2021-08-11 VITALS — BP 138/82 | HR 86 | Ht 64.0 in | Wt 176.8 lb

## 2021-08-11 DIAGNOSIS — I442 Atrioventricular block, complete: Secondary | ICD-10-CM | POA: Diagnosis not present

## 2021-08-11 DIAGNOSIS — R42 Dizziness and giddiness: Secondary | ICD-10-CM | POA: Diagnosis not present

## 2021-08-11 DIAGNOSIS — I10 Essential (primary) hypertension: Secondary | ICD-10-CM | POA: Diagnosis not present

## 2021-08-11 DIAGNOSIS — R011 Cardiac murmur, unspecified: Secondary | ICD-10-CM | POA: Diagnosis not present

## 2021-08-11 NOTE — Patient Instructions (Signed)
Medication Instructions:  The current medical regimen is effective;  continue present plan and medications as directed. Please refer to the Current Medication list given to you today.  *If you need a refill on your cardiac medications before your next appointment, please call your pharmacy*  Lab Work:   Testing/Procedures:  NONE    NONE  Special Instructions PLEASE READ AND FOLLOW SALTY 6-ATTACHED-1,800mg  daily  SEE ATTACHED LEG CRAMPING DIRECTIONS AND LEG STRETCHING  AS DISCUSSED WITH JESSE  TAKE AND LOG YOUR BLOOD PRESSURE 2-3 TIMES WEEKLY  Follow-Up: Your next appointment:  6-9 month(s) In Person with [name], MD  or Coletta Memos, FNP      Please call our office 2 months in advance to schedule this appointment.:1  At [facility], you and your health needs are our priority.  As part of our continuing mission to provide you with exceptional heart care, we have created designated Provider Care Teams.  These Care Teams include your primary Cardiologist (physician) and Advanced Practice Providers (APPs -  Physician Assistants and Nurse Practitioners) who all work together to provide you with the care you need, when you need it.  We recommend signing up for the patient portal called "MyChart".  Sign up information is provided on this After Visit Summary.  MyChart is used to connect with patients for Virtual Visits (Telemedicine).  Patients are able to view lab/test results, encounter notes, upcoming appointments, etc.  Non-urgent messages can be sent to your provider as well.   To learn more about what you can do with MyChart, go to [email or web address].              6 SALTY THINGS TO AVOID     1,800MG  DAILY     Leg Cramps Leg cramps occur when one or more muscles tighten and a person has no control over it (involuntary muscle contraction). Muscle cramps are most common in the calf muscles of the leg. They can occur during exercise or at rest. Leg cramps are painful, and  they may last for a few seconds to a few minutes. Cramps may return several times before they finally stop. Usually, leg cramps are not caused by a serious medical problem. In many cases, the cause is not known. Some common causes include: Excessive physical effort (overexertion), such as during intense exercise. Doing the same motion over and over. Staying in a certain position for a long period of time. Improper preparation, form, or technique while doing a sport or an activity. Dehydration. Injury. Side effects of certain medicines. Abnormally low levels of minerals in your blood (electrolytes), especially potassium and calcium. This could result from: Pregnancy. Taking diuretic medicines. Follow these instructions at home: Eating and drinking Drink enough fluid to keep your urine pale yellow. Staying hydrated may help prevent cramps. Eat a healthy diet that includes plenty of nutrients to help your muscles function. A healthy diet includes fruits and vegetables, lean protein, whole grains, and low-fat or nonfat dairy products. Managing pain, stiffness, and swelling   Try massaging, stretching, and relaxing the affected muscle. Do this for several minutes at a time. If directed, put ice on areas that are sore or painful after a cramp. To do this: Put ice in a plastic bag. Place a towel between your skin and the bag. Leave the ice on for 20 minutes, 2-3 times a day. Remove the ice if your skin turns bright red. This is very important. If you cannot feel pain, heat, or cold, you have  a greater risk of damage to the area. If directed, apply heat to muscles that are tense or tight. Do this before you exercise, or as often as told by your health care provider. Use the heat source that your health care provider recommends, such as a moist heat pack or a heating pad. To do this: Place a towel between your skin and the heat source. Leave the heat on for 20-30 minutes. Remove the heat if your  skin turns bright red. This is especially important if you are unable to feel pain, heat, or cold. You may have a greater risk of getting burned. Try taking hot showers or baths to help relax tight muscles. General instructions If you are having frequent leg cramps, avoid intense exercise for several days. Take over-the-counter and prescription medicines only as told by your health care provider. Keep all follow-up visits. This is important. Contact a health care provider if: Your leg cramps get more severe or more frequent, or they do not improve over time. Your foot becomes cold, numb, or blue. Summary Muscle cramps can develop in any muscle, but the most common place is in the calf muscles of the leg. Leg cramps are painful, and they may last for a few seconds to a few minutes. Usually, leg cramps are not caused by a serious medical problem. Often, the cause is not known. Stay hydrated, and take over-the-counter and prescription medicines only as told by your health care provider. This information is not intended to replace advice given to you by your health care provider. Make sure you discuss any questions you have with your health care provider. Document Revised: 01/09/2020 Document Reviewed: 01/09/2020 Elsevier Patient Education  [place].

## 2021-08-18 ENCOUNTER — Encounter (INDEPENDENT_AMBULATORY_CARE_PROVIDER_SITE_OTHER): Payer: Medicare PPO | Admitting: Ophthalmology

## 2021-08-18 ENCOUNTER — Ambulatory Visit (INDEPENDENT_AMBULATORY_CARE_PROVIDER_SITE_OTHER): Payer: Medicare PPO

## 2021-08-18 ENCOUNTER — Other Ambulatory Visit: Payer: Self-pay

## 2021-08-18 DIAGNOSIS — H43813 Vitreous degeneration, bilateral: Secondary | ICD-10-CM | POA: Diagnosis not present

## 2021-08-18 DIAGNOSIS — H35033 Hypertensive retinopathy, bilateral: Secondary | ICD-10-CM

## 2021-08-18 DIAGNOSIS — I10 Essential (primary) hypertension: Secondary | ICD-10-CM

## 2021-08-18 DIAGNOSIS — I442 Atrioventricular block, complete: Secondary | ICD-10-CM | POA: Diagnosis not present

## 2021-08-18 DIAGNOSIS — H353231 Exudative age-related macular degeneration, bilateral, with active choroidal neovascularization: Secondary | ICD-10-CM

## 2021-08-18 DIAGNOSIS — D3131 Benign neoplasm of right choroid: Secondary | ICD-10-CM

## 2021-08-19 LAB — CUP PACEART REMOTE DEVICE CHECK
Battery Impedance: 661 Ohm
Battery Remaining Longevity: 78 mo
Battery Voltage: 2.78 V
Brady Statistic AP VP Percent: 19 %
Brady Statistic AP VS Percent: 0 %
Brady Statistic AS VP Percent: 81 %
Brady Statistic AS VS Percent: 1 %
Date Time Interrogation Session: 20221214113302
Implantable Lead Implant Date: 20160512
Implantable Lead Implant Date: 20160512
Implantable Lead Location: 753859
Implantable Lead Location: 753860
Implantable Lead Model: 5076
Implantable Lead Model: 5092
Implantable Pulse Generator Implant Date: 20160512
Lead Channel Impedance Value: 452 Ohm
Lead Channel Impedance Value: 849 Ohm
Lead Channel Pacing Threshold Amplitude: 0.75 V
Lead Channel Pacing Threshold Amplitude: 0.875 V
Lead Channel Pacing Threshold Pulse Width: 0.4 ms
Lead Channel Pacing Threshold Pulse Width: 0.4 ms
Lead Channel Setting Pacing Amplitude: 2 V
Lead Channel Setting Pacing Amplitude: 2.5 V
Lead Channel Setting Pacing Pulse Width: 0.4 ms
Lead Channel Setting Sensing Sensitivity: 4 mV

## 2021-08-27 NOTE — Progress Notes (Signed)
Remote pacemaker transmission.   

## 2021-09-15 ENCOUNTER — Encounter (INDEPENDENT_AMBULATORY_CARE_PROVIDER_SITE_OTHER): Payer: Medicare PPO | Admitting: Ophthalmology

## 2021-09-15 ENCOUNTER — Other Ambulatory Visit: Payer: Self-pay

## 2021-09-15 DIAGNOSIS — H43813 Vitreous degeneration, bilateral: Secondary | ICD-10-CM | POA: Diagnosis not present

## 2021-09-15 DIAGNOSIS — H35033 Hypertensive retinopathy, bilateral: Secondary | ICD-10-CM | POA: Diagnosis not present

## 2021-09-15 DIAGNOSIS — I10 Essential (primary) hypertension: Secondary | ICD-10-CM

## 2021-09-15 DIAGNOSIS — D3131 Benign neoplasm of right choroid: Secondary | ICD-10-CM

## 2021-09-15 DIAGNOSIS — H353231 Exudative age-related macular degeneration, bilateral, with active choroidal neovascularization: Secondary | ICD-10-CM | POA: Diagnosis not present

## 2021-09-17 ENCOUNTER — Telehealth: Payer: Self-pay | Admitting: Cardiology

## 2021-09-17 NOTE — Telephone Encounter (Signed)
Pt c/o BP issue: STAT if pt c/o blurred vision, one-sided weakness or slurred speech  1. What are your last 5 BP readings?  1/11  4:45pm  189/113  5:45pm 160/101  6:45pm 185/109  8:45pm 182/101 9:47pm 171/105 11:15pm 113/82 This morning 175/101    2. Are you having any other symptoms (ex. Dizziness, headache, blurred vision, passed out)? A little bit of a headache, but not much. Has little of pain under her left shoulder blade, feels more like gas took an anti-acid pain went away.  3. What is your BP issue? BP is high. Patient states that she had Avastin eye shots on 1/10.

## 2021-09-17 NOTE — Telephone Encounter (Signed)
Patient called to check on status of call.  °

## 2021-09-17 NOTE — Telephone Encounter (Signed)
Spoke to patient she stated her B/P has been elevated.She called 911 last night but she did not have to go to ED.Stated she received eye injections last week for macular degeneration.She thinks that injection might be why her B/P is elevated.She took 1/2 of a Xanax.Stated B/P at present 152/93.Appointment scheduled with Dr.Hochrein tomorrow 1/13 at 8:30 am.Advised to bring a list of all medications and a list of B/P readings.

## 2021-09-17 NOTE — Progress Notes (Signed)
Cardiology Office Note   Date:  09/18/2021   ID:  [name] [name], DOB 1936-01-07, MRN [id number]  PCP:  [name], MD  Cardiologist:   [name], MD   Chief Complaint  Patient presents with   Hypertension    History of Present Illness: [name] [name] is a 86 y.o. female who presents for evaluation of difficult to control hypertension.  She had heart block and did have a pacemaker placed.  She called recently with BPs as which are typically above systolic 546 and diastolics of 270.   She was added to my schedule.    She said that she had gotten Avastin in her eyes which she does every 4 weeks and she is now consistently seeing that she gets a hypertensive response going to those procedures and when she gets home.  As would happen with her blood pressure where it went up above 200 and she became very anxious.  EMS checked throughout and did not suggest that she needed transport.  She has been pleased because her blood pressure wise has been doing okay.  She has not been having any chest pressure, neck or arm discomfort.  She had not been having any new shortness of breath, PND or orthopnea.  She had a couple of low blood pressures but none of the significant drops or dizziness that she had been having.  She gets around her house and uses a cane outside.   Past Medical History:  Diagnosis Date   Anxiety    Broken arm Right   Broken leg    Complete heart block (HCC)    Hypertension    Postural dizziness     Past Surgical History:  Procedure Laterality Date   EP IMPLANTABLE DEVICE N/A 01/16/2015   MDT Adapta L PPM implanted by Dr Rayann Heman for complete heart block   IR GENERIC HISTORICAL  09/15/2016   IR RADIOLOGIST EVAL & MGMT 09/15/2016 [facility]   IR GENERIC HISTORICAL  09/28/2016   IR KYPHO THORACIC WITH BONE BIOPSY 09/28/2016 Luanne Bras, MD [facility]   IR GENERIC HISTORICAL  10/14/2016   IR  RADIOLOGIST EVAL & MGMT 10/14/2016 [facility]   PACEMAKER INSERTION N/A 01/2015     Current Outpatient Medications  Medication Sig Dispense Refill   acetaminophen (TYLENOL) 500 MG tablet Take 500 mg by mouth every 6 (six) hours as needed for mild pain.     ALPRAZolam (XANAX) 0.25 MG tablet Take 0.125 tablets by mouth daily as needed for anxiety.      aspirin 325 MG tablet Take 650 mg by mouth at bedtime as needed for moderate pain. Will take at bedtime if needed for pain     aspirin 81 MG tablet Take 81 mg by mouth daily.     Besifloxacin HCl 0.6 % SUSP Apply 1 drop to eye 4 (four) times daily. For 2 days after injection     calcium carbonate (TUMS EX) 750 MG chewable tablet Chew 1 tablet by mouth 2 (two) times daily as needed.      Cholecalciferol (VITAMIN D3) 2000 units TABS Take 1 tablet by mouth daily.     famotidine (PEPCID) 20 MG tablet Take 10-20 mg by mouth daily.      fluticasone (FLONASE) 50 MCG/ACT nasal spray Place 2 sprays into both nostrils daily as needed for allergies.      hydrALAZINE (APRESOLINE) 10 MG tablet Take 10 mg three times a day only if Systolic blood pressure greater than 180 90  tablet 6   lidocaine (LIDODERM) 5 % Place 1 patch onto the skin daily as needed. Remove & Discard patch within 12 hours or as directed by MD     Magnesium Hydroxide (PHILLIPS MILK OF MAGNESIA PO) Take 1-2 tablets by mouth 2 (two) times daily as needed.     Multiple Vitamins-Minerals (ICAPS MV PO) Take 1 tablet by mouth daily.     PRESCRIPTION MEDICATION Place into both eyes every 30 (thirty) days. Avastin injection in right eye, Ilea in left eye every 4 weeks by Dr Zigmund Daniel     No current facility-administered medications for this visit.    Allergies:   Hctz [hydrochlorothiazide], Losartan, Macrolides and ketolides, Amlodipine, Benazepril, and Penicillins    ROS:  Please see the history of present illness.   Otherwise, review of systems are positive for none.   All other systems are  reviewed and negative.    PHYSICAL EXAM: VS:  BP 139/80    Pulse 81    Ht 5\' 4"  (1.626 m)    Wt 178 lb 9.6 oz (81 kg)    SpO2 98%    BMI 30.66 kg/m  , BMI Body mass index is 30.66 kg/m.  GENERAL:  Well appearing NECK:  No jugular venous distention, waveform within normal limits, carotid upstroke brisk and symmetric, no bruits, no thyromegaly LUNGS:  Clear to auscultation bilaterally CHEST:  Unremarkable HEART:  PMI not displaced or sustained,S1 and S2 within normal limits, no S3, no S4, no clicks, no rubs, no murmurs ABD:  Flat, positive bowel sounds normal in frequency in pitch, no bruits, no rebound, no guarding, no midline pulsatile mass, no hepatomegaly, no splenomegaly EXT:  2 plus pulses throughout, no edema, no cyanosis no clubbing   EKG:  EKG is  ordered today. Sinus rhythm with first-degree AV block and ventricular pacing 1% capture, rate 80   Recent Labs: 10/28/2020: BUN 16; Creatinine, Ser 0.88; Potassium 4.8; Sodium 141      Wt Readings from Last 3 Encounters:  09/18/21 178 lb 9.6 oz (81 kg)  08/11/21 176 lb 12.8 oz (80.2 kg)  01/27/21 172 lb (78 kg)      Other studies Reviewed: Additional studies/ records that were reviewed today include:  Review of the above records demonstrates:   See elsewhere   ASSESSMENT AND PLAN:   Complete heart block/Status post pacemaker She is up-to-date with follow-up.    HTN Her blood pressure is labile.  He had an appointment today and will give her hydralazine 10 mg every 8 hours as needed.  She is given instructions to take this if her systolic blood pressures greater than 180.  She is also given the diameters have to take it below 110.  She understands is for hypertensive urgency.  Otherwise she will continue the meds as listed.  AS This was mild in Nov 2021.  No change in therapy.    Dizziness:   This is much improved.  No change in therapy.    Current medicines are reviewed at length with the patient today.  The  patient does not have concerns regarding medicines.  The following changes have been made:   As above  Labs/ tests ordered today include:   None  Orders Placed This Encounter  Procedures   EKG 12-Lead     Disposition:   FU with me  in 6 months.   Signed, [name], MD  09/18/2021 9:58 AM    [facility]

## 2021-09-18 ENCOUNTER — Encounter: Payer: Self-pay | Admitting: Cardiology

## 2021-09-18 ENCOUNTER — Ambulatory Visit: Payer: Medicare PPO | Admitting: Cardiology

## 2021-09-18 ENCOUNTER — Other Ambulatory Visit: Payer: Self-pay

## 2021-09-18 VITALS — BP 139/80 | HR 81 | Ht 64.0 in | Wt 178.6 lb

## 2021-09-18 DIAGNOSIS — I442 Atrioventricular block, complete: Secondary | ICD-10-CM

## 2021-09-18 DIAGNOSIS — I10 Essential (primary) hypertension: Secondary | ICD-10-CM

## 2021-09-18 MED ORDER — HYDRALAZINE HCL 10 MG PO TABS
ORAL_TABLET | ORAL | 6 refills | Status: DC
Start: 2021-09-18 — End: 2023-12-19

## 2021-09-18 NOTE — Patient Instructions (Signed)
Medication Instructions:  Take Hydralazine 10 mg 3 times a day only if Systolic blood pressure greater than 180  Continue all other medications  *If you need a refill on your cardiac medications before your next appointment, please call your pharmacy*   Lab Work: None ordered   Testing/Procedures: None ordered   Follow-Up: At [facility], you and your health needs are our priority.  As part of our continuing mission to provide you with exceptional heart care, we have created designated Provider Care Teams.  These Care Teams include your primary Cardiologist (physician) and Advanced Practice Providers (APPs -  Physician Assistants and Nurse Practitioners) who all work together to provide you with the care you need, when you need it.  We recommend signing up for the patient portal called "MyChart".  Sign up information is provided on this After Visit Summary.  MyChart is used to connect with patients for Virtual Visits (Telemedicine).  Patients are able to view lab/test results, encounter notes, upcoming appointments, etc.  Non-urgent messages can be sent to your provider as well.   To learn more about what you can do with MyChart, go to [email or web address].      Your next appointment:  6 months   Call in April to schedule July appointment    The format for your next appointment: Office   Provider:  Dr.Hochrein

## 2021-09-30 ENCOUNTER — Telehealth: Payer: Self-pay | Admitting: Cardiology

## 2021-09-30 NOTE — Telephone Encounter (Signed)
Pt informed of providers result & recommendations. Pt verbalized understanding. No further questions . ? ?

## 2021-09-30 NOTE — Telephone Encounter (Signed)
Returned call to pt she states that she took her BP she states that she feels jittery and afraid to walk as she thinks she will fall, at about 10am 185/104 then she took the hydralazine at 11am then at 12:54pm 154/99 then now at 220pm BP is 135/99. And she is felling better now. She states that "mostly" her BP is fine 121/84 but usually not any higher than usually 140/89's. She is asking how long will she have to do this? She states that she wants to know if she can take her Xanax with the Hydralazine? Since she is feeling jittery? She thinks that this will help. Informed pt to try to take her BP earlier that 11am so if she needs to take her Hydralazine it will be earlier and to take again when she is feeling these sx again.

## 2021-09-30 NOTE — Telephone Encounter (Signed)
Pt c/o medication issue:  1. Name of Medication:  hydrALAZINE (APRESOLINE) 10 MG tablet ALPRAZolam (XANAX) 0.25 MG tablet  2. How are you currently taking this medication (dosage and times per day)? Hydralazine 3x a day if BP is over 180, xanax as needed  3. Are you having a reaction (difficulty breathing--STAT)? no  4. What is your medication issue? Patient states she took a hydralazine for her BP, because it was over 180. She says it did go back down, but would like to know if it stays up can she take a xanax as well.

## 2021-10-13 DIAGNOSIS — H35033 Hypertensive retinopathy, bilateral: Secondary | ICD-10-CM | POA: Diagnosis not present

## 2021-10-13 DIAGNOSIS — H2513 Age-related nuclear cataract, bilateral: Secondary | ICD-10-CM | POA: Diagnosis not present

## 2021-10-13 DIAGNOSIS — H353231 Exudative age-related macular degeneration, bilateral, with active choroidal neovascularization: Secondary | ICD-10-CM | POA: Diagnosis not present

## 2021-10-13 DIAGNOSIS — H04123 Dry eye syndrome of bilateral lacrimal glands: Secondary | ICD-10-CM | POA: Diagnosis not present

## 2021-10-14 ENCOUNTER — Encounter (INDEPENDENT_AMBULATORY_CARE_PROVIDER_SITE_OTHER): Payer: Medicare PPO | Admitting: Ophthalmology

## 2021-10-14 ENCOUNTER — Other Ambulatory Visit: Payer: Self-pay

## 2021-10-14 DIAGNOSIS — I10 Essential (primary) hypertension: Secondary | ICD-10-CM

## 2021-10-14 DIAGNOSIS — H43813 Vitreous degeneration, bilateral: Secondary | ICD-10-CM | POA: Diagnosis not present

## 2021-10-14 DIAGNOSIS — H35033 Hypertensive retinopathy, bilateral: Secondary | ICD-10-CM

## 2021-10-14 DIAGNOSIS — H353231 Exudative age-related macular degeneration, bilateral, with active choroidal neovascularization: Secondary | ICD-10-CM | POA: Diagnosis not present

## 2021-10-14 DIAGNOSIS — D3131 Benign neoplasm of right choroid: Secondary | ICD-10-CM | POA: Diagnosis not present

## 2021-10-21 ENCOUNTER — Telehealth: Payer: Self-pay | Admitting: Cardiology

## 2021-10-21 NOTE — Telephone Encounter (Signed)
Patient is calling stating that she mailed her BP readings to the office on 02/08 to the attention of Dr. Percival Spanish. She is wanting to know if they were received and what her next steps should be.

## 2021-10-21 NOTE — Telephone Encounter (Signed)
Called patient back. She states she mailed some readings to Dr.Hochrein to review.   I checked his mailbox today and did not see anything for this patient there, will route to covering RN to see if she already has it, and if not, we can be on the look.   Patient verbalized understanding, thankful for call back.

## 2021-10-22 NOTE — Telephone Encounter (Signed)
Spoke with pt, aware we have the blood pressure reading but dr hochrein is working in the hospital this week and has not seen them. Will be back in touch with her once he has reviewed.

## 2021-10-27 DIAGNOSIS — F419 Anxiety disorder, unspecified: Secondary | ICD-10-CM | POA: Diagnosis not present

## 2021-10-27 DIAGNOSIS — R0609 Other forms of dyspnea: Secondary | ICD-10-CM | POA: Diagnosis not present

## 2021-10-27 DIAGNOSIS — R55 Syncope and collapse: Secondary | ICD-10-CM | POA: Diagnosis not present

## 2021-10-27 DIAGNOSIS — Z23 Encounter for immunization: Secondary | ICD-10-CM | POA: Diagnosis not present

## 2021-10-27 DIAGNOSIS — E559 Vitamin D deficiency, unspecified: Secondary | ICD-10-CM | POA: Diagnosis not present

## 2021-10-27 DIAGNOSIS — G4700 Insomnia, unspecified: Secondary | ICD-10-CM | POA: Diagnosis not present

## 2021-10-27 DIAGNOSIS — I10 Essential (primary) hypertension: Secondary | ICD-10-CM | POA: Diagnosis not present

## 2021-10-27 DIAGNOSIS — R5382 Chronic fatigue, unspecified: Secondary | ICD-10-CM | POA: Diagnosis not present

## 2021-10-27 DIAGNOSIS — Z950 Presence of cardiac pacemaker: Secondary | ICD-10-CM | POA: Diagnosis not present

## 2021-10-27 DIAGNOSIS — Z0001 Encounter for general adult medical examination with abnormal findings: Secondary | ICD-10-CM | POA: Diagnosis not present

## 2021-10-30 NOTE — Telephone Encounter (Signed)
Per dr hochrein's review of blood pressure readings, patient instructed to take her bp 3-4 times a week at random time. He is not making changes in medications based on the readings provided by the patient. Patient given okay to take blood pressure if she feels bad and she can continue the as needed hydralazine. Patient voiced understanding

## 2021-11-02 DIAGNOSIS — H35033 Hypertensive retinopathy, bilateral: Secondary | ICD-10-CM | POA: Diagnosis not present

## 2021-11-02 DIAGNOSIS — H353231 Exudative age-related macular degeneration, bilateral, with active choroidal neovascularization: Secondary | ICD-10-CM | POA: Diagnosis not present

## 2021-11-02 DIAGNOSIS — H04123 Dry eye syndrome of bilateral lacrimal glands: Secondary | ICD-10-CM | POA: Diagnosis not present

## 2021-11-02 DIAGNOSIS — H2513 Age-related nuclear cataract, bilateral: Secondary | ICD-10-CM | POA: Diagnosis not present

## 2021-11-11 ENCOUNTER — Encounter (INDEPENDENT_AMBULATORY_CARE_PROVIDER_SITE_OTHER): Payer: Medicare PPO | Admitting: Ophthalmology

## 2021-11-11 ENCOUNTER — Other Ambulatory Visit: Payer: Self-pay

## 2021-11-11 DIAGNOSIS — H353231 Exudative age-related macular degeneration, bilateral, with active choroidal neovascularization: Secondary | ICD-10-CM

## 2021-11-11 DIAGNOSIS — I10 Essential (primary) hypertension: Secondary | ICD-10-CM

## 2021-11-11 DIAGNOSIS — H35033 Hypertensive retinopathy, bilateral: Secondary | ICD-10-CM

## 2021-11-11 DIAGNOSIS — H43813 Vitreous degeneration, bilateral: Secondary | ICD-10-CM | POA: Diagnosis not present

## 2021-11-11 DIAGNOSIS — D3131 Benign neoplasm of right choroid: Secondary | ICD-10-CM

## 2021-11-17 ENCOUNTER — Ambulatory Visit (INDEPENDENT_AMBULATORY_CARE_PROVIDER_SITE_OTHER): Payer: Medicare PPO

## 2021-11-17 DIAGNOSIS — I442 Atrioventricular block, complete: Secondary | ICD-10-CM | POA: Diagnosis not present

## 2021-11-17 LAB — CUP PACEART REMOTE DEVICE CHECK
Battery Impedance: 736 Ohm
Battery Remaining Longevity: 73 mo
Battery Voltage: 2.78 V
Brady Statistic AP VP Percent: 17 %
Brady Statistic AP VS Percent: 0 %
Brady Statistic AS VP Percent: 82 %
Brady Statistic AS VS Percent: 1 %
Date Time Interrogation Session: 20230314131457
Implantable Lead Implant Date: 20160512
Implantable Lead Implant Date: 20160512
Implantable Lead Location: 753859
Implantable Lead Location: 753860
Implantable Lead Model: 5076
Implantable Lead Model: 5092
Implantable Pulse Generator Implant Date: 20160512
Lead Channel Impedance Value: 433 Ohm
Lead Channel Impedance Value: 798 Ohm
Lead Channel Pacing Threshold Amplitude: 0.625 V
Lead Channel Pacing Threshold Amplitude: 0.75 V
Lead Channel Pacing Threshold Pulse Width: 0.4 ms
Lead Channel Pacing Threshold Pulse Width: 0.4 ms
Lead Channel Setting Pacing Amplitude: 2 V
Lead Channel Setting Pacing Amplitude: 2.5 V
Lead Channel Setting Pacing Pulse Width: 0.4 ms
Lead Channel Setting Sensing Sensitivity: 4 mV

## 2021-11-23 DIAGNOSIS — H2512 Age-related nuclear cataract, left eye: Secondary | ICD-10-CM | POA: Diagnosis not present

## 2021-11-23 DIAGNOSIS — H2513 Age-related nuclear cataract, bilateral: Secondary | ICD-10-CM | POA: Diagnosis not present

## 2021-11-27 ENCOUNTER — Telehealth: Payer: Self-pay | Admitting: *Deleted

## 2021-11-27 NOTE — Telephone Encounter (Signed)
? ?  Patient Name: [name] [name]  ?DOB: 1936-05-13 ?MRN: [id number] ? ?Primary Cardiologist: [name], MD ? ?Chart reviewed as part of pre-operative protocol coverage. Cataract extractions are recognized in guidelines as low risk surgeries that do not typically require specific preoperative testing or holding of blood thinner therapy. Therefore, given past medical history and time since last visit, based on ACC/AHA guidelines, [name] [name] would be at acceptable risk for the planned procedure without further cardiovascular testing.  ? ?I will route this recommendation to the requesting party via Epic fax function and remove from pre-op pool. ? ?Please call with questions. ? ?Deberah Pelton, NP ?11/27/2021, 3:03 PM ? ?

## 2021-11-27 NOTE — Telephone Encounter (Signed)
? ?  Pre-operative Risk Assessment  ?  ?Patient Name: [name] [name]  ?DOB: 08-Feb-1936 ?MRN: [id number]  ? ?  ? ?Request for Surgical Clearance   ? ?Procedure:  CATARACT SURGERY ? ?Date of Surgery:  Clearance 12/29/21  AND 01/26/22                            ?   ?Surgeon:   ?Surgeon's Group or Practice Name:  [facility] ?Phone number:   ?Fax number:  [phone] ?  ?Type of Clearance Requested:   ?- Medical  ?  ?Type of Anesthesia:  Not Indicated ?  ?Additional requests/questions:   ?Signed, ?Fredia Beets   ?11/27/2021, 2:45 PM   ?

## 2021-12-01 NOTE — Progress Notes (Signed)
Remote pacemaker transmission.   

## 2021-12-09 ENCOUNTER — Encounter (INDEPENDENT_AMBULATORY_CARE_PROVIDER_SITE_OTHER): Payer: Medicare PPO | Admitting: Ophthalmology

## 2021-12-09 DIAGNOSIS — H353231 Exudative age-related macular degeneration, bilateral, with active choroidal neovascularization: Secondary | ICD-10-CM

## 2021-12-09 DIAGNOSIS — H2513 Age-related nuclear cataract, bilateral: Secondary | ICD-10-CM

## 2021-12-09 DIAGNOSIS — H35033 Hypertensive retinopathy, bilateral: Secondary | ICD-10-CM

## 2021-12-09 DIAGNOSIS — I10 Essential (primary) hypertension: Secondary | ICD-10-CM

## 2021-12-09 DIAGNOSIS — D3131 Benign neoplasm of right choroid: Secondary | ICD-10-CM

## 2021-12-09 DIAGNOSIS — H43813 Vitreous degeneration, bilateral: Secondary | ICD-10-CM | POA: Diagnosis not present

## 2021-12-15 DIAGNOSIS — M25562 Pain in left knee: Secondary | ICD-10-CM | POA: Diagnosis not present

## 2021-12-15 DIAGNOSIS — I10 Essential (primary) hypertension: Secondary | ICD-10-CM | POA: Diagnosis not present

## 2021-12-15 DIAGNOSIS — M810 Age-related osteoporosis without current pathological fracture: Secondary | ICD-10-CM | POA: Diagnosis not present

## 2021-12-15 DIAGNOSIS — S82102A Unspecified fracture of upper end of left tibia, initial encounter for closed fracture: Secondary | ICD-10-CM | POA: Diagnosis not present

## 2021-12-15 DIAGNOSIS — S82142A Displaced bicondylar fracture of left tibia, initial encounter for closed fracture: Secondary | ICD-10-CM | POA: Diagnosis not present

## 2021-12-15 DIAGNOSIS — R52 Pain, unspecified: Secondary | ICD-10-CM | POA: Diagnosis not present

## 2021-12-15 DIAGNOSIS — S82122A Displaced fracture of lateral condyle of left tibia, initial encounter for closed fracture: Secondary | ICD-10-CM | POA: Diagnosis not present

## 2021-12-15 DIAGNOSIS — K219 Gastro-esophageal reflux disease without esophagitis: Secondary | ICD-10-CM | POA: Diagnosis not present

## 2021-12-16 DIAGNOSIS — S82142A Displaced bicondylar fracture of left tibia, initial encounter for closed fracture: Secondary | ICD-10-CM | POA: Diagnosis not present

## 2021-12-16 DIAGNOSIS — I10 Essential (primary) hypertension: Secondary | ICD-10-CM | POA: Diagnosis not present

## 2021-12-16 DIAGNOSIS — K219 Gastro-esophageal reflux disease without esophagitis: Secondary | ICD-10-CM | POA: Diagnosis not present

## 2021-12-16 DIAGNOSIS — R52 Pain, unspecified: Secondary | ICD-10-CM | POA: Diagnosis not present

## 2021-12-17 DIAGNOSIS — R5381 Other malaise: Secondary | ICD-10-CM | POA: Diagnosis not present

## 2021-12-17 DIAGNOSIS — R531 Weakness: Secondary | ICD-10-CM | POA: Diagnosis not present

## 2021-12-17 DIAGNOSIS — R52 Pain, unspecified: Secondary | ICD-10-CM | POA: Diagnosis not present

## 2021-12-17 DIAGNOSIS — K219 Gastro-esophageal reflux disease without esophagitis: Secondary | ICD-10-CM | POA: Diagnosis not present

## 2021-12-17 DIAGNOSIS — Z7409 Other reduced mobility: Secondary | ICD-10-CM | POA: Diagnosis not present

## 2021-12-17 DIAGNOSIS — R269 Unspecified abnormalities of gait and mobility: Secondary | ICD-10-CM | POA: Diagnosis not present

## 2021-12-17 DIAGNOSIS — I10 Essential (primary) hypertension: Secondary | ICD-10-CM | POA: Diagnosis not present

## 2021-12-17 DIAGNOSIS — Z789 Other specified health status: Secondary | ICD-10-CM | POA: Diagnosis not present

## 2021-12-17 DIAGNOSIS — S82142A Displaced bicondylar fracture of left tibia, initial encounter for closed fracture: Secondary | ICD-10-CM | POA: Diagnosis not present

## 2021-12-18 DIAGNOSIS — R52 Pain, unspecified: Secondary | ICD-10-CM | POA: Diagnosis not present

## 2021-12-18 DIAGNOSIS — R531 Weakness: Secondary | ICD-10-CM | POA: Diagnosis not present

## 2021-12-18 DIAGNOSIS — K219 Gastro-esophageal reflux disease without esophagitis: Secondary | ICD-10-CM | POA: Diagnosis not present

## 2021-12-18 DIAGNOSIS — R5381 Other malaise: Secondary | ICD-10-CM | POA: Diagnosis not present

## 2021-12-18 DIAGNOSIS — Z789 Other specified health status: Secondary | ICD-10-CM | POA: Diagnosis not present

## 2021-12-18 DIAGNOSIS — I10 Essential (primary) hypertension: Secondary | ICD-10-CM | POA: Diagnosis not present

## 2021-12-18 DIAGNOSIS — Z7409 Other reduced mobility: Secondary | ICD-10-CM | POA: Diagnosis not present

## 2021-12-18 DIAGNOSIS — S82142A Displaced bicondylar fracture of left tibia, initial encounter for closed fracture: Secondary | ICD-10-CM | POA: Diagnosis not present

## 2021-12-18 DIAGNOSIS — R269 Unspecified abnormalities of gait and mobility: Secondary | ICD-10-CM | POA: Diagnosis not present

## 2021-12-19 DIAGNOSIS — K219 Gastro-esophageal reflux disease without esophagitis: Secondary | ICD-10-CM | POA: Diagnosis not present

## 2021-12-19 DIAGNOSIS — S82142A Displaced bicondylar fracture of left tibia, initial encounter for closed fracture: Secondary | ICD-10-CM | POA: Diagnosis not present

## 2021-12-19 DIAGNOSIS — R52 Pain, unspecified: Secondary | ICD-10-CM | POA: Diagnosis not present

## 2021-12-19 DIAGNOSIS — I10 Essential (primary) hypertension: Secondary | ICD-10-CM | POA: Diagnosis not present

## 2021-12-20 DIAGNOSIS — I10 Essential (primary) hypertension: Secondary | ICD-10-CM | POA: Diagnosis not present

## 2021-12-20 DIAGNOSIS — K219 Gastro-esophageal reflux disease without esophagitis: Secondary | ICD-10-CM | POA: Diagnosis not present

## 2021-12-20 DIAGNOSIS — R52 Pain, unspecified: Secondary | ICD-10-CM | POA: Diagnosis not present

## 2021-12-20 DIAGNOSIS — S82142A Displaced bicondylar fracture of left tibia, initial encounter for closed fracture: Secondary | ICD-10-CM | POA: Diagnosis not present

## 2021-12-21 DIAGNOSIS — K219 Gastro-esophageal reflux disease without esophagitis: Secondary | ICD-10-CM | POA: Diagnosis not present

## 2021-12-21 DIAGNOSIS — S82142A Displaced bicondylar fracture of left tibia, initial encounter for closed fracture: Secondary | ICD-10-CM | POA: Diagnosis not present

## 2021-12-21 DIAGNOSIS — R52 Pain, unspecified: Secondary | ICD-10-CM | POA: Diagnosis not present

## 2021-12-21 DIAGNOSIS — I10 Essential (primary) hypertension: Secondary | ICD-10-CM | POA: Diagnosis not present

## 2021-12-22 DIAGNOSIS — S82142A Displaced bicondylar fracture of left tibia, initial encounter for closed fracture: Secondary | ICD-10-CM | POA: Diagnosis not present

## 2021-12-22 DIAGNOSIS — I10 Essential (primary) hypertension: Secondary | ICD-10-CM | POA: Diagnosis not present

## 2021-12-22 DIAGNOSIS — K219 Gastro-esophageal reflux disease without esophagitis: Secondary | ICD-10-CM | POA: Diagnosis not present

## 2021-12-22 DIAGNOSIS — R52 Pain, unspecified: Secondary | ICD-10-CM | POA: Diagnosis not present

## 2021-12-23 ENCOUNTER — Encounter: Payer: Medicare PPO | Admitting: Internal Medicine

## 2021-12-23 DIAGNOSIS — S82122D Displaced fracture of lateral condyle of left tibia, subsequent encounter for closed fracture with routine healing: Secondary | ICD-10-CM | POA: Diagnosis not present

## 2021-12-23 DIAGNOSIS — I10 Essential (primary) hypertension: Secondary | ICD-10-CM | POA: Diagnosis not present

## 2021-12-23 DIAGNOSIS — R69 Illness, unspecified: Secondary | ICD-10-CM | POA: Diagnosis not present

## 2021-12-23 DIAGNOSIS — E119 Type 2 diabetes mellitus without complications: Secondary | ICD-10-CM | POA: Diagnosis not present

## 2021-12-23 DIAGNOSIS — Z743 Need for continuous supervision: Secondary | ICD-10-CM | POA: Diagnosis not present

## 2021-12-23 DIAGNOSIS — S82122A Displaced fracture of lateral condyle of left tibia, initial encounter for closed fracture: Secondary | ICD-10-CM | POA: Diagnosis not present

## 2021-12-23 DIAGNOSIS — H2511 Age-related nuclear cataract, right eye: Secondary | ICD-10-CM | POA: Diagnosis not present

## 2021-12-23 DIAGNOSIS — Z950 Presence of cardiac pacemaker: Secondary | ICD-10-CM | POA: Diagnosis not present

## 2021-12-23 DIAGNOSIS — H2512 Age-related nuclear cataract, left eye: Secondary | ICD-10-CM | POA: Diagnosis not present

## 2021-12-23 DIAGNOSIS — S8992XA Unspecified injury of left lower leg, initial encounter: Secondary | ICD-10-CM | POA: Diagnosis not present

## 2021-12-23 DIAGNOSIS — I495 Sick sinus syndrome: Secondary | ICD-10-CM | POA: Diagnosis not present

## 2021-12-23 DIAGNOSIS — K219 Gastro-esophageal reflux disease without esophagitis: Secondary | ICD-10-CM | POA: Diagnosis not present

## 2021-12-23 DIAGNOSIS — R52 Pain, unspecified: Secondary | ICD-10-CM | POA: Diagnosis not present

## 2021-12-23 DIAGNOSIS — S82142A Displaced bicondylar fracture of left tibia, initial encounter for closed fracture: Secondary | ICD-10-CM | POA: Diagnosis not present

## 2021-12-23 DIAGNOSIS — M6281 Muscle weakness (generalized): Secondary | ICD-10-CM | POA: Diagnosis not present

## 2021-12-29 DIAGNOSIS — H2511 Age-related nuclear cataract, right eye: Secondary | ICD-10-CM | POA: Diagnosis not present

## 2021-12-31 ENCOUNTER — Ambulatory Visit (INDEPENDENT_AMBULATORY_CARE_PROVIDER_SITE_OTHER): Payer: Medicare PPO | Admitting: Orthopaedic Surgery

## 2021-12-31 ENCOUNTER — Encounter: Payer: Self-pay | Admitting: Orthopaedic Surgery

## 2021-12-31 ENCOUNTER — Ambulatory Visit (INDEPENDENT_AMBULATORY_CARE_PROVIDER_SITE_OTHER): Payer: Medicare PPO

## 2021-12-31 DIAGNOSIS — S8992XA Unspecified injury of left lower leg, initial encounter: Secondary | ICD-10-CM

## 2021-12-31 DIAGNOSIS — S82122D Displaced fracture of lateral condyle of left tibia, subsequent encounter for closed fracture with routine healing: Secondary | ICD-10-CM | POA: Diagnosis not present

## 2021-12-31 NOTE — Progress Notes (Signed)
The patient is an 86 year old female who sustained a left lateral tibial plateau fracture which is nondisplaced.  This happened about 2 and half weeks ago when she was in [facility] and twisted her knee.  There was no fall associated with this.  An orthopedic surgeon down there appropriately has had a nonweightbearing and placed her in a Bledsoe hinged knee brace unlocked to allow lection to 90 degrees.  She is staying in a skilled nursing facility since she cannot go home yet since she has been nonweightbearing and has a take care of an elderly husband.  She reports only mild pain. ? ?I did remove the hinged knee brace.  Her knee is minimally swollen with bruising.  Her extensor mechanism is intact and she did let me gently flex and extend her left knee. ? ?An AP and lateral of the left tibia are obtained and show a nondisplaced proximal lateral tibial plateau fracture. ? ?She does not need the brace at this standpoint.  She can touchdown weight-bear for only the next 2 weeks.  She then will increase to 50% weightbearing and I did fill out a note for the skilled nursing facility that recognizes this.  I would like to see her back in 3 weeks with an AP and lateral of her left knee.  This should be done supine.  At that point if the fracture is looking good we will increase her to full weightbearing if possible.  All questions and concerns were answered and addressed. ?

## 2022-01-04 DIAGNOSIS — H2512 Age-related nuclear cataract, left eye: Secondary | ICD-10-CM | POA: Diagnosis not present

## 2022-01-06 ENCOUNTER — Encounter (INDEPENDENT_AMBULATORY_CARE_PROVIDER_SITE_OTHER): Payer: Medicare PPO | Admitting: Ophthalmology

## 2022-01-06 DIAGNOSIS — H353231 Exudative age-related macular degeneration, bilateral, with active choroidal neovascularization: Secondary | ICD-10-CM

## 2022-01-06 DIAGNOSIS — I10 Essential (primary) hypertension: Secondary | ICD-10-CM

## 2022-01-06 DIAGNOSIS — D3131 Benign neoplasm of right choroid: Secondary | ICD-10-CM

## 2022-01-06 DIAGNOSIS — R69 Illness, unspecified: Secondary | ICD-10-CM | POA: Diagnosis not present

## 2022-01-06 DIAGNOSIS — H35033 Hypertensive retinopathy, bilateral: Secondary | ICD-10-CM

## 2022-01-06 DIAGNOSIS — H43813 Vitreous degeneration, bilateral: Secondary | ICD-10-CM

## 2022-01-08 DIAGNOSIS — Z4789 Encounter for other orthopedic aftercare: Secondary | ICD-10-CM | POA: Diagnosis not present

## 2022-01-08 DIAGNOSIS — S82122D Displaced fracture of lateral condyle of left tibia, subsequent encounter for closed fracture with routine healing: Secondary | ICD-10-CM | POA: Diagnosis not present

## 2022-01-08 DIAGNOSIS — S82115D Nondisplaced fracture of left tibial spine, subsequent encounter for closed fracture with routine healing: Secondary | ICD-10-CM | POA: Diagnosis not present

## 2022-01-08 DIAGNOSIS — Z9181 History of falling: Secondary | ICD-10-CM | POA: Diagnosis not present

## 2022-01-08 DIAGNOSIS — R279 Unspecified lack of coordination: Secondary | ICD-10-CM | POA: Diagnosis not present

## 2022-01-08 DIAGNOSIS — R2681 Unsteadiness on feet: Secondary | ICD-10-CM | POA: Diagnosis not present

## 2022-01-08 DIAGNOSIS — M6281 Muscle weakness (generalized): Secondary | ICD-10-CM | POA: Diagnosis not present

## 2022-01-08 DIAGNOSIS — M25562 Pain in left knee: Secondary | ICD-10-CM | POA: Diagnosis not present

## 2022-01-11 DIAGNOSIS — M25562 Pain in left knee: Secondary | ICD-10-CM | POA: Diagnosis not present

## 2022-01-11 DIAGNOSIS — S82115D Nondisplaced fracture of left tibial spine, subsequent encounter for closed fracture with routine healing: Secondary | ICD-10-CM | POA: Diagnosis not present

## 2022-01-11 DIAGNOSIS — R2681 Unsteadiness on feet: Secondary | ICD-10-CM | POA: Diagnosis not present

## 2022-01-11 DIAGNOSIS — Z4789 Encounter for other orthopedic aftercare: Secondary | ICD-10-CM | POA: Diagnosis not present

## 2022-01-11 DIAGNOSIS — M6281 Muscle weakness (generalized): Secondary | ICD-10-CM | POA: Diagnosis not present

## 2022-01-11 DIAGNOSIS — R279 Unspecified lack of coordination: Secondary | ICD-10-CM | POA: Diagnosis not present

## 2022-01-11 DIAGNOSIS — S82122D Displaced fracture of lateral condyle of left tibia, subsequent encounter for closed fracture with routine healing: Secondary | ICD-10-CM | POA: Diagnosis not present

## 2022-01-11 DIAGNOSIS — Z9181 History of falling: Secondary | ICD-10-CM | POA: Diagnosis not present

## 2022-01-13 DIAGNOSIS — Z4789 Encounter for other orthopedic aftercare: Secondary | ICD-10-CM | POA: Diagnosis not present

## 2022-01-13 DIAGNOSIS — S82115D Nondisplaced fracture of left tibial spine, subsequent encounter for closed fracture with routine healing: Secondary | ICD-10-CM | POA: Diagnosis not present

## 2022-01-13 DIAGNOSIS — M6281 Muscle weakness (generalized): Secondary | ICD-10-CM | POA: Diagnosis not present

## 2022-01-13 DIAGNOSIS — Z9181 History of falling: Secondary | ICD-10-CM | POA: Diagnosis not present

## 2022-01-13 DIAGNOSIS — S82122D Displaced fracture of lateral condyle of left tibia, subsequent encounter for closed fracture with routine healing: Secondary | ICD-10-CM | POA: Diagnosis not present

## 2022-01-13 DIAGNOSIS — R279 Unspecified lack of coordination: Secondary | ICD-10-CM | POA: Diagnosis not present

## 2022-01-13 DIAGNOSIS — R2681 Unsteadiness on feet: Secondary | ICD-10-CM | POA: Diagnosis not present

## 2022-01-13 DIAGNOSIS — M25562 Pain in left knee: Secondary | ICD-10-CM | POA: Diagnosis not present

## 2022-01-14 DIAGNOSIS — M79605 Pain in left leg: Secondary | ICD-10-CM | POA: Diagnosis not present

## 2022-01-14 DIAGNOSIS — R279 Unspecified lack of coordination: Secondary | ICD-10-CM | POA: Diagnosis not present

## 2022-01-14 DIAGNOSIS — I10 Essential (primary) hypertension: Secondary | ICD-10-CM | POA: Diagnosis not present

## 2022-01-14 DIAGNOSIS — R2681 Unsteadiness on feet: Secondary | ICD-10-CM | POA: Diagnosis not present

## 2022-01-14 DIAGNOSIS — R278 Other lack of coordination: Secondary | ICD-10-CM | POA: Diagnosis not present

## 2022-01-14 DIAGNOSIS — S82115D Nondisplaced fracture of left tibial spine, subsequent encounter for closed fracture with routine healing: Secondary | ICD-10-CM | POA: Diagnosis not present

## 2022-01-14 DIAGNOSIS — S82122D Displaced fracture of lateral condyle of left tibia, subsequent encounter for closed fracture with routine healing: Secondary | ICD-10-CM | POA: Diagnosis not present

## 2022-01-14 DIAGNOSIS — M25562 Pain in left knee: Secondary | ICD-10-CM | POA: Diagnosis not present

## 2022-01-14 DIAGNOSIS — M6281 Muscle weakness (generalized): Secondary | ICD-10-CM | POA: Diagnosis not present

## 2022-01-14 DIAGNOSIS — H25812 Combined forms of age-related cataract, left eye: Secondary | ICD-10-CM | POA: Diagnosis not present

## 2022-01-14 DIAGNOSIS — H2512 Age-related nuclear cataract, left eye: Secondary | ICD-10-CM | POA: Diagnosis not present

## 2022-01-21 ENCOUNTER — Encounter: Payer: Self-pay | Admitting: Orthopaedic Surgery

## 2022-01-21 ENCOUNTER — Ambulatory Visit (INDEPENDENT_AMBULATORY_CARE_PROVIDER_SITE_OTHER): Payer: Medicare PPO

## 2022-01-21 ENCOUNTER — Ambulatory Visit: Payer: Medicare PPO | Admitting: Orthopaedic Surgery

## 2022-01-21 DIAGNOSIS — S82122D Displaced fracture of lateral condyle of left tibia, subsequent encounter for closed fracture with routine healing: Secondary | ICD-10-CM

## 2022-01-21 NOTE — Progress Notes (Signed)
The patient is an 86 year old female who is about 6 weeks out from a twisting injury to her left knee in which she sustained a lateral tibial plateau fracture was nondisplaced.  She is now up to 50% weightbearing on the left lower extremity.  She is in skilled nursing but they are hoping to release her soon as her weightbearing gets advanced along.  She reports some pain but overall is doing well.  We have had her out of her knee brace as well.  Her left knee today shows no effusion.  She has full range of motion of that left knee with no difficulty at all.  There is a little bit of pain over the lateral joint line.  2 views left knee show that the joint space is well-maintained.  The fracture is not collapsed.  There are some sclerotic changes in the metaphyseal area just below the joint surface showing that the fracture is healing nicely.  She can start advancing to full weightbearing as tolerated and as comfort allows on the left lower extremity.  I will see her back for final visit in 4 weeks with a final AP and lateral of her left knee.  All questions and concerns were answered and addressed.

## 2022-01-26 DIAGNOSIS — H25812 Combined forms of age-related cataract, left eye: Secondary | ICD-10-CM | POA: Diagnosis not present

## 2022-01-26 DIAGNOSIS — H2512 Age-related nuclear cataract, left eye: Secondary | ICD-10-CM | POA: Diagnosis not present

## 2022-02-03 ENCOUNTER — Encounter (INDEPENDENT_AMBULATORY_CARE_PROVIDER_SITE_OTHER): Payer: Medicare PPO | Admitting: Ophthalmology

## 2022-02-04 ENCOUNTER — Encounter (INDEPENDENT_AMBULATORY_CARE_PROVIDER_SITE_OTHER): Payer: Medicare PPO | Admitting: Ophthalmology

## 2022-02-04 DIAGNOSIS — H43813 Vitreous degeneration, bilateral: Secondary | ICD-10-CM

## 2022-02-04 DIAGNOSIS — H353231 Exudative age-related macular degeneration, bilateral, with active choroidal neovascularization: Secondary | ICD-10-CM

## 2022-02-04 DIAGNOSIS — I10 Essential (primary) hypertension: Secondary | ICD-10-CM

## 2022-02-04 DIAGNOSIS — H35033 Hypertensive retinopathy, bilateral: Secondary | ICD-10-CM

## 2022-02-04 DIAGNOSIS — D3131 Benign neoplasm of right choroid: Secondary | ICD-10-CM | POA: Diagnosis not present

## 2022-02-09 DIAGNOSIS — S82142A Displaced bicondylar fracture of left tibia, initial encounter for closed fracture: Secondary | ICD-10-CM | POA: Diagnosis not present

## 2022-02-16 ENCOUNTER — Ambulatory Visit (INDEPENDENT_AMBULATORY_CARE_PROVIDER_SITE_OTHER): Payer: Medicare PPO

## 2022-02-16 DIAGNOSIS — I442 Atrioventricular block, complete: Secondary | ICD-10-CM

## 2022-02-16 LAB — CUP PACEART REMOTE DEVICE CHECK
Battery Impedance: 861 Ohm
Battery Remaining Longevity: 67 mo
Battery Voltage: 2.77 V
Brady Statistic AP VP Percent: 15 %
Brady Statistic AP VS Percent: 0 %
Brady Statistic AS VP Percent: 84 %
Brady Statistic AS VS Percent: 1 %
Date Time Interrogation Session: 20230613094934
Implantable Lead Implant Date: 20160512
Implantable Lead Implant Date: 20160512
Implantable Lead Location: 753859
Implantable Lead Location: 753860
Implantable Lead Model: 5076
Implantable Lead Model: 5092
Implantable Pulse Generator Implant Date: 20160512
Lead Channel Impedance Value: 476 Ohm
Lead Channel Impedance Value: 793 Ohm
Lead Channel Pacing Threshold Amplitude: 0.75 V
Lead Channel Pacing Threshold Amplitude: 0.75 V
Lead Channel Pacing Threshold Pulse Width: 0.4 ms
Lead Channel Pacing Threshold Pulse Width: 0.4 ms
Lead Channel Setting Pacing Amplitude: 2 V
Lead Channel Setting Pacing Amplitude: 2.5 V
Lead Channel Setting Pacing Pulse Width: 0.4 ms
Lead Channel Setting Sensing Sensitivity: 4 mV

## 2022-02-18 ENCOUNTER — Encounter: Payer: Self-pay | Admitting: Orthopaedic Surgery

## 2022-02-18 ENCOUNTER — Ambulatory Visit (INDEPENDENT_AMBULATORY_CARE_PROVIDER_SITE_OTHER): Payer: Medicare PPO

## 2022-02-18 ENCOUNTER — Ambulatory Visit: Payer: Medicare PPO | Admitting: Orthopaedic Surgery

## 2022-02-18 DIAGNOSIS — S82122D Displaced fracture of lateral condyle of left tibia, subsequent encounter for closed fracture with routine healing: Secondary | ICD-10-CM

## 2022-02-18 NOTE — Progress Notes (Signed)
The patient is a pleasant 86 year old female who is about 9 weeks into a twisting injury in which she sustained a nondisplaced lateral tibial plateau fracture of the left knee.  She was first nonweightbearing and then we eventually progressed her weightbearing.  She does ambulate with a walker now.  She only has problems getting up and down his painful but she has made significant progress.  There is actually no pain over the lateral tibial plateau today.  Her range of motion of that left knee is excellent.  She does have some medial joint line tenderness.  2 views of the left knee show the fracture is healed completely.  The joint space is congruent.  The bone is osteopenic.  She will continue to increase his activities as comfort allows.  Home health is still working with her so I will have them work on strengthening and stair training.  From my standpoint follow-up can be as needed.  All questions and concerns were answered and addressed.

## 2022-02-22 ENCOUNTER — Telehealth: Payer: Self-pay | Admitting: Orthopaedic Surgery

## 2022-02-22 NOTE — Telephone Encounter (Signed)
OR called for pt plan of care for a week.    CB [phone]

## 2022-03-02 NOTE — Progress Notes (Signed)
Remote pacemaker transmission.   

## 2022-03-03 ENCOUNTER — Ambulatory Visit: Payer: Medicare PPO | Admitting: Internal Medicine

## 2022-03-03 ENCOUNTER — Encounter: Payer: Self-pay | Admitting: Internal Medicine

## 2022-03-03 VITALS — BP 112/72 | HR 76 | Ht 64.0 in | Wt 176.6 lb

## 2022-03-03 DIAGNOSIS — I442 Atrioventricular block, complete: Secondary | ICD-10-CM | POA: Diagnosis not present

## 2022-03-03 NOTE — Progress Notes (Signed)
Patient Care Team: [name], MD as PCP - General (Internal Medicine) [name], MD as PCP - Cardiology (Cardiology)   HPI  [name] [name] is a 86 y.o. female Seen to establish pacemaker followup implanted for CHB and previously followed by [facility]  Has recenctly fallen and broken her leg, this has been better.  She was at rehab.  Now is home.  Physical therapy.  Some edema.  No chest pain.  Breathlessness is about the same, is able to walk some stairs, to do her daily living on the flat.  DATE TEST EF   5/16 Echo   60-65 %   11/21 Echo   50-55 %              Date Cr K Hgb  2/22 0.88 4.8 14.5          Records and Results Reviewed   Past Medical History:  Diagnosis Date   Anxiety    Broken arm Right   Broken leg    Complete heart block (HCC)    Hypertension    Postural dizziness     Past Surgical History:  Procedure Laterality Date   EP IMPLANTABLE DEVICE N/A 01/16/2015   MDT Adapta L PPM implanted by Dr Rayann Heman for complete heart block   IR GENERIC HISTORICAL  09/15/2016   IR RADIOLOGIST EVAL & MGMT 09/15/2016 [facility]   IR GENERIC HISTORICAL  09/28/2016   IR KYPHO THORACIC WITH BONE BIOPSY 09/28/2016 Luanne Bras, MD [facility]   IR GENERIC HISTORICAL  10/14/2016   IR RADIOLOGIST EVAL & MGMT 10/14/2016 [facility]   PACEMAKER INSERTION N/A 01/2015    Current Meds  Medication Sig   acetaminophen (TYLENOL) 500 MG tablet Take 500 mg by mouth every 6 (six) hours as needed for mild pain.   ALPRAZolam (XANAX) 0.25 MG tablet Take 0.125 tablets by mouth daily as needed for anxiety.    aspirin 325 MG tablet Take 650 mg by mouth at bedtime as needed for moderate pain. Will take at bedtime if needed for pain   aspirin 81 MG tablet Take 81 mg by mouth daily.   Besifloxacin HCl 0.6 % SUSP Apply 1 drop to eye 4 (four) times daily. For 2 days after injection   calcitonin, salmon, (MIACALCIN/FORTICAL) 200 UNIT/ACT nasal spray 1 spray in 1 nostril,  alternating nostrils daily   calcium carbonate (TUMS EX) 750 MG chewable tablet Chew 1 tablet by mouth 2 (two) times daily as needed.    Cholecalciferol (VITAMIN D3) 2000 units TABS Take 1 tablet by mouth daily.   famotidine (PEPCID) 20 MG tablet Take 10-20 mg by mouth daily.    fluticasone (FLONASE) 50 MCG/ACT nasal spray Place 2 sprays into both nostrils daily as needed for allergies.    hydrALAZINE (APRESOLINE) 10 MG tablet Take 10 mg three times a day only if Systolic blood pressure greater than 180 (Patient taking differently: Take 10 mg three times a day only if Systolic blood pressure is 180 or greater)   lidocaine (LIDODERM) 5 % Place 1 patch onto the skin daily as needed. Remove & Discard patch within 12 hours or as directed by MD   Magnesium Hydroxide (PHILLIPS MILK OF MAGNESIA PO) Take 1-2 tablets by mouth 2 (two) times daily as needed.   Multiple Vitamins-Minerals (PRESERVISION AREDS) CAPS 1 capsule   PRESCRIPTION MEDICATION Place into both eyes every 30 (thirty) days. Avastin injection in right eye, Ilea in left eye every 4 weeks by  Dr Zigmund Daniel    Allergies  Allergen Reactions   Hctz [Hydrochlorothiazide] Shortness Of Breath and Other (See Comments)    Very short winded, made her hurt all over    Losartan Other (See Comments)    Skin crawling feelings   Macrolides And Ketolides Other (See Comments)    Pyloric sphincter flareups-unsure of which -mycins   Amlodipine Other (See Comments)   Benazepril Other (See Comments)    Doesn't work for patient   Penicillins Other (See Comments)    abd pain Has patient had a PCN reaction causing immediate rash, facial/tongue/throat swelling, SOB or lightheadedness with hypotension: No Has patient had a PCN reaction causing severe rash involving mucus membranes or skin necrosis: No Has patient had a PCN reaction that required hospitalization No Has patient had a PCN reaction occurring within the last 10 years: No If all of the above answers  are "NO", then may proceed with Cephalosporin use.        Review of Systems negative except from HPI and PMH  Physical Exam BP 112/72   Pulse 76   Ht '5\' 4"'$  (1.626 m)   Wt 176 lb 9.6 oz (80.1 kg)   SpO2 94%   BMI 30.31 kg/m  Well developed and well nourished in no acute distress HENT normal E scleral and icterus clear Neck Supple JVP flat; carotids brisk and full Clear to ausculation Regular rate and rhythm, no murmurs gallops or rub Soft with active bowel sounds No clubbing cyanosis Trace Edema Alert and oriented, grossly normal motor and sensory function Skin Warm and Dry  ECG is ectopic    CrCl cannot be calculated (Patient's most recent lab result is older than the maximum 21 days allowed.).   Assessment and  Plan Complete heart block  Pacemaker  medtronic   Hypertension   Orthostatic lightheadedness     Use BP prn for systolic BP 485  otherwise no meds for HTN to prevent excessive orthostatic  Discussed the importance of exercise and balance therapy      Current medicines are reviewed at length with the patient today .  The patient does not  have concerns regarding medicines.

## 2022-03-03 NOTE — Patient Instructions (Signed)

## 2022-03-04 ENCOUNTER — Encounter (INDEPENDENT_AMBULATORY_CARE_PROVIDER_SITE_OTHER): Payer: Medicare PPO | Admitting: Ophthalmology

## 2022-03-04 DIAGNOSIS — H353231 Exudative age-related macular degeneration, bilateral, with active choroidal neovascularization: Secondary | ICD-10-CM | POA: Diagnosis not present

## 2022-03-04 DIAGNOSIS — I10 Essential (primary) hypertension: Secondary | ICD-10-CM | POA: Diagnosis not present

## 2022-03-04 DIAGNOSIS — H43813 Vitreous degeneration, bilateral: Secondary | ICD-10-CM | POA: Diagnosis not present

## 2022-03-04 DIAGNOSIS — H35033 Hypertensive retinopathy, bilateral: Secondary | ICD-10-CM | POA: Diagnosis not present

## 2022-03-04 DIAGNOSIS — D3131 Benign neoplasm of right choroid: Secondary | ICD-10-CM | POA: Diagnosis not present

## 2022-04-01 ENCOUNTER — Encounter (INDEPENDENT_AMBULATORY_CARE_PROVIDER_SITE_OTHER): Payer: Medicare PPO | Admitting: Ophthalmology

## 2022-04-01 DIAGNOSIS — I10 Essential (primary) hypertension: Secondary | ICD-10-CM

## 2022-04-01 DIAGNOSIS — D3131 Benign neoplasm of right choroid: Secondary | ICD-10-CM | POA: Diagnosis not present

## 2022-04-01 DIAGNOSIS — H35033 Hypertensive retinopathy, bilateral: Secondary | ICD-10-CM | POA: Diagnosis not present

## 2022-04-01 DIAGNOSIS — H43813 Vitreous degeneration, bilateral: Secondary | ICD-10-CM | POA: Diagnosis not present

## 2022-04-01 DIAGNOSIS — H353231 Exudative age-related macular degeneration, bilateral, with active choroidal neovascularization: Secondary | ICD-10-CM

## 2022-04-28 DIAGNOSIS — R42 Dizziness and giddiness: Secondary | ICD-10-CM | POA: Insufficient documentation

## 2022-04-28 NOTE — Progress Notes (Unsigned)
Cardiology Office Note   Date:  04/29/2022   ID:  [name] [name], DOB 1935/10/09, MRN [id number]  PCP:  [name], MD  Cardiologist:   [name], MD   Chief Complaint  Patient presents with   Shortness of Breath    History of Present Illness: [name] [name] is a 86 y.o. female who presents for evaluation of difficult to control hypertension.  She had heart block and did have a pacemaker placed.    Since I saw her [place] was [place] the [place] and she [place] her leg.  She had to go to rehab.  The patient denies any new symptoms such as chest discomfort, neck or arm discomfort. There has been no new PND or orthopnea. There have been no reported palpitations, presyncope or syncope.  However, she has been having shortness of breath with activities.  She wonders if this could be related to the fact that she was not able to do much and she just has weakness.  She did see Dr. Caryl Comes who mentioned he wondered if there could be some heart failure given her chronic pacing.  She has had a little swelling in the leg that she broke but she is not having any other weight gain or edema.  She is about to celebrate her 6th anniversary with her husband.  She gets around at home slowly with a cane.   Past Medical History:  Diagnosis Date   Anxiety    Broken arm Right   Broken leg    Complete heart block (HCC)    Hypertension    Postural dizziness     Past Surgical History:  Procedure Laterality Date   EP IMPLANTABLE DEVICE N/A 01/16/2015   MDT Adapta L PPM implanted by Dr Rayann Heman for complete heart block   IR GENERIC HISTORICAL  09/15/2016   IR RADIOLOGIST EVAL & MGMT 09/15/2016 [facility]   IR GENERIC HISTORICAL  09/28/2016   IR KYPHO THORACIC WITH BONE BIOPSY 09/28/2016 Luanne Bras, MD [facility]   IR GENERIC HISTORICAL  10/14/2016   IR RADIOLOGIST EVAL & MGMT 10/14/2016 [facility]   PACEMAKER INSERTION N/A 01/2015      Current Outpatient Medications  Medication Sig Dispense Refill   acetaminophen (TYLENOL) 500 MG tablet Take 500 mg by mouth every 6 (six) hours as needed for mild pain.     ALPRAZolam (XANAX) 0.25 MG tablet Take 0.125 tablets by mouth daily as needed for anxiety.      aspirin 325 MG tablet Take 650 mg by mouth at bedtime as needed for moderate pain. Will take at bedtime if needed for pain     aspirin 81 MG tablet Take 81 mg by mouth daily.     Besifloxacin HCl 0.6 % SUSP Apply 1 drop to eye 4 (four) times daily. For 2 days after injection     calcium carbonate (TUMS EX) 750 MG chewable tablet Chew 1 tablet by mouth 2 (two) times daily as needed.      Cholecalciferol (VITAMIN D3) 2000 units TABS Take 1 tablet by mouth daily.     famotidine (PEPCID) 20 MG tablet Take 10-20 mg by mouth daily.      fluticasone (FLONASE) 50 MCG/ACT nasal spray Place 2 sprays into both nostrils daily as needed for allergies.      hydrALAZINE (APRESOLINE) 10 MG tablet Take 10 mg three times a day only if Systolic blood pressure greater than 180 (Patient taking differently: Take 10 mg three times a day only  if Systolic blood pressure is 180 or greater) 90 tablet 6   lidocaine (LIDODERM) 5 % Place 1 patch onto the skin daily as needed. Remove & Discard patch within 12 hours or as directed by MD     Magnesium Hydroxide (PHILLIPS MILK OF MAGNESIA PO) Take 1-2 tablets by mouth 2 (two) times daily as needed.     Multiple Vitamins-Minerals (PRESERVISION AREDS) CAPS 1 capsule     PRESCRIPTION MEDICATION Place into both eyes every 30 (thirty) days. Avastin injection in right eye, Ilea in left eye every 4 weeks by Dr Zigmund Daniel     No current facility-administered medications for this visit.    Allergies:   Hctz [hydrochlorothiazide], Losartan, Macrolides and ketolides, Amlodipine, Benazepril, and Penicillins    ROS:  Please see the history of present illness.   Otherwise, review of systems are positive for none.   All  other systems are reviewed and negative.    PHYSICAL EXAM: VS:  BP (!) 128/90 (BP Location: Left Arm, Patient Position: Sitting, Cuff Size: Normal)   Pulse 74   Ht '5\' 4"'$  (1.626 m)   Wt 177 lb (80.3 kg)   BMI 30.38 kg/m  , BMI Body mass index is 30.38 kg/m.  GENERAL:  Well appearing NECK:  No jugular venous distention, waveform within normal limits, carotid upstroke brisk and symmetric, no bruits, no thyromegaly LUNGS:  Clear to auscultation bilaterally CHEST: Well-healed pacemaker pocket HEART:  PMI not displaced or sustained,S1 and S2 within normal limits, no S3, no S4, no clicks, no rubs, soft apical brief systolic nonradiating murmurs ABD:  Flat, positive bowel sounds normal in frequency in pitch, no bruits, no rebound, no guarding, no midline pulsatile mass, no hepatomegaly, no splenomegaly EXT:  2 plus pulses throughout, no edema, no cyanosis no clubbing   EKG:  EKG is  ordered today. Sinus rhythm with ventricular pacing 100% capture   Recent Labs: No results found for requested labs within last 365 days.      Wt Readings from Last 3 Encounters:  04/29/22 177 lb (80.3 kg)  03/03/22 176 lb 9.6 oz (80.1 kg)  09/18/21 178 lb 9.6 oz (81 kg)      Other studies Reviewed: Additional studies/ records that were reviewed today include:  Review of the above records demonstrates:   See elsewhere   ASSESSMENT AND PLAN:   Complete heart block/Status post pacemaker She is up-to-date with follow-up.   She saw Dr. Caryl Comes in June.  HTN Her blood pressure is labile.  At target.  No change in therapy.   AS This was mild in Nov 2021.  No change in therapy.  No further imaging at this point.  SOB I do not strongly suspect heart failure but I am going to check a BNP level.  If this is normal I would not pursue imaging if it is abnormal I will probably order an echocardiogram.  If her breathing does not get better over 3 to 4 months as she is recovered from her fracture I will also  follow-up with an echo.   Current medicines are reviewed at length with the patient today.  The patient does not have concerns regarding medicines.  The following changes have been made:   None  Labs/ tests ordered today include:     Orders Placed This Encounter  Procedures   Brain natriuretic peptide   EKG 12-Lead     Disposition:   FU with me  in 4 months.   Signed, [name],  MD  04/29/2022 11:38 AM     [facility]

## 2022-04-29 ENCOUNTER — Ambulatory Visit: Payer: Medicare PPO | Admitting: Cardiology

## 2022-04-29 ENCOUNTER — Encounter: Payer: Self-pay | Admitting: Cardiology

## 2022-04-29 VITALS — BP 128/90 | HR 74 | Ht 64.0 in | Wt 177.0 lb

## 2022-04-29 DIAGNOSIS — R0602 Shortness of breath: Secondary | ICD-10-CM

## 2022-04-29 DIAGNOSIS — R42 Dizziness and giddiness: Secondary | ICD-10-CM

## 2022-04-29 DIAGNOSIS — I350 Nonrheumatic aortic (valve) stenosis: Secondary | ICD-10-CM

## 2022-04-29 DIAGNOSIS — I10 Essential (primary) hypertension: Secondary | ICD-10-CM

## 2022-04-29 NOTE — Patient Instructions (Signed)
Medication Instructions:  Your physician recommends that you continue on your current medications as directed. Please refer to the Current Medication list given to you today.  *If you need a refill on your cardiac medications before your next appointment, please call your pharmacy*  Lab Work: Your physician recommends that you return for lab work TODAY:  BNP If you have labs (blood work) drawn today and your tests are completely normal, you will receive your results only by: [place] (if you have [place]) OR A paper copy in the mail If you have any lab test that is abnormal or we need to change your treatment, we will call you to review the results.  Testing/Procedures: NONE ordered at this time of appointment   Follow-Up: At [facility], you and your health needs are our priority.  As part of our continuing mission to provide you with exceptional heart care, we have created designated Provider Care Teams.  These Care Teams include your primary Cardiologist (physician) and Advanced Practice Providers (APPs -  Physician Assistants and Nurse Practitioners) who all work together to provide you with the care you need, when you need it.  We recommend signing up for the patient portal called "MyChart".  Sign up information is provided on this After Visit Summary.  MyChart is used to connect with patients for Virtual Visits (Telemedicine).  Patients are able to view lab/test results, encounter notes, upcoming appointments, etc.  Non-urgent messages can be sent to your provider as well.   To learn more about what you can do with MyChart, go to [email or web address].    Your next appointment:   4 month(s)  The format for your next appointment:   In Person  Provider:   [name], MD    Other Instructions   Important Information About Sugar

## 2022-04-30 LAB — BRAIN NATRIURETIC PEPTIDE: BNP: 57.9 pg/mL (ref 0.0–100.0)

## 2022-05-03 ENCOUNTER — Encounter (INDEPENDENT_AMBULATORY_CARE_PROVIDER_SITE_OTHER): Payer: Medicare PPO | Admitting: Ophthalmology

## 2022-05-03 DIAGNOSIS — I10 Essential (primary) hypertension: Secondary | ICD-10-CM

## 2022-05-03 DIAGNOSIS — D3131 Benign neoplasm of right choroid: Secondary | ICD-10-CM

## 2022-05-03 DIAGNOSIS — H43813 Vitreous degeneration, bilateral: Secondary | ICD-10-CM

## 2022-05-03 DIAGNOSIS — H353231 Exudative age-related macular degeneration, bilateral, with active choroidal neovascularization: Secondary | ICD-10-CM

## 2022-05-03 DIAGNOSIS — H35033 Hypertensive retinopathy, bilateral: Secondary | ICD-10-CM | POA: Diagnosis not present

## 2022-05-18 ENCOUNTER — Ambulatory Visit (INDEPENDENT_AMBULATORY_CARE_PROVIDER_SITE_OTHER): Payer: Medicare PPO

## 2022-05-18 DIAGNOSIS — I442 Atrioventricular block, complete: Secondary | ICD-10-CM | POA: Diagnosis not present

## 2022-05-21 LAB — CUP PACEART REMOTE DEVICE CHECK
Battery Impedance: 937 Ohm
Battery Remaining Longevity: 65 mo
Battery Voltage: 2.77 V
Brady Statistic AP VP Percent: 14 %
Brady Statistic AP VS Percent: 0 %
Brady Statistic AS VP Percent: 86 %
Brady Statistic AS VS Percent: 0 %
Date Time Interrogation Session: 20230912214512
Implantable Lead Implant Date: 20160512
Implantable Lead Implant Date: 20160512
Implantable Lead Location: 753859
Implantable Lead Location: 753860
Implantable Lead Model: 5076
Implantable Lead Model: 5092
Implantable Pulse Generator Implant Date: 20160512
Lead Channel Impedance Value: 476 Ohm
Lead Channel Impedance Value: 861 Ohm
Lead Channel Pacing Threshold Amplitude: 0.75 V
Lead Channel Pacing Threshold Amplitude: 0.875 V
Lead Channel Pacing Threshold Pulse Width: 0.4 ms
Lead Channel Pacing Threshold Pulse Width: 0.4 ms
Lead Channel Setting Pacing Amplitude: 2 V
Lead Channel Setting Pacing Amplitude: 2.5 V
Lead Channel Setting Pacing Pulse Width: 0.4 ms
Lead Channel Setting Sensing Sensitivity: 4 mV

## 2022-06-03 ENCOUNTER — Encounter (INDEPENDENT_AMBULATORY_CARE_PROVIDER_SITE_OTHER): Payer: Medicare PPO | Admitting: Ophthalmology

## 2022-06-03 DIAGNOSIS — H35033 Hypertensive retinopathy, bilateral: Secondary | ICD-10-CM

## 2022-06-03 DIAGNOSIS — I10 Essential (primary) hypertension: Secondary | ICD-10-CM | POA: Diagnosis not present

## 2022-06-03 DIAGNOSIS — H353231 Exudative age-related macular degeneration, bilateral, with active choroidal neovascularization: Secondary | ICD-10-CM

## 2022-06-03 DIAGNOSIS — D3131 Benign neoplasm of right choroid: Secondary | ICD-10-CM

## 2022-06-03 DIAGNOSIS — H43813 Vitreous degeneration, bilateral: Secondary | ICD-10-CM | POA: Diagnosis not present

## 2022-07-05 ENCOUNTER — Encounter (INDEPENDENT_AMBULATORY_CARE_PROVIDER_SITE_OTHER): Payer: Medicare PPO | Admitting: Ophthalmology

## 2022-07-05 DIAGNOSIS — H35033 Hypertensive retinopathy, bilateral: Secondary | ICD-10-CM

## 2022-07-05 DIAGNOSIS — H353231 Exudative age-related macular degeneration, bilateral, with active choroidal neovascularization: Secondary | ICD-10-CM

## 2022-07-05 DIAGNOSIS — D3131 Benign neoplasm of right choroid: Secondary | ICD-10-CM

## 2022-07-05 DIAGNOSIS — I10 Essential (primary) hypertension: Secondary | ICD-10-CM

## 2022-07-05 DIAGNOSIS — H43813 Vitreous degeneration, bilateral: Secondary | ICD-10-CM

## 2022-08-09 ENCOUNTER — Encounter (INDEPENDENT_AMBULATORY_CARE_PROVIDER_SITE_OTHER): Payer: Medicare PPO | Admitting: Ophthalmology

## 2022-08-09 DIAGNOSIS — D3131 Benign neoplasm of right choroid: Secondary | ICD-10-CM

## 2022-08-09 DIAGNOSIS — H35033 Hypertensive retinopathy, bilateral: Secondary | ICD-10-CM

## 2022-08-09 DIAGNOSIS — H353231 Exudative age-related macular degeneration, bilateral, with active choroidal neovascularization: Secondary | ICD-10-CM

## 2022-08-09 DIAGNOSIS — H43813 Vitreous degeneration, bilateral: Secondary | ICD-10-CM | POA: Diagnosis not present

## 2022-08-09 DIAGNOSIS — I10 Essential (primary) hypertension: Secondary | ICD-10-CM | POA: Diagnosis not present

## 2022-08-17 ENCOUNTER — Ambulatory Visit (INDEPENDENT_AMBULATORY_CARE_PROVIDER_SITE_OTHER): Payer: Medicare PPO

## 2022-08-17 DIAGNOSIS — I442 Atrioventricular block, complete: Secondary | ICD-10-CM | POA: Diagnosis not present

## 2022-08-18 LAB — CUP PACEART REMOTE DEVICE CHECK
Battery Impedance: 990 Ohm
Battery Remaining Longevity: 63 mo
Battery Voltage: 2.77 V
Brady Statistic AP VP Percent: 15 %
Brady Statistic AP VS Percent: 0 %
Brady Statistic AS VP Percent: 85 %
Brady Statistic AS VS Percent: 0 %
Date Time Interrogation Session: 20231213101039
Implantable Lead Connection Status: 753985
Implantable Lead Connection Status: 753985
Implantable Lead Implant Date: 20160512
Implantable Lead Implant Date: 20160512
Implantable Lead Location: 753859
Implantable Lead Location: 753860
Implantable Lead Model: 5076
Implantable Lead Model: 5092
Implantable Pulse Generator Implant Date: 20160512
Lead Channel Impedance Value: 476 Ohm
Lead Channel Impedance Value: 859 Ohm
Lead Channel Pacing Threshold Amplitude: 0.875 V
Lead Channel Pacing Threshold Amplitude: 1 V
Lead Channel Pacing Threshold Pulse Width: 0.4 ms
Lead Channel Pacing Threshold Pulse Width: 0.4 ms
Lead Channel Setting Pacing Amplitude: 2 V
Lead Channel Setting Pacing Amplitude: 2.5 V
Lead Channel Setting Pacing Pulse Width: 0.4 ms
Lead Channel Setting Sensing Sensitivity: 4 mV
Zone Setting Status: 755011
Zone Setting Status: 755011

## 2022-08-22 NOTE — Progress Notes (Unsigned)
Cardiology Office Note   Date:  08/23/2022   ID:  [name] [name], DOB 1936/05/16, MRN [id number]  PCP:  [name] Huddle, MD  Cardiologist:   [name] Breeding, MD   Chief Complaint  Patient presents with   Shortness of Breath    History of Present Illness: [name] [name] is a 86 y.o. female who presents for evaluation of difficult to control.  [name] [name] had heart block and did have a pacemaker placed.    Since I last saw her [name] [name] has been doing okay.  [name] [name] walks with a cane.  [name] [name] still goes up and down the stairs couple times a day carefully.  [name] [name] is limited more by back and leg pain.  [name] [name] says her breathing which short with activities seems to be better since [name] [name] got her pacemaker.  He is not describing any PND or orthopnea.  [name] [name] had no palpitations, presyncope or syncope.  [name] [name] has had no chest discomfort.   Past Medical History:  Diagnosis Date   Anxiety    Broken arm Right   Broken leg    Complete heart block (HCC)    Hypertension    Postural dizziness     Past Surgical History:  Procedure Laterality Date   EP IMPLANTABLE DEVICE N/A 01/16/2015   MDT Adapta L PPM implanted by Dr Rayann Heman for complete heart block   IR GENERIC HISTORICAL  09/15/2016   IR RADIOLOGIST EVAL & MGMT 09/15/2016 [facility]   IR GENERIC HISTORICAL  09/28/2016   IR KYPHO THORACIC WITH BONE BIOPSY 09/28/2016 [name] Bras, MD [facility]   IR GENERIC HISTORICAL  10/14/2016   IR RADIOLOGIST EVAL & MGMT 10/14/2016 [facility]   PACEMAKER INSERTION N/A 01/2015     Current Outpatient Medications  Medication Sig Dispense Refill   acetaminophen (TYLENOL) 500 MG tablet Take 500 mg by mouth every 6 (six) hours as needed for mild pain.     ALPRAZolam (XANAX) 0.25 MG tablet Take 0.125 tablets by mouth daily as needed for anxiety.      aspirin 325 MG tablet Take 650 mg by mouth at bedtime as needed for moderate pain. Will take at bedtime if needed for  pain     aspirin 81 MG tablet Take 81 mg by mouth daily.     Besifloxacin HCl 0.6 % SUSP Apply 1 drop to eye 4 (four) times daily. For 2 days after injection     calcium carbonate (TUMS EX) 750 MG chewable tablet Chew 1 tablet by mouth 2 (two) times daily as needed.      Cholecalciferol (VITAMIN D3) 2000 units TABS Take 1 tablet by mouth daily.     famotidine (PEPCID) 20 MG tablet Take 10-20 mg by mouth daily.      faricimab-svoa (VABYSMO) 6 MG/0.05ML SOLN intravitreal injection 6 mg by Intravitreal route every 5 (five) weeks.     fluticasone (FLONASE) 50 MCG/ACT nasal spray Place 2 sprays into both nostrils daily as needed for allergies.      hydrALAZINE (APRESOLINE) 10 MG tablet Take 10 mg three times a day only if Systolic blood pressure greater than 180 (Patient taking differently: Take 10 mg three times a day only if Systolic blood pressure is 180 or greater) 90 tablet 6   lidocaine (LIDODERM) 5 % Place 1 patch onto the skin daily as needed. Remove & Discard patch within 12 hours or as directed by MD     Magnesium Hydroxide (PHILLIPS MILK OF MAGNESIA PO) Take 1-2 tablets by mouth  2 (two) times daily as needed.     Multiple Vitamins-Minerals (PRESERVISION AREDS) CAPS 1 capsule     No current facility-administered medications for this visit.    Allergies:   Hctz [hydrochlorothiazide], Losartan, Macrolides and ketolides, Amlodipine, Benazepril, and Penicillins    ROS:  Please see the history of present illness.   Otherwise, review of systems are positive for none.   All other systems are reviewed and negative.    PHYSICAL EXAM: VS:  BP (!) 143/84   Pulse 77   Ht '5\' 4"'$  (1.626 m)   Wt 181 lb (82.1 kg)   SpO2 97%   BMI 31.07 kg/m  , BMI Body mass index is 31.07 kg/m.  GENERAL:  Well appearing NECK:  No jugular venous distention, waveform within normal limits, carotid upstroke brisk and symmetric, no bruits, no thyromegaly LUNGS:  Clear to auscultation bilaterally CHEST:   Unremarkable HEART:  PMI not displaced or sustained,S1 and S2 within normal limits, no S3, no S4, no clicks, no rubs, 2 out of 6 brief apical systolic murmur radiating slightly at the aortic outflow tract, no diastolic murmurs ABD:  Flat, positive bowel sounds normal in frequency in pitch, no bruits, no rebound, no guarding, no midline pulsatile mass, no hepatomegaly, no splenomegaly EXT:  2 plus pulses throughout, no edema, no cyanosis no clubbing    EKG:  EKG is not ordered today.  Recent Labs: 04/29/2022: BNP 57.9      Wt Readings from Last 3 Encounters:  08/23/22 181 lb (82.1 kg)  04/29/22 177 lb (80.3 kg)  03/03/22 176 lb 9.6 oz (80.1 kg)      Other studies Reviewed: Additional studies/ records that were reviewed today include:   EP device follow up  Review of the above records demonstrates:      ASSESSMENT AND PLAN:   Complete heart block/Status post pacemaker [name] [name] is up-to-date and a sensed V paced 85% of the time and the rest of the time is a paced V paced.  I reviewed the most recent download from the 13th.  HTN Her blood pressure is labile.  [name] [name] occasionally but rarely has to take the hydralazine and I think this works well for her.    AS This was mild in Nov 2021.  I would not suspect on exam that this is different and so no further imaging.  SOB This is improved since her pacemaker.  BNP previously was normal.  No further cardiovascular testing is suggested.   Current medicines are reviewed at length with the patient today.  The patient does not have concerns regarding medicines.  The following changes have been made:   None  Labs/ tests ordered today include:     No orders of the defined types were placed in this encounter.   Disposition:   FU with me  in 12 months.   Signed, [name] Breeding, MD  08/23/2022 12:58 PM    [facility]

## 2022-08-23 ENCOUNTER — Ambulatory Visit: Payer: Medicare PPO | Attending: Cardiology | Admitting: Cardiology

## 2022-08-23 ENCOUNTER — Encounter: Payer: Self-pay | Admitting: Cardiology

## 2022-08-23 VITALS — BP 143/84 | HR 77 | Ht 64.0 in | Wt 181.0 lb

## 2022-08-23 DIAGNOSIS — I10 Essential (primary) hypertension: Secondary | ICD-10-CM

## 2022-08-23 DIAGNOSIS — I442 Atrioventricular block, complete: Secondary | ICD-10-CM

## 2022-08-23 DIAGNOSIS — R0602 Shortness of breath: Secondary | ICD-10-CM

## 2022-08-23 DIAGNOSIS — I350 Nonrheumatic aortic (valve) stenosis: Secondary | ICD-10-CM | POA: Diagnosis not present

## 2022-08-23 NOTE — Patient Instructions (Signed)
Medication Instructions:  Your physician recommends that you continue on your current medications as directed. Please refer to the Current Medication list given to you today.  *If you need a refill on your cardiac medications before your next appointment, please call your pharmacy*   Follow-Up: At [facility], you and your health needs are our priority.  As part of our continuing mission to provide you with exceptional heart care, we have created designated Provider Care Teams.  These Care Teams include your primary Cardiologist (physician) and Advanced Practice Providers (APPs -  Physician Assistants and Nurse Practitioners) who all work together to provide you with the care you need, when you need it.  We recommend signing up for the patient portal called "MyChart".  Sign up information is provided on this After Visit Summary.  MyChart is used to connect with patients for Virtual Visits (Telemedicine).  Patients are able to view lab/test results, encounter notes, upcoming appointments, etc.  Non-urgent messages can be sent to your provider as well.   To learn more about what you can do with MyChart, go to [email or web address].    Your next appointment:   1 year(s)  The format for your next appointment:   In Person  Provider:   [name], MD      Important Information About Sugar

## 2022-09-13 ENCOUNTER — Encounter (INDEPENDENT_AMBULATORY_CARE_PROVIDER_SITE_OTHER): Payer: Medicare PPO | Admitting: Ophthalmology

## 2022-09-13 DIAGNOSIS — H35033 Hypertensive retinopathy, bilateral: Secondary | ICD-10-CM | POA: Diagnosis not present

## 2022-09-13 DIAGNOSIS — H353231 Exudative age-related macular degeneration, bilateral, with active choroidal neovascularization: Secondary | ICD-10-CM | POA: Diagnosis not present

## 2022-09-13 DIAGNOSIS — H43813 Vitreous degeneration, bilateral: Secondary | ICD-10-CM

## 2022-09-13 DIAGNOSIS — D3131 Benign neoplasm of right choroid: Secondary | ICD-10-CM

## 2022-09-13 DIAGNOSIS — I10 Essential (primary) hypertension: Secondary | ICD-10-CM

## 2022-09-15 NOTE — Progress Notes (Signed)
Remote pacemaker transmission.   

## 2022-10-18 ENCOUNTER — Encounter (INDEPENDENT_AMBULATORY_CARE_PROVIDER_SITE_OTHER): Payer: Medicare PPO | Admitting: Ophthalmology

## 2022-10-18 DIAGNOSIS — H353231 Exudative age-related macular degeneration, bilateral, with active choroidal neovascularization: Secondary | ICD-10-CM | POA: Diagnosis not present

## 2022-10-18 DIAGNOSIS — D3131 Benign neoplasm of right choroid: Secondary | ICD-10-CM

## 2022-10-18 DIAGNOSIS — H35033 Hypertensive retinopathy, bilateral: Secondary | ICD-10-CM | POA: Diagnosis not present

## 2022-10-18 DIAGNOSIS — I10 Essential (primary) hypertension: Secondary | ICD-10-CM

## 2022-10-18 DIAGNOSIS — H43813 Vitreous degeneration, bilateral: Secondary | ICD-10-CM | POA: Diagnosis not present

## 2022-10-29 ENCOUNTER — Telehealth: Payer: Self-pay

## 2022-10-29 NOTE — Patient Outreach (Signed)
  Care Coordination   Initial Visit Note   10/29/2022 Name: Tyrell Delpino MRN: [id number] DOB: 1935/11/30  Montine Stiglich is a 87 y.o. year old female who sees [name], MD for primary care. I spoke with  [name] by phone today.  What matters to the patients health and wellness today?  I want to be able to walk around the block States she gets tired very easily and has some shortness of breath when she walks very far.  States she checks her B/P when she does not feel right but her B/P has been good recently.      Goals Addressed             This Visit's Progress    Care Coordination Activities-HTN and improve exercise tolerance       Interventions Today    Flowsheet Row Most Recent Value  Chronic Disease   Chronic disease during today's visit Hypertension (HTN), Other  [complete heart block]  General Interventions   General Interventions Discussed/Reviewed General Interventions Discussed, Durable Medical Equipment (DME), Doctor Visits  Doctor Visits Discussed/Reviewed Doctor Visits Discussed, Annual Wellness Visits, PCP  Durable Medical Equipment (DME) Other, BP Cuff  [cane]  PCP/Specialist Visits Compliance with follow-up visit  Exercise Interventions   Exercise Discussed/Reviewed Exercise Discussed, Assistive device use and maintanence  [Discussed using cane and to try to increase walking time as tolerated]  Education Interventions   Education Provided Provided Education, Provided Geophysical data processor On Nutrition, Exercise, Other  [fall safety]  Nutrition Interventions   Nutrition Discussed/Reviewed Nutrition Discussed, Decreasing salt  Safety Interventions   Safety Discussed/Reviewed Safety Discussed, Fall Risk, Home Safety  Home Safety Assistive Devices              SDOH assessments and interventions completed:  Yes  SDOH Interventions Today    Flowsheet Row Most Recent Value  SDOH Interventions   Food Insecurity  Interventions Intervention Not Indicated  Housing Interventions Intervention Not Indicated  Transportation Interventions Intervention Not Indicated  Utilities Interventions Intervention Not Indicated  Physical Activity Interventions Other (Comments)  [Reviewed insurance exercise benefit]        Care Coordination Interventions:  Yes, provided   Follow up plan: Follow up call scheduled for 12/13/22    Encounter Outcome:  Pt. Visit Completed  Peter Garter RN, [facility], [place] Management [phone]

## 2022-10-29 NOTE — Patient Instructions (Signed)
Visit Information  Thank you for taking time to visit with me today. Please don't hesitate to contact me if I can be of assistance to you.   Following are the goals we discussed today:   Goals Addressed             This Visit's Progress    Care Coordination Activities-HTN and improve exercise tolerance       Interventions Today    Flowsheet Row Most Recent Value  Chronic Disease   Chronic disease during today's visit Hypertension (HTN), Other  [complete heart block]  General Interventions   General Interventions Discussed/Reviewed General Interventions Discussed, Durable Medical Equipment (DME), Doctor Visits  Doctor Visits Discussed/Reviewed Doctor Visits Discussed, Annual Wellness Visits, PCP  Durable Medical Equipment (DME) Other, BP Cuff  [cane]  PCP/Specialist Visits Compliance with follow-up visit  Exercise Interventions   Exercise Discussed/Reviewed Exercise Discussed, Assistive device use and maintanence  [Discussed using cane and to try to increase walking time as tolerated]  Education Interventions   Education Provided Provided Education, Provided Engineer, site  Provided Verbal Education On Nutrition, Exercise, Other  [fall safety]  Nutrition Interventions   Nutrition Discussed/Reviewed Nutrition Discussed, Decreasing salt  Safety Interventions   Safety Discussed/Reviewed Safety Discussed, Fall Risk, Home Safety  Home Safety Assistive Devices              Our next appointment is by telephone on 12/13/22 at 11:30 AM  Please call the care guide team at [phone] if you need to cancel or reschedule your appointment.   If you are experiencing a Mental Health or [place] or need someone to talk to, please call the Suicide and Crisis Lifeline: 988 call the [place] National Suicide Prevention Lifeline: 616-171-3405 or TTY: 915-106-0069 TTY 414 436 7262) to talk to a trained counselor call 1-800-273-TALK (toll free, 24 hour hotline) go to  [facility] [address], [place] [phone]) call 911   The patient verbalized understanding of instructions, educational materials, and care plan provided today and agreed to receive a mailed copy of patient instructions, educational materials, and care plan.   Telephone follow up appointment with care management team member scheduled for:12/13/22  Peter Garter RN, [facility], CDE Care Management Coordinator [place] [facility] [phone]

## 2022-11-03 DIAGNOSIS — H35033 Hypertensive retinopathy, bilateral: Secondary | ICD-10-CM | POA: Diagnosis not present

## 2022-11-03 DIAGNOSIS — H43811 Vitreous degeneration, right eye: Secondary | ICD-10-CM | POA: Diagnosis not present

## 2022-11-03 DIAGNOSIS — H353231 Exudative age-related macular degeneration, bilateral, with active choroidal neovascularization: Secondary | ICD-10-CM | POA: Diagnosis not present

## 2022-11-03 DIAGNOSIS — H26493 Other secondary cataract, bilateral: Secondary | ICD-10-CM | POA: Diagnosis not present

## 2022-11-16 ENCOUNTER — Ambulatory Visit (INDEPENDENT_AMBULATORY_CARE_PROVIDER_SITE_OTHER): Payer: Medicare PPO

## 2022-11-16 DIAGNOSIS — I442 Atrioventricular block, complete: Secondary | ICD-10-CM

## 2022-11-18 LAB — CUP PACEART REMOTE DEVICE CHECK
Battery Impedance: 1118 Ohm
Battery Remaining Longevity: 58 mo
Battery Voltage: 2.77 V
Brady Statistic AP VP Percent: 14 %
Brady Statistic AP VS Percent: 0 %
Brady Statistic AS VP Percent: 86 %
Brady Statistic AS VS Percent: 0 %
Date Time Interrogation Session: 20240312181046
Implantable Lead Connection Status: 753985
Implantable Lead Connection Status: 753985
Implantable Lead Implant Date: 20160512
Implantable Lead Implant Date: 20160512
Implantable Lead Location: 753859
Implantable Lead Location: 753860
Implantable Lead Model: 5076
Implantable Lead Model: 5092
Implantable Pulse Generator Implant Date: 20160512
Lead Channel Impedance Value: 490 Ohm
Lead Channel Impedance Value: 859 Ohm
Lead Channel Pacing Threshold Amplitude: 0.625 V
Lead Channel Pacing Threshold Amplitude: 0.875 V
Lead Channel Pacing Threshold Pulse Width: 0.4 ms
Lead Channel Pacing Threshold Pulse Width: 0.4 ms
Lead Channel Setting Pacing Amplitude: 2 V
Lead Channel Setting Pacing Amplitude: 2.5 V
Lead Channel Setting Pacing Pulse Width: 0.4 ms
Lead Channel Setting Sensing Sensitivity: 4 mV
Zone Setting Status: 755011
Zone Setting Status: 755011

## 2022-11-22 ENCOUNTER — Encounter (INDEPENDENT_AMBULATORY_CARE_PROVIDER_SITE_OTHER): Payer: Medicare PPO | Admitting: Ophthalmology

## 2022-11-22 DIAGNOSIS — I10 Essential (primary) hypertension: Secondary | ICD-10-CM | POA: Diagnosis not present

## 2022-11-22 DIAGNOSIS — H353231 Exudative age-related macular degeneration, bilateral, with active choroidal neovascularization: Secondary | ICD-10-CM

## 2022-11-22 DIAGNOSIS — H43813 Vitreous degeneration, bilateral: Secondary | ICD-10-CM | POA: Diagnosis not present

## 2022-11-22 DIAGNOSIS — D3131 Benign neoplasm of right choroid: Secondary | ICD-10-CM

## 2022-12-13 ENCOUNTER — Ambulatory Visit: Payer: Self-pay

## 2022-12-13 NOTE — Patient Instructions (Signed)
Visit Information  Thank you for taking time to visit with me today. Please don't hesitate to contact me if I can be of assistance to you.   Following are the goals we discussed today:   Goals Addressed             This Visit's Progress    Care Coordination Activities-HTN and improve exercise tolerance       Interventions Today    Flowsheet Row Most Recent Value  Chronic Disease   Chronic disease during today's visit Hypertension (HTN), Other  [complete heart block]  General Interventions   General Interventions Discussed/Reviewed General Interventions Reviewed, Doctor Visits  Doctor Visits Discussed/Reviewed Doctor Visits Reviewed, Annual Wellness Visits, PCP, Specialist  PCP/Specialist Visits Compliance with follow-up visit  [Discussed to call and schedule follow up visits with PCP and electrophysiologist]  Exercise Interventions   Exercise Discussed/Reviewed Exercise Reviewed, Physical Activity  Physical Activity Discussed/Reviewed Physical Activity Reviewed, Types of exercise  [Reviewed to continue to do strengthening exercises and encouraged to start back walking]  Education Interventions   Education Provided Provided Education  Provided Verbal Education On Nutrition, Exercise, When to see the doctor, Other  [Reviewed to check B/P regularly]  Nutrition Interventions   Nutrition Discussed/Reviewed Nutrition Reviewed, Decreasing salt  Safety Interventions   Safety Discussed/Reviewed Safety Reviewed, Fall Risk              Our next appointment is by telephone on 01/19/23 at 11:30 PM  Please call the care guide team at [phone] if you need to cancel or reschedule your appointment.   If you are experiencing a Mental Health or Behavioral Health Crisis or need someone to talk to, please call the Suicide and Crisis Lifeline: 988 call the [place] National Suicide Prevention Lifeline: 951-596-7266 or TTY: 7603369993 TTY 580-025-8950) to talk to a trained counselor call  1-800-273-TALK (toll free, 24 hour hotline) go to [facility] [address] [place] ([phone]) call 911   The patient verbalized understanding of instructions, educational materials, and care plan provided today and agreed to receive a mailed copy of patient instructions, educational materials, and care plan.   Telephone follow up appointment with care management team member scheduled for: 01/19/23  SIGNATURE Dudley Major RN, Maximiano Coss, CDE Care Management Coordinator [facility] Care Management [phone]

## 2022-12-13 NOTE — Patient Outreach (Signed)
  Care Coordination   Follow Up Visit Note   12/13/2022 Name: [name] [name] MRN: [id number] DOB: 05-01-1936  [name] [name] is a 87 y.o. year old female who sees Marden Noble, MD for primary care. I spoke with  [name] [name] by phone today.  What matters to the patients health and wellness today?  States she has been doing good. Denies any falls.  States she has not been walking but she is trying to do leg exercises that PT taught her daily.  States she has not had any increase in her shortness of breath and sometimes she does not have shortness of breath when she goes up her stairs.    Goals Addressed             This Visit's Progress    Care Coordination Activities-HTN and improve exercise tolerance       Interventions Today    Flowsheet Row Most Recent Value  Chronic Disease   Chronic disease during today's visit Hypertension (HTN), Other  [complete heart block]  General Interventions   General Interventions Discussed/Reviewed General Interventions Reviewed, Doctor Visits  Doctor Visits Discussed/Reviewed Doctor Visits Reviewed, Annual Wellness Visits, PCP, Specialist  PCP/Specialist Visits Compliance with follow-up visit  [Discussed to call and schedule follow up visits with PCP and electrophysiologist]  Exercise Interventions   Exercise Discussed/Reviewed Exercise Reviewed, Physical Activity  Physical Activity Discussed/Reviewed Physical Activity Reviewed, Types of exercise  [Reviewed to continue to do strengthening exercises and encouraged to start back walking]  Education Interventions   Education Provided Provided Education  Provided Verbal Education On Nutrition, Exercise, When to see the doctor, Other  [Reviewed to check B/P regularly]  Nutrition Interventions   Nutrition Discussed/Reviewed Nutrition Reviewed, Decreasing salt  Safety Interventions   Safety Discussed/Reviewed Safety Reviewed, Fall Risk              SDOH assessments and interventions completed:   No     Care Coordination Interventions:  Yes, provided   Follow up plan: Follow up call scheduled for 01/19/23    Encounter Outcome:  Pt. Visit Completed  Dudley Major RN, [facility], CDE Care Management Coordinator [facility] Care Management [phone]

## 2022-12-17 DIAGNOSIS — M7989 Other specified soft tissue disorders: Secondary | ICD-10-CM | POA: Diagnosis not present

## 2022-12-23 DIAGNOSIS — R600 Localized edema: Secondary | ICD-10-CM | POA: Diagnosis not present

## 2022-12-24 NOTE — Progress Notes (Signed)
Remote pacemaker transmission.   

## 2022-12-27 ENCOUNTER — Encounter (INDEPENDENT_AMBULATORY_CARE_PROVIDER_SITE_OTHER): Payer: Medicare PPO | Admitting: Ophthalmology

## 2022-12-27 DIAGNOSIS — D3131 Benign neoplasm of right choroid: Secondary | ICD-10-CM | POA: Diagnosis not present

## 2022-12-27 DIAGNOSIS — H35033 Hypertensive retinopathy, bilateral: Secondary | ICD-10-CM | POA: Diagnosis not present

## 2022-12-27 DIAGNOSIS — H353231 Exudative age-related macular degeneration, bilateral, with active choroidal neovascularization: Secondary | ICD-10-CM | POA: Diagnosis not present

## 2022-12-27 DIAGNOSIS — I10 Essential (primary) hypertension: Secondary | ICD-10-CM

## 2022-12-27 DIAGNOSIS — H43813 Vitreous degeneration, bilateral: Secondary | ICD-10-CM | POA: Diagnosis not present

## 2022-12-30 DIAGNOSIS — I872 Venous insufficiency (chronic) (peripheral): Secondary | ICD-10-CM | POA: Diagnosis not present

## 2022-12-30 DIAGNOSIS — L03116 Cellulitis of left lower limb: Secondary | ICD-10-CM | POA: Diagnosis not present

## 2022-12-30 DIAGNOSIS — R030 Elevated blood-pressure reading, without diagnosis of hypertension: Secondary | ICD-10-CM | POA: Diagnosis not present

## 2023-01-03 DIAGNOSIS — Z1331 Encounter for screening for depression: Secondary | ICD-10-CM | POA: Diagnosis not present

## 2023-01-03 DIAGNOSIS — L03116 Cellulitis of left lower limb: Secondary | ICD-10-CM | POA: Diagnosis not present

## 2023-01-03 DIAGNOSIS — Z950 Presence of cardiac pacemaker: Secondary | ICD-10-CM | POA: Diagnosis not present

## 2023-01-03 DIAGNOSIS — G4700 Insomnia, unspecified: Secondary | ICD-10-CM | POA: Diagnosis not present

## 2023-01-03 DIAGNOSIS — I872 Venous insufficiency (chronic) (peripheral): Secondary | ICD-10-CM | POA: Diagnosis not present

## 2023-01-03 DIAGNOSIS — Z79899 Other long term (current) drug therapy: Secondary | ICD-10-CM | POA: Diagnosis not present

## 2023-01-03 DIAGNOSIS — E559 Vitamin D deficiency, unspecified: Secondary | ICD-10-CM | POA: Diagnosis not present

## 2023-01-03 DIAGNOSIS — Z9181 History of falling: Secondary | ICD-10-CM | POA: Diagnosis not present

## 2023-01-03 DIAGNOSIS — I10 Essential (primary) hypertension: Secondary | ICD-10-CM | POA: Diagnosis not present

## 2023-01-03 DIAGNOSIS — Z0000 Encounter for general adult medical examination without abnormal findings: Secondary | ICD-10-CM | POA: Diagnosis not present

## 2023-01-03 DIAGNOSIS — F419 Anxiety disorder, unspecified: Secondary | ICD-10-CM | POA: Diagnosis not present

## 2023-01-03 DIAGNOSIS — I495 Sick sinus syndrome: Secondary | ICD-10-CM | POA: Diagnosis not present

## 2023-01-19 ENCOUNTER — Ambulatory Visit: Payer: Self-pay

## 2023-01-19 NOTE — Patient Outreach (Signed)
  Care Coordination   Follow Up Visit Note   01/19/2023 Name: [name] [name] MRN: [id number] DOB: [birth year]  [name] [name] is a 87 y.o. year old female who sees Emilio Aspen, MD for primary care. I spoke with  Festus Aloe by phone today.  What matters to the patients health and wellness today?  States her leg swelling is some better and her doctor told her she and resume her regular activities.    Goals Addressed             This Visit's Progress    COMPLETED: Care Coordination Activities-HTN and improve exercise tolerance       Interventions Today    Flowsheet Row Most Recent Value  Chronic Disease   Chronic disease during today's visit Hypertension (HTN), Other  [complete heart block]  General Interventions   General Interventions Discussed/Reviewed General Interventions Reviewed, Doctor Visits  [Reviewed that goals have been met and RNCM case closed]  Doctor Visits Discussed/Reviewed Doctor Visits Reviewed, PCP, Specialist  PCP/Specialist Visits Compliance with follow-up visit  [Reinforced to keep appts with cardiology and PCP]  Exercise Interventions   Exercise Discussed/Reviewed Exercise Reviewed, Physical Activity  Physical Activity Discussed/Reviewed Physical Activity Reviewed  [Reviewed to remain as active as tolerated and to pace activity]  Education Interventions   Education Provided Provided Education  Provided Verbal Education On Nutrition, Exercise, When to see the doctor  Nutrition Interventions   Nutrition Discussed/Reviewed Nutrition Reviewed, Decreasing salt  Pharmacy Interventions   Pharmacy Dicussed/Reviewed Pharmacy Topics Reviewed  Safety Interventions   Safety Discussed/Reviewed Safety Reviewed, Fall Risk              SDOH assessments and interventions completed:  No     Care Coordination Interventions:  Yes, provided   Follow up plan: No further intervention required. Goals met and case closed  Encounter Outcome:  Pt. Visit  Completed  Dudley Major RN, BSN,CCM, CDE Care Management Coordinator [facility] Care Management [phone]

## 2023-01-19 NOTE — Patient Instructions (Signed)
Visit Information  Thank you for taking time to visit with me today. Please don't hesitate to contact me if I can be of assistance to you.   Following are the goals we discussed today:   Goals Addressed             This Visit's Progress    COMPLETED: Care Coordination Activities-HTN and improve exercise tolerance       Interventions Today    Flowsheet Row Most Recent Value  Chronic Disease   Chronic disease during today's visit Hypertension (HTN), Other  [complete heart block]  General Interventions   General Interventions Discussed/Reviewed General Interventions Reviewed, Doctor Visits  [Reviewed that goals have been met and RNCM case closed]  Doctor Visits Discussed/Reviewed Doctor Visits Reviewed, PCP, Specialist  PCP/Specialist Visits Compliance with follow-up visit  [Reinforced to keep appts with cardiology and PCP]  Exercise Interventions   Exercise Discussed/Reviewed Exercise Reviewed, Physical Activity  Physical Activity Discussed/Reviewed Physical Activity Reviewed  [Reviewed to remain as active as tolerated and to pace activity]  Education Interventions   Education Provided Provided Education  Provided Verbal Education On Nutrition, Exercise, When to see the doctor  Nutrition Interventions   Nutrition Discussed/Reviewed Nutrition Reviewed, Decreasing salt  Pharmacy Interventions   Pharmacy Dicussed/Reviewed Pharmacy Topics Reviewed  Safety Interventions   Safety Discussed/Reviewed Safety Reviewed, Fall Risk               If you are experiencing a Mental Health or Behavioral Health Crisis or need someone to talk to, please call the Suicide and Crisis Lifeline: 988 call the [place] National Suicide Prevention Lifeline: 662 386 4384 or TTY: 828-348-7131 TTY (262)273-6030) to talk to a trained counselor call 1-800-273-TALK (toll free, 24 hour hotline) go to [facility] [address] [place] [phone]) call 911   The  patient verbalized understanding of instructions, educational materials, and care plan provided today and DECLINED offer to receive copy of patient instructions, educational materials, and care plan.   No further follow up required:    SIGNATURE Dudley Major RN, Maximiano Coss, CDE Care Management Coordinator [facility] Care Management [phone]

## 2023-02-02 ENCOUNTER — Encounter (INDEPENDENT_AMBULATORY_CARE_PROVIDER_SITE_OTHER): Payer: Medicare PPO | Admitting: Ophthalmology

## 2023-02-02 DIAGNOSIS — H353231 Exudative age-related macular degeneration, bilateral, with active choroidal neovascularization: Secondary | ICD-10-CM

## 2023-02-02 DIAGNOSIS — I10 Essential (primary) hypertension: Secondary | ICD-10-CM

## 2023-02-02 DIAGNOSIS — H35033 Hypertensive retinopathy, bilateral: Secondary | ICD-10-CM | POA: Diagnosis not present

## 2023-02-02 DIAGNOSIS — D3131 Benign neoplasm of right choroid: Secondary | ICD-10-CM

## 2023-02-02 DIAGNOSIS — H43813 Vitreous degeneration, bilateral: Secondary | ICD-10-CM

## 2023-02-15 ENCOUNTER — Ambulatory Visit: Payer: Medicare PPO

## 2023-02-15 DIAGNOSIS — I442 Atrioventricular block, complete: Secondary | ICD-10-CM | POA: Diagnosis not present

## 2023-02-16 LAB — CUP PACEART REMOTE DEVICE CHECK
Battery Impedance: 1198 Ohm
Battery Remaining Longevity: 55 mo
Battery Voltage: 2.77 V
Brady Statistic AP VP Percent: 14 %
Brady Statistic AP VS Percent: 0 %
Brady Statistic AS VP Percent: 86 %
Brady Statistic AS VS Percent: 0 %
Date Time Interrogation Session: 20240612161404
Implantable Lead Connection Status: 753985
Implantable Lead Connection Status: 753985
Implantable Lead Implant Date: 20160512
Implantable Lead Implant Date: 20160512
Implantable Lead Location: 753859
Implantable Lead Location: 753860
Implantable Lead Model: 5076
Implantable Lead Model: 5092
Implantable Pulse Generator Implant Date: 20160512
Lead Channel Impedance Value: 498 Ohm
Lead Channel Impedance Value: 779 Ohm
Lead Channel Pacing Threshold Amplitude: 0.75 V
Lead Channel Pacing Threshold Amplitude: 0.875 V
Lead Channel Pacing Threshold Pulse Width: 0.4 ms
Lead Channel Pacing Threshold Pulse Width: 0.4 ms
Lead Channel Setting Pacing Amplitude: 2 V
Lead Channel Setting Pacing Amplitude: 2.5 V
Lead Channel Setting Pacing Pulse Width: 0.4 ms
Lead Channel Setting Sensing Sensitivity: 4 mV
Zone Setting Status: 755011
Zone Setting Status: 755011

## 2023-02-28 DIAGNOSIS — R748 Abnormal levels of other serum enzymes: Secondary | ICD-10-CM | POA: Diagnosis not present

## 2023-03-07 ENCOUNTER — Encounter (INDEPENDENT_AMBULATORY_CARE_PROVIDER_SITE_OTHER): Payer: Medicare PPO | Admitting: Ophthalmology

## 2023-03-07 DIAGNOSIS — H43813 Vitreous degeneration, bilateral: Secondary | ICD-10-CM | POA: Diagnosis not present

## 2023-03-07 DIAGNOSIS — D3131 Benign neoplasm of right choroid: Secondary | ICD-10-CM | POA: Diagnosis not present

## 2023-03-07 DIAGNOSIS — I10 Essential (primary) hypertension: Secondary | ICD-10-CM

## 2023-03-07 DIAGNOSIS — H35033 Hypertensive retinopathy, bilateral: Secondary | ICD-10-CM | POA: Diagnosis not present

## 2023-03-07 DIAGNOSIS — H353231 Exudative age-related macular degeneration, bilateral, with active choroidal neovascularization: Secondary | ICD-10-CM

## 2023-03-15 ENCOUNTER — Ambulatory Visit: Payer: Medicare PPO | Attending: Internal Medicine | Admitting: Internal Medicine

## 2023-03-15 ENCOUNTER — Encounter: Payer: Self-pay | Admitting: Internal Medicine

## 2023-03-15 VITALS — BP 161/77 | HR 72 | Ht 64.0 in | Wt 179.4 lb

## 2023-03-15 DIAGNOSIS — Z959 Presence of cardiac and vascular implant and graft, unspecified: Secondary | ICD-10-CM

## 2023-03-15 DIAGNOSIS — I442 Atrioventricular block, complete: Secondary | ICD-10-CM | POA: Diagnosis not present

## 2023-03-15 LAB — CUP PACEART INCLINIC DEVICE CHECK
Battery Impedance: 1226 Ohm
Battery Remaining Longevity: 54 mo
Battery Voltage: 2.76 V
Brady Statistic AP VP Percent: 14 %
Brady Statistic AP VS Percent: 0 %
Brady Statistic AS VP Percent: 86 %
Brady Statistic AS VS Percent: 0 %
Date Time Interrogation Session: 20240709172531
Implantable Lead Connection Status: 753985
Implantable Lead Connection Status: 753985
Implantable Lead Implant Date: 20160512
Implantable Lead Implant Date: 20160512
Implantable Lead Location: 753859
Implantable Lead Location: 753860
Implantable Lead Model: 5076
Implantable Lead Model: 5092
Implantable Pulse Generator Implant Date: 20160512
Lead Channel Impedance Value: 505 Ohm
Lead Channel Impedance Value: 730 Ohm
Lead Channel Pacing Threshold Amplitude: 0.75 V
Lead Channel Pacing Threshold Amplitude: 0.875 V
Lead Channel Pacing Threshold Pulse Width: 0.4 ms
Lead Channel Pacing Threshold Pulse Width: 0.4 ms
Lead Channel Sensing Intrinsic Amplitude: 1.4 mV
Lead Channel Setting Pacing Amplitude: 2 V
Lead Channel Setting Pacing Amplitude: 2.5 V
Lead Channel Setting Pacing Pulse Width: 0.4 ms
Lead Channel Setting Sensing Sensitivity: 4 mV
Zone Setting Status: 755011
Zone Setting Status: 755011

## 2023-03-15 NOTE — Progress Notes (Signed)
GYN she had last echo was 3 years ago     Patient Care Team: Emilio Aspen, MD as PCP - General (Internal Medicine) Rollene Rotunda, MD as PCP - Cardiology (Cardiology)   HPI  [name] [name] is a 87 y.o. female Seen in follow-up for pacemaker Medtronic  implanted for CHB and previously followed by [facility]  The patient denies chest pain, shortness of breath, nocturnal dyspnea, orthopnea or peripheral edema.  There have been no palpitations, lightheadedness or syncope.  Complains of "just giving out "I can be as little as after 50-100 ft  Denies dyspnea.  Has history of orthostatic hypotension prompting the discontinuation of all of her antihypertensives.  No further lightheadedness and no lightheadedness with the spells  DATE TEST EF   5/16 Echo   60-65 %   11/21 Echo   50-55 %              Date Cr K Hgb  2/22 0.88 4.8 14.5   4/24 0.97 4.6 13.1    Records and Results Reviewed   Past Medical History:  Diagnosis Date   Anxiety    Broken arm Right   Broken leg    Complete heart block (HCC)    Hypertension    Postural dizziness     Past Surgical History:  Procedure Laterality Date   EP IMPLANTABLE DEVICE N/A 01/16/2015   MDT Adapta L PPM implanted by Dr Johney Frame for complete heart block   IR GENERIC HISTORICAL  09/15/2016   IR RADIOLOGIST EVAL & MGMT 09/15/2016 [facility]   IR GENERIC HISTORICAL  09/28/2016   IR KYPHO THORACIC WITH BONE BIOPSY 09/28/2016 Julieanne Cotton, MD [facility]   IR GENERIC HISTORICAL  10/14/2016   IR RADIOLOGIST EVAL & MGMT 10/14/2016 [facility]   PACEMAKER INSERTION N/A 01/2015    Current Meds  Medication Sig   acetaminophen (TYLENOL) 500 MG tablet Take 500 mg by mouth every 6 (six) hours as needed for mild pain.   ALPRAZolam (XANAX) 0.25 MG tablet Take 0.125 tablets by mouth daily as needed for anxiety.    aspirin 325 MG tablet Take 650 mg by mouth at bedtime as needed for moderate pain. Will take at bedtime if needed for pain    aspirin 81 MG tablet Take 81 mg by mouth daily.   Besifloxacin HCl 0.6 % SUSP Apply 1 drop to eye 4 (four) times daily. For 2 days after injection   calcium carbonate (TUMS EX) 750 MG chewable tablet Chew 1 tablet by mouth 2 (two) times daily as needed.    Cholecalciferol (VITAMIN D3) 2000 units TABS Take 1 tablet by mouth daily.   famotidine (PEPCID) 20 MG tablet Take 10-20 mg by mouth daily.    faricimab-svoa (VABYSMO) 6 MG/0.05ML SOLN intravitreal injection 6 mg by Intravitreal route every 5 (five) weeks.   fluticasone (FLONASE) 50 MCG/ACT nasal spray Place 2 sprays into both nostrils daily as needed for allergies.    hydrALAZINE (APRESOLINE) 10 MG tablet Take 10 mg three times a day only if Systolic blood pressure greater than 180 (Patient taking differently: Take 10 mg three times a day only if Systolic blood pressure is 180 or greater)   lidocaine (LIDODERM) 5 % Place 1 patch onto the skin daily as needed. Remove & Discard patch within 12 hours or as directed by MD   Magnesium Hydroxide (PHILLIPS MILK OF MAGNESIA PO) Take 1-2 tablets by mouth 2 (two) times daily as needed.   Multiple Vitamins-Minerals (PRESERVISION AREDS)  CAPS 1 capsule    Allergies  Allergen Reactions   Hctz [Hydrochlorothiazide] Shortness Of Breath and Other (See Comments)    Very short winded, made her hurt all over    Losartan Other (See Comments)    Skin crawling feelings   Macrolides And Ketolides Other (See Comments)    Pyloric sphincter flareups-unsure of which -mycins   Amlodipine Other (See Comments)   Benazepril Other (See Comments)    Doesn't work for patient   Penicillins Other (See Comments)    abd pain Has patient had a PCN reaction causing immediate rash, facial/tongue/throat swelling, SOB or lightheadedness with hypotension: No Has patient had a PCN reaction causing severe rash involving mucus membranes or skin necrosis: No Has patient had a PCN reaction that required hospitalization No Has patient  had a PCN reaction occurring within the last 10 years: No If all of the above answers are "NO", then may proceed with Cephalosporin use.        Review of Systems negative except from HPI and PMH  Physical Exam BP 116/70   Pulse 73   Ht 5\' 4"  (1.626 m)   Wt 179 lb 6.4 oz (81.4 kg)   SpO2 94%   BMI 30.79 kg/m  Well developed and well nourished in no acute distress HENT normal Neck supple with JVP-flat Clear Device pocket well healed; without hematoma or erythema.  There is no tethering  Regular rate and rhythm, no  gallop No  murmur Abd-soft with active BS No Clubbing cyanosis  edema Skin-warm and dry A & Oriented  Grossly normal sensory and motor function  ECG sinus with P synchronous pacing  Device function is normal. Programming changes   See Paceart for details     CrCl cannot be calculated (Patient's most recent lab result is older than the maximum 21 days allowed.).   Assessment and  Plan Complete heart block  Pacemaker  medtronic   Hypertension   Orthostatic lightheadedness   Exercise intolerance    Device function is normal.  Blood pressure is quite variable.  Orthostatic vital signs were repeated with blood pressures in the 160-170 range.  This lability has been previously identified and symptomatic orthostatic hypotension has been eliminated by the elimination of her antihypertensives except for as needed hydralazine; we will continue it.  Her functional incapacity may be related to volume; however, given her propensity to falling I would prefer some dyspnea to that.  We will not use diuretics at this point.     Current medicines are reviewed at length with the patient today .  The patient does not  have concerns regarding medicines.

## 2023-03-15 NOTE — Patient Instructions (Signed)
Medication Instructions:  Your physician recommends that you continue on your current medications as directed. Please refer to the Current Medication list given to you today.  *If you need a refill on your cardiac medications before your next appointment, please call your pharmacy*  Follow-Up: At [facility], you and your health needs are our priority.  As part of our continuing mission to provide you with exceptional heart care, we have created designated Provider Care Teams.  These Care Teams include your primary Cardiologist (physician) and Advanced Practice Providers (APPs -  Physician Assistants and Nurse Practitioners) who all work together to provide you with the care you need, when you need it.  Your next appointment:   1 year(s)  Provider:   You may see Dr. Sherryl Manges or one of the following Advanced Practice Providers on your designated Care Team:   Francis Dowse, PA-C [facility] Needle [address]" [place], [place] Sherie Don, NP Canary Brim, NP

## 2023-03-15 NOTE — Progress Notes (Signed)
Remote pacemaker transmission.   

## 2023-03-25 DIAGNOSIS — K8020 Calculus of gallbladder without cholecystitis without obstruction: Secondary | ICD-10-CM | POA: Diagnosis not present

## 2023-03-25 DIAGNOSIS — R748 Abnormal levels of other serum enzymes: Secondary | ICD-10-CM | POA: Diagnosis not present

## 2023-04-11 ENCOUNTER — Encounter (INDEPENDENT_AMBULATORY_CARE_PROVIDER_SITE_OTHER): Payer: Medicare PPO | Admitting: Ophthalmology

## 2023-04-11 DIAGNOSIS — D3131 Benign neoplasm of right choroid: Secondary | ICD-10-CM

## 2023-04-11 DIAGNOSIS — H353231 Exudative age-related macular degeneration, bilateral, with active choroidal neovascularization: Secondary | ICD-10-CM | POA: Diagnosis not present

## 2023-04-11 DIAGNOSIS — I10 Essential (primary) hypertension: Secondary | ICD-10-CM

## 2023-04-11 DIAGNOSIS — H35033 Hypertensive retinopathy, bilateral: Secondary | ICD-10-CM | POA: Diagnosis not present

## 2023-04-11 DIAGNOSIS — H43813 Vitreous degeneration, bilateral: Secondary | ICD-10-CM | POA: Diagnosis not present

## 2023-05-16 ENCOUNTER — Telehealth: Payer: Self-pay | Admitting: Cardiology

## 2023-05-16 ENCOUNTER — Encounter (INDEPENDENT_AMBULATORY_CARE_PROVIDER_SITE_OTHER): Payer: Medicare PPO | Admitting: Ophthalmology

## 2023-05-16 DIAGNOSIS — H353231 Exudative age-related macular degeneration, bilateral, with active choroidal neovascularization: Secondary | ICD-10-CM | POA: Diagnosis not present

## 2023-05-16 DIAGNOSIS — I10 Essential (primary) hypertension: Secondary | ICD-10-CM | POA: Diagnosis not present

## 2023-05-16 DIAGNOSIS — H35033 Hypertensive retinopathy, bilateral: Secondary | ICD-10-CM | POA: Diagnosis not present

## 2023-05-16 DIAGNOSIS — H43813 Vitreous degeneration, bilateral: Secondary | ICD-10-CM

## 2023-05-16 NOTE — Telephone Encounter (Signed)
  Patient states that she hasn't had much energy lately and would like to know if she could take Fish Oil supplement like Omega XL. Please advise.

## 2023-05-16 NOTE — Telephone Encounter (Signed)
 Returned call to pt. LVM to return call

## 2023-05-17 ENCOUNTER — Ambulatory Visit: Payer: Medicare PPO

## 2023-05-17 DIAGNOSIS — I442 Atrioventricular block, complete: Secondary | ICD-10-CM | POA: Diagnosis not present

## 2023-05-17 NOTE — Telephone Encounter (Signed)
Patient states less energy for about several months. She states tingling like her hands were numb when she was driving back from the beach (4 hours). Feels it in lower legs this morning, but none in her feet.   She states walking a block at the beach in the soft hand. She states this is mor than she usual walks.  She is able to do her normal work around the house and has a little more energy today. She can do things just has to rest more .  She ask if she can take Omega 3 and if this is not OK is there something else she can take for increased energy

## 2023-05-18 LAB — CUP PACEART REMOTE DEVICE CHECK
Battery Impedance: 1358 Ohm
Battery Remaining Longevity: 50 mo
Battery Voltage: 2.76 V
Brady Statistic AP VP Percent: 12 %
Brady Statistic AP VS Percent: 0 %
Brady Statistic AS VP Percent: 88 %
Brady Statistic AS VS Percent: 0 %
Date Time Interrogation Session: 20240911131430
Implantable Lead Connection Status: 753985
Implantable Lead Connection Status: 753985
Implantable Lead Implant Date: 20160512
Implantable Lead Implant Date: 20160512
Implantable Lead Location: 753859
Implantable Lead Location: 753860
Implantable Lead Model: 5076
Implantable Lead Model: 5092
Implantable Pulse Generator Implant Date: 20160512
Lead Channel Impedance Value: 490 Ohm
Lead Channel Impedance Value: 793 Ohm
Lead Channel Pacing Threshold Amplitude: 0.75 V
Lead Channel Pacing Threshold Amplitude: 0.875 V
Lead Channel Pacing Threshold Pulse Width: 0.4 ms
Lead Channel Pacing Threshold Pulse Width: 0.4 ms
Lead Channel Setting Pacing Amplitude: 2 V
Lead Channel Setting Pacing Amplitude: 2.5 V
Lead Channel Setting Pacing Pulse Width: 0.4 ms
Lead Channel Setting Sensing Sensitivity: 4 mV
Zone Setting Status: 755011
Zone Setting Status: 755011

## 2023-05-18 NOTE — Telephone Encounter (Signed)
Called patient and advised of information.  She states possibly see in Dr [name] sooner but then she does not have her calendar.  She then states maybe she will see a NP but she again cannot set appt now.  She will call back to schedule sooner appt.  She will reach out to her PCP as well.

## 2023-06-02 NOTE — Progress Notes (Signed)
Remote pacemaker transmission.   

## 2023-06-17 DIAGNOSIS — R600 Localized edema: Secondary | ICD-10-CM | POA: Diagnosis not present

## 2023-06-17 DIAGNOSIS — R32 Unspecified urinary incontinence: Secondary | ICD-10-CM | POA: Diagnosis not present

## 2023-06-17 DIAGNOSIS — M810 Age-related osteoporosis without current pathological fracture: Secondary | ICD-10-CM | POA: Diagnosis not present

## 2023-06-17 DIAGNOSIS — I10 Essential (primary) hypertension: Secondary | ICD-10-CM | POA: Diagnosis not present

## 2023-06-17 DIAGNOSIS — F419 Anxiety disorder, unspecified: Secondary | ICD-10-CM | POA: Diagnosis not present

## 2023-06-17 DIAGNOSIS — M1990 Unspecified osteoarthritis, unspecified site: Secondary | ICD-10-CM | POA: Diagnosis not present

## 2023-06-17 DIAGNOSIS — K219 Gastro-esophageal reflux disease without esophagitis: Secondary | ICD-10-CM | POA: Diagnosis not present

## 2023-06-17 DIAGNOSIS — E669 Obesity, unspecified: Secondary | ICD-10-CM | POA: Diagnosis not present

## 2023-06-17 DIAGNOSIS — R2681 Unsteadiness on feet: Secondary | ICD-10-CM | POA: Diagnosis not present

## 2023-06-20 ENCOUNTER — Encounter (INDEPENDENT_AMBULATORY_CARE_PROVIDER_SITE_OTHER): Payer: Medicare PPO | Admitting: Ophthalmology

## 2023-06-20 DIAGNOSIS — I10 Essential (primary) hypertension: Secondary | ICD-10-CM

## 2023-06-20 DIAGNOSIS — H35033 Hypertensive retinopathy, bilateral: Secondary | ICD-10-CM | POA: Diagnosis not present

## 2023-06-20 DIAGNOSIS — H43813 Vitreous degeneration, bilateral: Secondary | ICD-10-CM | POA: Diagnosis not present

## 2023-06-20 DIAGNOSIS — H353231 Exudative age-related macular degeneration, bilateral, with active choroidal neovascularization: Secondary | ICD-10-CM

## 2023-07-25 ENCOUNTER — Encounter (INDEPENDENT_AMBULATORY_CARE_PROVIDER_SITE_OTHER): Payer: Medicare PPO | Admitting: Ophthalmology

## 2023-07-25 DIAGNOSIS — D3131 Benign neoplasm of right choroid: Secondary | ICD-10-CM | POA: Diagnosis not present

## 2023-07-25 DIAGNOSIS — H43813 Vitreous degeneration, bilateral: Secondary | ICD-10-CM | POA: Diagnosis not present

## 2023-07-25 DIAGNOSIS — H35033 Hypertensive retinopathy, bilateral: Secondary | ICD-10-CM | POA: Diagnosis not present

## 2023-07-25 DIAGNOSIS — I10 Essential (primary) hypertension: Secondary | ICD-10-CM

## 2023-07-25 DIAGNOSIS — H353231 Exudative age-related macular degeneration, bilateral, with active choroidal neovascularization: Secondary | ICD-10-CM

## 2023-08-03 DIAGNOSIS — M5441 Lumbago with sciatica, right side: Secondary | ICD-10-CM | POA: Diagnosis not present

## 2023-08-16 ENCOUNTER — Ambulatory Visit (INDEPENDENT_AMBULATORY_CARE_PROVIDER_SITE_OTHER): Payer: Medicare PPO

## 2023-08-16 DIAGNOSIS — I442 Atrioventricular block, complete: Secondary | ICD-10-CM | POA: Diagnosis not present

## 2023-08-16 LAB — CUP PACEART REMOTE DEVICE CHECK
Battery Impedance: 1413 Ohm
Battery Remaining Longevity: 49 mo
Battery Voltage: 2.76 V
Brady Statistic AP VP Percent: 10 %
Brady Statistic AP VS Percent: 0 %
Brady Statistic AS VP Percent: 90 %
Brady Statistic AS VS Percent: 0 %
Date Time Interrogation Session: 20241210111446
Implantable Lead Connection Status: 753985
Implantable Lead Connection Status: 753985
Implantable Lead Implant Date: 20160512
Implantable Lead Implant Date: 20160512
Implantable Lead Location: 753859
Implantable Lead Location: 753860
Implantable Lead Model: 5076
Implantable Lead Model: 5092
Implantable Pulse Generator Implant Date: 20160512
Lead Channel Impedance Value: 484 Ohm
Lead Channel Impedance Value: 753 Ohm
Lead Channel Pacing Threshold Amplitude: 0.875 V
Lead Channel Pacing Threshold Amplitude: 0.875 V
Lead Channel Pacing Threshold Pulse Width: 0.4 ms
Lead Channel Pacing Threshold Pulse Width: 0.4 ms
Lead Channel Setting Pacing Amplitude: 2 V
Lead Channel Setting Pacing Amplitude: 2.5 V
Lead Channel Setting Pacing Pulse Width: 0.4 ms
Lead Channel Setting Sensing Sensitivity: 4 mV
Zone Setting Status: 755011
Zone Setting Status: 755011

## 2023-08-17 NOTE — Progress Notes (Signed)
  Cardiology Office Note:   Date:  08/19/2023  ID:  [name] [name], DOB [birth year], MRN [id number] PCP: Emilio Aspen, MD  [facility] Providers Cardiologist:  Rollene Rotunda, MD {  History of Present Illness:   [name] [name] is a 87 y.o. female who presents for evaluation of difficult to control.  She had heart block and did have a pacemaker placed.     Since I last saw her she is not aware K from a cardiac standpoint.  She had some mid abdominal pain today.  She had a little lightheadedness.  She is otherwise felt okay and has not had any new shortness of breath, PND or orthopnea.  She denies any presyncope or syncope.  She has no chest pressure, neck or arm discomfort.  She has had no weight gain or edema.  Of note her EKG today though difficult to interpret versus atrial flutter but her device interrogation done just 2 days ago shows none of this.  She has ventricular paced rhythm.  ROS: As stated in the HPI and positive for numbness in her hands, occasional leg cramping.  Studies Reviewed:    EKG:   EKG Interpretation Date/Time:  Thursday August 18 2023 15:13:37 EST Ventricular Rate:  78 PR Interval:  226 QRS Duration:  158 QT Interval:  440 QTC Calculation: 501 R Axis:   -69  Text Interpretation: Probable atrial flutter with ventricular pacing. When compared with ECG of 15-Mar-2023 15:54, Flutter is new Confirmed by Rollene Rotunda ([id number]) on 08/18/2023 5:45:03 PM    Risk Assessment/Calculations:       Physical Exam:   VS:  BP (!) 140/75 (BP Location: Right Arm, Patient Position: Sitting, Cuff Size: Large)   Pulse 78   Ht 5\' 4"  (1.626 m)   Wt 182 lb (82.6 kg)   SpO2 95%   BMI 31.24 kg/m    Wt Readings from Last 3 Encounters:  08/18/23 182 lb (82.6 kg)  03/15/23 179 lb 6.4 oz (81.4 kg)  08/23/22 181 lb (82.1 kg)     GEN: Well nourished, well developed in no acute distress NECK: No JVD; No carotid bruits CARDIAC: RRR, 3 out of 6 apical systolic  murmur radiating the aortic outflow tract and slightly to the axilla, no diastolic murmurs, rubs, gallops RESPIRATORY:  Clear to auscultation without rales, wheezing or rhonchi  ABDOMEN: Soft, non-tender, non-distended EXTREMITIES:  No edema; No deformity   ASSESSMENT AND PLAN:   Complete heart block/Status post pacemaker I spoke with device clinic today.  Would plan to go back and look at her device and see if there is any evidence of flutter on it might have to have a repeat interrogated.  The surface EKG is somewhat difficult to interpret but if there is a flutter she needs to be on Xarelto and off the aspirin.  I will call her back when we have resolution of this.  Addendum:  Device interrogation did not demonstrate atrial flutter.  No change in therapy.  No indication for DOAC.  Patient was notified.     HTN Her blood pressure is labile but typically well-controlled.  No change in therapy.    AS This was mild in Nov 2021.  Given her advanced age I am going to manage this conservatively.      Follow up with   Signed, Rollene Rotunda, MD

## 2023-08-18 ENCOUNTER — Encounter: Payer: Self-pay | Admitting: Cardiology

## 2023-08-18 ENCOUNTER — Ambulatory Visit: Payer: Medicare PPO | Attending: Cardiology | Admitting: Cardiology

## 2023-08-18 VITALS — BP 140/75 | HR 78 | Ht 64.0 in | Wt 182.0 lb

## 2023-08-18 DIAGNOSIS — I442 Atrioventricular block, complete: Secondary | ICD-10-CM | POA: Diagnosis not present

## 2023-08-18 DIAGNOSIS — I350 Nonrheumatic aortic (valve) stenosis: Secondary | ICD-10-CM

## 2023-08-18 DIAGNOSIS — I10 Essential (primary) hypertension: Secondary | ICD-10-CM | POA: Diagnosis not present

## 2023-08-18 NOTE — Patient Instructions (Signed)
Medication Instructions:  No changes.  *If you need a refill on your cardiac medications before your next appointment, please call your pharmacy*   Follow-Up: At [facility], you and your health needs are our priority.  As part of our continuing mission to provide you with exceptional heart care, we have created designated Provider Care Teams.  These Care Teams include your primary Cardiologist (physician) and Advanced Practice Providers (APPs -  Physician Assistants and Nurse Practitioners) who all work together to provide you with the care you need, when you need it.  Your next appointment:   3 month(s)  Provider:   Marjie Skiff, PA-C

## 2023-08-19 ENCOUNTER — Encounter: Payer: Self-pay | Admitting: Cardiology

## 2023-08-19 ENCOUNTER — Telehealth: Payer: Self-pay

## 2023-08-19 NOTE — Telephone Encounter (Signed)
Outreach made to Pt.  Advised she is not in atrial flutter.  Pt indicates understanding.

## 2023-08-19 NOTE — Telephone Encounter (Signed)
Remote transmission received and reviewed by this device nurse and EP APP.  Pt presenting rhythm is AS/VP.   No high atrial rate episodes noted.  Brief atrial arrhythmias noted, all less than 1 minute in duration.  Will forward to Dr. [name] for review.

## 2023-08-29 ENCOUNTER — Encounter (INDEPENDENT_AMBULATORY_CARE_PROVIDER_SITE_OTHER): Payer: Medicare PPO | Admitting: Ophthalmology

## 2023-08-29 DIAGNOSIS — H43813 Vitreous degeneration, bilateral: Secondary | ICD-10-CM | POA: Diagnosis not present

## 2023-08-29 DIAGNOSIS — H353231 Exudative age-related macular degeneration, bilateral, with active choroidal neovascularization: Secondary | ICD-10-CM

## 2023-08-29 DIAGNOSIS — D3131 Benign neoplasm of right choroid: Secondary | ICD-10-CM

## 2023-08-29 DIAGNOSIS — I10 Essential (primary) hypertension: Secondary | ICD-10-CM | POA: Diagnosis not present

## 2023-08-29 DIAGNOSIS — H35033 Hypertensive retinopathy, bilateral: Secondary | ICD-10-CM

## 2023-09-23 NOTE — Progress Notes (Signed)
Remote pacemaker transmission.   

## 2023-10-05 ENCOUNTER — Encounter (INDEPENDENT_AMBULATORY_CARE_PROVIDER_SITE_OTHER): Payer: Medicare PPO | Admitting: Ophthalmology

## 2023-10-05 DIAGNOSIS — H353231 Exudative age-related macular degeneration, bilateral, with active choroidal neovascularization: Secondary | ICD-10-CM

## 2023-10-05 DIAGNOSIS — H35033 Hypertensive retinopathy, bilateral: Secondary | ICD-10-CM

## 2023-10-05 DIAGNOSIS — I10 Essential (primary) hypertension: Secondary | ICD-10-CM

## 2023-10-05 DIAGNOSIS — H43813 Vitreous degeneration, bilateral: Secondary | ICD-10-CM

## 2023-11-09 ENCOUNTER — Encounter (INDEPENDENT_AMBULATORY_CARE_PROVIDER_SITE_OTHER): Payer: Medicare PPO | Admitting: Ophthalmology

## 2023-11-09 DIAGNOSIS — H353231 Exudative age-related macular degeneration, bilateral, with active choroidal neovascularization: Secondary | ICD-10-CM | POA: Diagnosis not present

## 2023-11-09 DIAGNOSIS — D3131 Benign neoplasm of right choroid: Secondary | ICD-10-CM | POA: Diagnosis not present

## 2023-11-09 DIAGNOSIS — H35033 Hypertensive retinopathy, bilateral: Secondary | ICD-10-CM

## 2023-11-09 DIAGNOSIS — H43813 Vitreous degeneration, bilateral: Secondary | ICD-10-CM

## 2023-11-09 DIAGNOSIS — I10 Essential (primary) hypertension: Secondary | ICD-10-CM | POA: Diagnosis not present

## 2023-11-15 ENCOUNTER — Ambulatory Visit (INDEPENDENT_AMBULATORY_CARE_PROVIDER_SITE_OTHER): Payer: Medicare PPO

## 2023-11-15 DIAGNOSIS — I442 Atrioventricular block, complete: Secondary | ICD-10-CM | POA: Diagnosis not present

## 2023-11-17 LAB — CUP PACEART REMOTE DEVICE CHECK
Battery Impedance: 1632 Ohm
Battery Remaining Longevity: 44 mo
Battery Voltage: 2.76 V
Brady Statistic AP VP Percent: 9 %
Brady Statistic AP VS Percent: 0 %
Brady Statistic AS VP Percent: 91 %
Brady Statistic AS VS Percent: 0 %
Date Time Interrogation Session: 20250313092845
Implantable Lead Connection Status: 753985
Implantable Lead Connection Status: 753985
Implantable Lead Implant Date: 20160512
Implantable Lead Implant Date: 20160512
Implantable Lead Location: 753859
Implantable Lead Location: 753860
Implantable Lead Model: 5076
Implantable Lead Model: 5092
Implantable Pulse Generator Implant Date: 20160512
Lead Channel Impedance Value: 498 Ohm
Lead Channel Impedance Value: 781 Ohm
Lead Channel Pacing Threshold Amplitude: 0.75 V
Lead Channel Pacing Threshold Amplitude: 0.875 V
Lead Channel Pacing Threshold Pulse Width: 0.4 ms
Lead Channel Pacing Threshold Pulse Width: 0.4 ms
Lead Channel Setting Pacing Amplitude: 2 V
Lead Channel Setting Pacing Amplitude: 2.5 V
Lead Channel Setting Pacing Pulse Width: 0.4 ms
Lead Channel Setting Sensing Sensitivity: 4 mV
Zone Setting Status: 755011
Zone Setting Status: 755011

## 2023-12-05 NOTE — Progress Notes (Unsigned)
 Cardiology Office Note:    Date:  12/13/2023   ID:  [name] [name], DOB 1936-03-28, MRN [id number]  PCP:  Emilio Aspen, MD  Cardiologist:  Rollene Rotunda, MD     Referring MD: Emilio Aspen, *   Chief Complaint: routine follow-up of complete heart block and hypertension   History of Present Illness:    [name] [name] is a 88 y.o. female with a history of complete heart block s/p PPM in 01/2015, mild aortic stenosis, hypertension, and anxiety who is followed by Dr. [name] and presents today for routine follow-up.   Patient is primary followed by Cardiology for complete heart block. She underwent placement of a Medtronic dual chamber PPM in 01/2015. Most recent Echo in 07/2020 showed LVEF of 50-55% with septal dyssynchrony consistent with ventricular pacing, mild LHV, and grade 1 diastolic dysfunction as well as mild aortic stenosis.   She was last seen by Dr. [name] in 08/2023 at which time she reported a little lightheadedness but was overall doing well from a cardiac standpoint. EKG was difficult to interpret but there was concern that she may of been in atrial flutter. However, device interrogation following visit showed no evidence of this. No changes were made.   Patient was seen in the ED yesterday for chest pain/ epigastric. EKG showed ventricular paced rhythm. High-sensitivity troponin was negative x2. Lipase was elevated at 57. Pain resolved and she was felt to be stable for discharge.  She presents today for follow-up. We discussed her ED visit yesterday. She states she was having pain in her epigastric/ diaphragm area. This pain radiated around both side of her upper abdomen and she also had some chest pain under her left should blade. However, she denied any chest pain. She states she has actually had this pain intermittently for years and no one has been able to tell her what it is from. Pain resolved after she took 2 Aspirin and TUMS. She has some reproducible  pain with palpation of the area below her shoulder blade where she was having pain yesterday which suggest this was more musculoskeletal. However, she denies any more epigastric/ abdominal pain since yesterday. She describes feeling a little winded with exertion but this is not new and is stable. No significant shortness of breath. No orthopnea or PND. She has some mild lower extremity edema but she attributes this to increased sodium intake due to to eating frozen dinners for 4 nights in a row. No palpitations, dizziness, syncope.    Of note, she does state her BP was markedly elevated when she was having the epigastric pain yesterday. Systolic BP was reportedly as high as the 200s. She took Xanax and Hydralazine and BP improved some. Her BP was elevated this morning at 171/103 but slowly came down on its own. BP was 113/75 at 2pm and then 138/80 in the office. She has a history of labile hypertension so is only on PRN Hydralazine.   EKGs/Labs/Other Studies Reviewed:    The following studies were reviewed:  Echocardiogram 07/08/2020: Impressions: 1. Septal dyssynchrony consistent with ventricular pacing. Left  ventricular ejection fraction, by estimation, is 50 to 55%. The left  ventricle has low normal function. The left ventricle demonstrates  regional wall motion abnormalities (see scoring  diagram/findings for description). There is mild concentric left  ventricular hypertrophy. Left ventricular diastolic parameters are  consistent with Grade I diastolic dysfunction (impaired relaxation).  Elevated left ventricular end-diastolic pressure.   2. Right ventricular systolic function is normal.  The right ventricular  size is normal. There is normal pulmonary artery systolic pressure.   3. The mitral valve is normal in structure. No evidence of mitral valve  regurgitation. No evidence of mitral stenosis.   4. The aortic valve is normal in structure. There is moderate  calcification of the aortic  valve. There is moderate thickening of the  aortic valve. Aortic valve regurgitation is not visualized. Mild aortic  valve stenosis. Aortic valve area, by VTI  measures 1.02 cm. Aortic valve mean gradient measures 10.0 mmHg. Aortic  valve Vmax measures 2.22 m/s.   5. The inferior vena cava is normal in size with greater than 50%  respiratory variability, suggesting right atrial pressure of 3 mmHg.   EKG:  EKG not ordered today.   Recent Labs: 12/12/2023: ALT 25; BUN 17; Creatinine, Ser 0.85; Hemoglobin 14.3; Platelets 224; Potassium 4.4; Sodium 137  Recent Lipid Panel    Component Value Date/Time   CHOL 136 01/16/2015 0523   TRIG 84 01/16/2015 0523   HDL 67 01/16/2015 0523   CHOLHDL 2.0 01/16/2015 0523   VLDL 17 01/16/2015 0523   LDLCALC 52 01/16/2015 0523    Physical Exam:    Vital Signs: BP 138/80 (BP Location: Left Arm, Patient Position: Sitting, Cuff Size: Normal)   Pulse 78   Ht 5' (1.524 m)   Wt 186 lb 6.4 oz (84.6 kg)   SpO2 95%   BMI 36.40 kg/m     Wt Readings from Last 3 Encounters:  12/13/23 186 lb 6.4 oz (84.6 kg)  08/18/23 182 lb (82.6 kg)  03/15/23 179 lb 6.4 oz (81.4 kg)     General: 88 y.o. Caucasian female in no acute distress. HEENT: Normocephalic and atraumatic. Sclera clear.  Neck: Supple. No carotid bruits. No JVD. Heart: RRR. Very faint I/VI systolic murmur.  Lungs: No increased work of breathing. Clear to ausculation bilaterally. No wheezes, rhonchi, or rales.  Extremities: Mild lower extremity edema bilaterally. Skin: Warm and dry. Neuro: No focal deficits. Psych: Normal affect. Responds appropriately.  Assessment:    1. Epigastric pain   2. Complete heart block (HCC)   3. Mild aortic stenosis   4. Primary hypertension     Plan:    Epigastric Pain Patient was seen in the ED yesterday for epigastric pain that radiated around both side of upper abdomen. EKG showed ventricular paced rhythm and high-sensitivity troponin was negative x2.  Lipase was elevated. - No chest pain.  - This does not sound cardiac to me. She states she has had this pain intermittently for years. Recommended following up with PCP.  Complete Heart Block s/p PPM S/p Medtronic dual chamber pacemaker in 01/2015. Last device check in 11/2023 showed normal function with 91% ventricular pacing.  - Followed by Dr. Graciela Husbands.   Mild Aortic Stenosis Noted on Echo in 07/2020.  - Per Dr. Jenene Slicker last note, plan is to manage this conservatively given her advanced age.   Hypertension She has a history of labile BP but it is typically well controlled. Her BP was markedly yesterday when she was having epigastric pain. BP was also elevated in the 170s/100s at home earlier this morning but has gradually lowered throughout the day. She does admit to eating more meals high in sodium the last 4 days so this may have contributed to higher readings.  - BP 138/80 in the office today.  - Continue Hydralazine 10mg  three times daily on as needed for systolic BP >180. - Asked patient to  keep BP log for a couple of weeks.   Disposition: Follow up in 3 months.    Signed, Corrin Parker, PA-C  12/14/2023 10:01 AM    [facility]

## 2023-12-12 ENCOUNTER — Emergency Department (HOSPITAL_COMMUNITY)

## 2023-12-12 ENCOUNTER — Other Ambulatory Visit: Payer: Self-pay

## 2023-12-12 ENCOUNTER — Encounter (HOSPITAL_COMMUNITY): Payer: Self-pay

## 2023-12-12 ENCOUNTER — Emergency Department (HOSPITAL_COMMUNITY)
Admission: EM | Admit: 2023-12-12 | Discharge: 2023-12-12 | Disposition: A | Discharge status: Home / Self Care | Attending: Emergency Medicine | Admitting: Emergency Medicine

## 2023-12-12 DIAGNOSIS — I10 Essential (primary) hypertension: Secondary | ICD-10-CM | POA: Diagnosis not present

## 2023-12-12 DIAGNOSIS — Z79899 Other long term (current) drug therapy: Secondary | ICD-10-CM | POA: Diagnosis not present

## 2023-12-12 DIAGNOSIS — M549 Dorsalgia, unspecified: Secondary | ICD-10-CM | POA: Diagnosis not present

## 2023-12-12 DIAGNOSIS — R0789 Other chest pain: Secondary | ICD-10-CM | POA: Diagnosis not present

## 2023-12-12 DIAGNOSIS — R079 Chest pain, unspecified: Secondary | ICD-10-CM | POA: Diagnosis not present

## 2023-12-12 DIAGNOSIS — Z950 Presence of cardiac pacemaker: Secondary | ICD-10-CM | POA: Insufficient documentation

## 2023-12-12 DIAGNOSIS — Z7982 Long term (current) use of aspirin: Secondary | ICD-10-CM | POA: Diagnosis not present

## 2023-12-12 DIAGNOSIS — R918 Other nonspecific abnormal finding of lung field: Secondary | ICD-10-CM | POA: Diagnosis not present

## 2023-12-12 LAB — COMPREHENSIVE METABOLIC PANEL WITH GFR
ALT: 25 U/L (ref 0–44)
AST: 37 U/L (ref 15–41)
Albumin: 3.6 g/dL (ref 3.5–5.0)
Alkaline Phosphatase: 229 U/L — ABNORMAL HIGH (ref 38–126)
Anion gap: 5 (ref 5–15)
BUN: 17 mg/dL (ref 8–23)
CO2: 27 mmol/L (ref 22–32)
Calcium: 9.5 mg/dL (ref 8.9–10.3)
Chloride: 105 mmol/L (ref 98–111)
Creatinine, Ser: 0.85 mg/dL (ref 0.44–1.00)
GFR, Estimated: >60 mL/min (ref >60)
Glucose, Bld: 87 mg/dL (ref 70–99)
Potassium: 4.4 mmol/L (ref 3.5–5.1)
Sodium: 137 mmol/L (ref 135–145)
Total Bilirubin: 0.7 mg/dL (ref 0.0–1.2)
Total Protein: 7.5 g/dL (ref 6.5–8.1)

## 2023-12-12 LAB — CBC WITH DIFFERENTIAL/PLATELET
Abs Immature Granulocytes: 0.01 10*3/uL (ref 0.00–0.07)
Basophils Absolute: 0 10*3/uL (ref 0.0–0.1)
Basophils Relative: 1 %
Eosinophils Absolute: 0.1 10*3/uL (ref 0.0–0.5)
Eosinophils Relative: 2 %
HCT: 45.9 % (ref 36.0–46.0)
Hemoglobin: 14.3 g/dL (ref 12.0–15.0)
Immature Granulocytes: 0 %
Lymphocytes Relative: 25 %
Lymphs Abs: 1.5 10*3/uL (ref 0.7–4.0)
MCH: 28.9 pg (ref 26.0–34.0)
MCHC: 31.2 g/dL (ref 30.0–36.0)
MCV: 92.7 fL (ref 80.0–100.0)
Monocytes Absolute: 0.7 10*3/uL (ref 0.1–1.0)
Monocytes Relative: 12 %
Neutro Abs: 3.5 10*3/uL (ref 1.7–7.7)
Neutrophils Relative %: 60 %
Platelets: 224 10*3/uL (ref 150–400)
RBC: 4.95 MIL/uL (ref 3.87–5.11)
RDW: 15 % (ref 11.5–15.5)
WBC: 5.8 10*3/uL (ref 4.0–10.5)
nRBC: 0 % (ref 0.0–0.2)

## 2023-12-12 LAB — LIPASE, BLOOD: Lipase: 57 U/L — ABNORMAL HIGH (ref 11–51)

## 2023-12-12 LAB — TROPONIN I (HIGH SENSITIVITY)
Troponin I (High Sensitivity): 7 ng/L (ref <18)
Troponin I (High Sensitivity): 13 ng/L (ref <18)

## 2023-12-12 NOTE — Discharge Instructions (Addendum)
 Keep a record of your blood pressure.  Take the hydralazine 10 mg every 8 hours as needed.  If your pressures are remaining high and is not a transient thing like it has been in the past then follow-up with your doctor for adjustments on the blood pressure.  Return for any new or worse symptoms.  Today's workup regarding the chest discomfort and the pain in the back without any acute findings.

## 2023-12-12 NOTE — ED Provider Notes (Addendum)
 [place] EMERGENCY DEPARTMENT AT [facility] Provider Note   CSN: [id number] Arrival date & time: 12/12/23  1753     History  Chief Complaint  Patient presents with   Hypertension    [name] [name] is a 88 y.o. female.  Patient followed by cardiology.  Patient known to have a history of heart block with pacemaker.  Patient also occasionally has hypertension she is not on hypertensive medicines on a regular basis.  She is instructed to take a Xanax if it is up some and then if that does not bring it down then she is instructed to take hydralazine.  Today at about 430 she had kind of lower chest epigastric pain that radiated to both sides lasted 5 minutes.  But then she had the pain occur in the left back area kind of right below the tip of the scapula.  That came back at around 1630 and has remained there.  EMS was called out to the house.  Also at 1630 she took one of her Xanax.  Right prior to coming in she took one of her hydralazine.  Patient states is just a dull ache in the left posterior back now.  Maybe just a hint of nausea.  The anterior upper abdomen and lower chest discomfort has completely resolved.  Patient is not on blood thinners.       Home Medications Prior to Admission medications   Medication Sig Start Date End Date Taking? Authorizing Provider  acetaminophen (TYLENOL) 500 MG tablet Take 500 mg by mouth every 6 (six) hours as needed for mild pain.    [provider]  ALPRAZolam Prudy Feeler) 0.25 MG tablet Take 0.125 tablets by mouth daily as needed for anxiety.  02/04/15   [provider]  aspirin 325 MG tablet Take 650 mg by mouth at bedtime as needed for moderate pain. Will take at bedtime if needed for pain    [provider]  aspirin 81 MG tablet Take 81 mg by mouth daily.    [provider]  Besifloxacin HCl 0.6 % SUSP Apply 1 drop to eye 4 (four) times daily. For 2 days after injection    [provider]   calcium carbonate (TUMS EX) 750 MG chewable tablet Chew 1 tablet by mouth 2 (two) times daily as needed.     [provider]  Cholecalciferol (VITAMIN D3) 2000 units TABS Take 1 tablet by mouth daily.    [provider]  famotidine (PEPCID) 20 MG tablet Take 10-20 mg by mouth daily.     [provider]  faricimab-svoa (VABYSMO) 6 MG/0.05ML SOLN intravitreal injection 6 mg by Intravitreal route every 5 (five) weeks.    [provider]  fluticasone (FLONASE) 50 MCG/ACT nasal spray Place 2 sprays into both nostrils daily as needed for allergies.  12/27/14   [provider]  hydrALAZINE (APRESOLINE) 10 MG tablet Take 10 mg three times a day only if Systolic blood pressure greater than 180 Patient taking differently: Take 10 mg three times a day only if Systolic blood pressure is 180 or greater 09/18/21   Rollene Rotunda, MD  lidocaine (LIDODERM) 5 % Place 1 patch onto the skin daily as needed. Remove & Discard patch within 12 hours or as directed by MD    [provider]  Magnesium Hydroxide (PHILLIPS MILK OF MAGNESIA PO) Take 1-2 tablets by mouth 2 (two) times daily as needed.    [provider]  Multiple Vitamins-Minerals (PRESERVISION AREDS)  CAPS 1 capsule    [provider]      Allergies    Hctz [hydrochlorothiazide], Losartan, Macrolides and ketolides, Amlodipine, Benazepril, and Penicillins    Review of Systems   Review of Systems  Constitutional:  Negative for chills and fever.  HENT:  Negative for ear pain and sore throat.   Eyes:  Negative for pain and visual disturbance.  Respiratory:  Negative for cough and shortness of breath.   Cardiovascular:  Positive for chest pain. Negative for palpitations.  Gastrointestinal:  Positive for abdominal pain. Negative for vomiting.  Genitourinary:  Negative for dysuria and hematuria.  Musculoskeletal:  Positive for back pain. Negative for arthralgias.  Skin:  Negative for  color change and rash.  Neurological:  Negative for seizures and syncope.  All other systems reviewed and are negative.   Physical Exam Updated Vital Signs BP (!) 182/85   Pulse 75   Temp 98.1 F (36.7 C) (Oral)   Resp 17   SpO2 98%  Physical Exam Vitals and nursing note reviewed.  Constitutional:      General: She is not in acute distress.    Appearance: Normal appearance. She is well-developed.  HENT:     Head: Normocephalic and atraumatic.  Eyes:     Extraocular Movements: Extraocular movements intact.     Conjunctiva/sclera: Conjunctivae normal.     Pupils: Pupils are equal, round, and reactive to light.  Cardiovascular:     Rate and Rhythm: Normal rate and regular rhythm.     Heart sounds: No murmur heard. Pulmonary:     Effort: Pulmonary effort is normal. No respiratory distress.     Breath sounds: Normal breath sounds. No wheezing or rales.  Abdominal:     Palpations: Abdomen is soft.     Tenderness: There is no abdominal tenderness.  Musculoskeletal:        General: No swelling.     Cervical back: Normal range of motion and neck supple.  Skin:    General: Skin is warm and dry.     Capillary Refill: Capillary refill takes less than 2 seconds.  Neurological:     General: No focal deficit present.     Mental Status: She is alert and oriented to person, place, and time.  Psychiatric:        Mood and Affect: Mood normal.     ED Results / Procedures / Treatments   Labs (all labs ordered are listed, but only abnormal results are displayed) Labs Reviewed  LIPASE, BLOOD - Abnormal; Notable for the following components:      Result Value   Lipase 57 (*)    All other components within normal limits  COMPREHENSIVE METABOLIC PANEL WITH GFR - Abnormal; Notable for the following components:   Alkaline Phosphatase 229 (*)    All other components within normal limits  CBC WITH DIFFERENTIAL/PLATELET  TROPONIN I (HIGH SENSITIVITY)  TROPONIN I (HIGH SENSITIVITY)     EKG EKG Interpretation Date/Time:  Monday December 12 2023 19:31:22 EDT Ventricular Rate:  83 PR Interval:    QRS Duration:  160 QT Interval:  436 QTC Calculation: 512 R Axis:   -69  Text Interpretation: Ventricular-paced rhythm Abnormal ECG When compared with ECG of 18-Aug-2023 15:13, PREVIOUS ECG IS PRESENT Confirmed by Vanetta Mulders [phone]) on 12/12/2023 7:43:27 PM  Radiology DG Chest 2 View Result Date: 12/12/2023 CLINICAL DATA:  Chest pain. EXAM: CHEST - 2 VIEW COMPARISON:  08/25/2016 FINDINGS: Left-sided pacemaker in place. The cardiomediastinal contours are  stable. Mild peribronchial thickening. Pulmonary vasculature is normal. No consolidation, pleural effusion, or pneumothorax. The bones are subjectively under mineralized. There are multiple vertebral compression fractures, grossly stable from prior, 1 of which is underwent augmentation. IMPRESSION: 1. Mild peribronchial thickening, can be seen with bronchitis or asthma. 2. Multiple vertebral compression fractures, grossly stable from prior. Electronically Signed   By: Narda Rutherford M.D.   On: 12/12/2023 21:04    Procedures Procedures    Medications Ordered in ED Medications - No data to display  ED Course/ Medical Decision Making/ A&P                                 Medical Decision Making Amount and/or Complexity of Data Reviewed Labs: ordered. Radiology: ordered.   Patient is a with some persistent left tibia scapula back pain.  Will evaluate for possible cardiac etiology we will get chest x-ray will get troponins EKG cardiac monitoring CBC lipase and complete metabolic panel.  Patient's complete metabolic panel normal other than alk phos being 229 but renal function is normal.  Bilirubin is normal.  CBC no leukocytosis hemoglobin 14.3 platelets 224.  Lipase up a little bit at 57 initial troponin was 7 and repeat was 13 so no significant change.  Chest x-ray mild peribronchial thickening consistent with  bronchitis or asthma multiple vertebral compression fractures grossly stable from prior.  So they are not acute.  Patient feeling much better.  Patient probably stable for discharge home.  Patient feeling much better.  Blood pressure still elevated.  But patient does not have a history of chronic hypertension taking hydralazine all the time.  She just does it when her pressures get high.  Will have her continue that every 8 on hydralazine 10 mg as prescribed by her doctor.  And can keep a trend and if the blood pressures are remaining high follow-up with her doctor for adjustment.   Final Clinical Impression(s) / ED Diagnoses Final diagnoses:  Chest pain, unspecified type  Primary hypertension    Rx / DC Orders ED Discharge Orders     None         Vanetta Mulders, MD 12/12/23 1831    Vanetta Mulders, MD 12/12/23 2147    Vanetta Mulders, MD 12/12/23 1610    Vanetta Mulders, MD 12/12/23 2153

## 2023-12-12 NOTE — ED Triage Notes (Signed)
 BIBA from home for left side back pain, hypertension, "pressure" in her head. Took her hydralazine at home PTA. Paced rhythm 80 HR 200/102 BP

## 2023-12-13 ENCOUNTER — Encounter: Payer: Self-pay | Admitting: Student

## 2023-12-13 ENCOUNTER — Ambulatory Visit: Payer: Medicare PPO | Attending: Student | Admitting: Student

## 2023-12-13 VITALS — BP 138/80 | HR 78 | Ht 60.0 in | Wt 186.4 lb

## 2023-12-13 DIAGNOSIS — I10 Essential (primary) hypertension: Secondary | ICD-10-CM | POA: Diagnosis not present

## 2023-12-13 DIAGNOSIS — R1013 Epigastric pain: Secondary | ICD-10-CM | POA: Diagnosis not present

## 2023-12-13 DIAGNOSIS — I350 Nonrheumatic aortic (valve) stenosis: Secondary | ICD-10-CM | POA: Diagnosis not present

## 2023-12-13 DIAGNOSIS — R079 Chest pain, unspecified: Secondary | ICD-10-CM

## 2023-12-13 DIAGNOSIS — I442 Atrioventricular block, complete: Secondary | ICD-10-CM | POA: Diagnosis not present

## 2023-12-13 NOTE — Patient Instructions (Signed)
 Medication Instructions:  NO CHANGES *If you need a refill on your cardiac medications before your next appointment, please call your pharmacy*  Lab Work: NO LABS If you have labs (blood work) drawn today and your tests are completely normal, you will receive your results only by: MyChart Message (if you have MyChart) OR A paper copy in the mail If you have any lab test that is abnormal or we need to change your treatment, we will call you to review the results.  Testing/Procedures: NO TESTING  Follow-Up: At [facility], you and your health needs are our priority.  As part of our continuing mission to provide you with exceptional heart care, our providers are all part of one team.  This team includes your primary Cardiologist (physician) and Advanced Practice Providers or APPs (Physician Assistants and Nurse Practitioners) who all work together to provide you with the care you need, when you need it.  Your next appointment:   3 month(s)  Provider:   Rollene Rotunda, MD   We recommend signing up for the patient portal called "MyChart".  Sign up information is provided on this After Visit Summary.  MyChart is used to connect with patients for Virtual Visits (Telemedicine).  Patients are able to view lab/test results, encounter notes, upcoming appointments, etc.  Non-urgent messages can be sent to your provider as well.   To learn more about what you can do with MyChart, go to [email or web address].   Other Instructions   1st Floor: - Lobby - Registration  - Pharmacy  - Lab - Cafe  2nd Floor: - PV Lab - Diagnostic Testing (echo, CT, nuclear med)  3rd Floor: - Vacant  4th Floor: - TCTS (cardiothoracic surgery) - AFib Clinic - Structural Heart Clinic - Vascular Surgery  - Vascular Ultrasound  5th Floor: - [facility] Card[facility] (general and EP) - Clinical Pharmacy for coumadin, hypertension, lipid, weight-loss medications, and med management  appointments    Valet parking services will be available as well.

## 2023-12-14 ENCOUNTER — Encounter: Payer: Self-pay | Admitting: Student

## 2023-12-14 ENCOUNTER — Encounter (INDEPENDENT_AMBULATORY_CARE_PROVIDER_SITE_OTHER): Admitting: Ophthalmology

## 2023-12-14 DIAGNOSIS — H353231 Exudative age-related macular degeneration, bilateral, with active choroidal neovascularization: Secondary | ICD-10-CM | POA: Diagnosis not present

## 2023-12-14 DIAGNOSIS — H35033 Hypertensive retinopathy, bilateral: Secondary | ICD-10-CM

## 2023-12-14 DIAGNOSIS — H43813 Vitreous degeneration, bilateral: Secondary | ICD-10-CM | POA: Diagnosis not present

## 2023-12-14 DIAGNOSIS — I10 Essential (primary) hypertension: Secondary | ICD-10-CM

## 2023-12-14 DIAGNOSIS — D3131 Benign neoplasm of right choroid: Secondary | ICD-10-CM

## 2023-12-16 ENCOUNTER — Encounter: Payer: Self-pay | Admitting: Podiatry

## 2023-12-16 ENCOUNTER — Telehealth: Payer: Self-pay | Admitting: Cardiology

## 2023-12-16 ENCOUNTER — Ambulatory Visit: Admitting: Podiatry

## 2023-12-16 DIAGNOSIS — B351 Tinea unguium: Secondary | ICD-10-CM | POA: Diagnosis not present

## 2023-12-16 DIAGNOSIS — L84 Corns and callosities: Secondary | ICD-10-CM | POA: Diagnosis not present

## 2023-12-16 DIAGNOSIS — M205X1 Other deformities of toe(s) (acquired), right foot: Secondary | ICD-10-CM | POA: Diagnosis not present

## 2023-12-16 DIAGNOSIS — M79674 Pain in right toe(s): Secondary | ICD-10-CM

## 2023-12-16 DIAGNOSIS — M79675 Pain in left toe(s): Secondary | ICD-10-CM

## 2023-12-16 NOTE — Telephone Encounter (Signed)
 Called pt to inform her that she needed to contact her PCP for a refill on her medication alprazolam. I advised the pt that if she has any other cardiac problems, questions or concerns, to give our office a call back. Pt verbalized understanding.

## 2023-12-16 NOTE — Telephone Encounter (Signed)
*  STAT* If patient is at the pharmacy, call can be transferred to refill team.   1. Which medications need to be refilled? (please list name of each medication and dose if known)   ALPRAZolam (XANAX) 0.25 MG tablet    2. Which pharmacy/location (including street and city if local pharmacy) is medication to be sent to?  [facility] [place], [place] - [address]      3. Do they need a 30 day or 90 day supply? 90 day

## 2023-12-18 ENCOUNTER — Other Ambulatory Visit: Payer: Self-pay | Admitting: Cardiology

## 2023-12-19 ENCOUNTER — Telehealth: Payer: Self-pay | Admitting: Cardiology

## 2023-12-19 MED ORDER — HYDRALAZINE HCL 10 MG PO TABS: ORAL_TABLET | ORAL | 6 refills | Status: DC

## 2023-12-19 NOTE — Telephone Encounter (Signed)
*  STAT* If patient is at the pharmacy, call can be transferred to refill team.   1. Which medications need to be refilled? (please list name of each medication and dose if known) hydrALAZINE (APRESOLINE) 10 MG tablet   2. Which pharmacy/location (including street and city if local pharmacy) is medication to be sent to? [facility] [place], [place] - [address]   3. Do they need a 30 day or 90 day supply? 90

## 2023-12-19 NOTE — Progress Notes (Signed)
 Subjective:   Patient ID: [name] [name], female   DOB: 88 y.o.   MRN: [id number]   HPI Chief Complaint  Patient presents with   RFC    RM#13 RFC/Corn on pinky toe right foot   88 year old female presents the office with above concerns.  She is a send nails are thickened elongated she cannot do them herself now causing discomfort.  She also has pain to her right fifth toe, lateral aspect which causes pain.  She did tried over-the-counter corn remover.  No open lesions or any drainage.  No other concerns.  No injuries.   Review of Systems  All other systems reviewed and are negative.   Past Medical History:  Diagnosis Date   Anxiety    Broken arm Right   Broken leg    Complete heart block (HCC)    Hypertension    Postural dizziness     Past Surgical History:  Procedure Laterality Date   EP IMPLANTABLE DEVICE N/A 01/16/2015   MDT Adapta L PPM implanted by Dr Johney Frame for complete heart block   IR GENERIC HISTORICAL  09/15/2016   IR RADIOLOGIST EVAL & MGMT 09/15/2016 [facility]   IR GENERIC HISTORICAL  09/28/2016   IR KYPHO THORACIC WITH BONE BIOPSY 09/28/2016 Julieanne Cotton, MD [facility]   IR GENERIC HISTORICAL  10/14/2016   IR RADIOLOGIST EVAL & MGMT 10/14/2016 [facility]   PACEMAKER INSERTION N/A 01/2015     Current Outpatient Medications:    acetaminophen (TYLENOL) 500 MG tablet, Take 500 mg by mouth every 6 (six) hours as needed for mild pain., Disp: , Rfl:    ALPRAZolam (XANAX) 0.25 MG tablet, Take 0.125 tablets by mouth daily as needed for anxiety. , Disp: , Rfl:    aspirin 325 MG tablet, Take 650 mg by mouth at bedtime as needed for moderate pain. Will take at bedtime if needed for pain, Disp: , Rfl:    aspirin 81 MG tablet, Take 81 mg by mouth daily., Disp: , Rfl:    Besifloxacin HCl 0.6 % SUSP, Apply 1 drop to eye 4 (four) times daily. For 2 days after injection, Disp: , Rfl:    calcium carbonate (TUMS EX) 750 MG chewable tablet, Chew 1 tablet by mouth 2 (two)  times daily as needed. , Disp: , Rfl:    Cholecalciferol (VITAMIN D3) 2000 units TABS, Take 1 tablet by mouth daily., Disp: , Rfl:    famotidine (PEPCID) 20 MG tablet, Take 10-20 mg by mouth daily. , Disp: , Rfl:    faricimab-svoa (VABYSMO) 6 MG/0.05ML SOLN intravitreal injection, 6 mg by Intravitreal route every 5 (five) weeks., Disp: , Rfl:    fluticasone (FLONASE) 50 MCG/ACT nasal spray, Place 2 sprays into both nostrils daily as needed for allergies. , Disp: , Rfl:    hydrALAZINE (APRESOLINE) 10 MG tablet, Take 10 mg three times a day only if Systolic blood pressure greater than 180 (Patient taking differently: Take 10 mg three times a day only if Systolic blood pressure is 180 or greater), Disp: 90 tablet, Rfl: 6   lidocaine (LIDODERM) 5 %, Place 1 patch onto the skin daily as needed. Remove & Discard patch within 12 hours or as directed by MD, Disp: , Rfl:    Magnesium Hydroxide (PHILLIPS MILK OF MAGNESIA PO), Take 1-2 tablets by mouth 2 (two) times daily as needed., Disp: , Rfl:    Multiple Vitamins-Minerals (PRESERVISION AREDS) CAPS, 1 capsule, Disp: , Rfl:   Allergies  Allergen Reactions  Hctz [Hydrochlorothiazide] Shortness Of Breath and Other (See Comments)    Very short winded, made her hurt all over    Losartan Other (See Comments)    Skin crawling feelings   Macrolides And Ketolides Other (See Comments)    Pyloric sphincter flareups-unsure of which -mycins   Amlodipine Other (See Comments)   Benazepril Other (See Comments)    Doesn't work for patient   Penicillins Other (See Comments)    abd pain Has patient had a PCN reaction causing immediate rash, facial/tongue/throat swelling, SOB or lightheadedness with hypotension: No Has patient had a PCN reaction causing severe rash involving mucus membranes or skin necrosis: No Has patient had a PCN reaction that required hospitalization No Has patient had a PCN reaction occurring within the last 10 years: No If all of the above  answers are "NO", then may proceed with Cephalosporin use.           Objective:  Physical Exam  General: AAO x3, NAD  Dermatological: Nails are hypertrophic, dystrophic, brittle, discolored, elongated 10. No surrounding redness or drainage. Tenderness nails 1-5 bilaterally.  Hyperkeratotic lesion noted along the medial aspect the right fifth toe.  There is no underlying ulceration, drainage or any signs of infection.  Vascular: Dorsalis Pedis artery and Posterior Tibial artery pedal pulses are 2/4 bilateral with immedate capillary fill time. There is no pain with calf compression, swelling, warmth, erythema.   Neruologic: Grossly intact via light touch bilateral.   Musculoskeletal: Digital contractures present at the results of the hyperkeratotic lesion on the right fifth toe.  Gait: Unassisted, Nonantalgic.       Assessment:   88 year old female with symptomatic onychomycosis; hyperkeratotic lesion as result of digital deformity     Plan:  -Treatment options discussed including all alternatives, risks, and complications -Etiology of symptoms were discussed - Sharply debrided nails x 10 without any complications or bleeding -As a courtesy debrided the callus on the right fifth toe without complications or bleeding.  We dispensed dispensed offloading pads.  Discussed shoe modifications avoid excess pressure.  Discussed chance of reoccurrence.    Charity Conch DPM

## 2023-12-28 ENCOUNTER — Telehealth: Payer: Self-pay | Admitting: Student

## 2023-12-28 NOTE — Telephone Encounter (Signed)
 Pt called in to give BP readings over two weeks. She is concerned that her BP is still high in the mornings, please advise.   4/9: 162/99  - AM         123/90 - PM   4/10: 152/100 - AM           117/73 hr 72  - PM   4/11: 166/108 - AM           118/80 hr 76 (after Xanax )   - PM   4/12: 146/108 hr 77  - AM           151/97   - PM            113/84 hr 77 (after Xanax )  - PM   4/13: 188/104   - AM           179/107   - PM           141/95 hr 76  - PM   4/14: 168/108  - AM           111/79 hr84 (afterXanax)   - PM   4/15: 151/85 hr 67  - AM           119/78   - PM   4/16: 170/111 hr 68  - AM           151/87 hr 76  - PM           177/100   - PM          141/88   - PM   4/17: 158/101   - AM            151/91  - PM           141/71 hr 73  - PM   4/18: 173/102 hr72  - AM           150/91 hr 68  - PM             149/95 hr78   - PM   4/19: 171/96 hr74   - AM             156/96  - PM           106/67 hr 78 (After xanax )  - PM   4/20: did not take   4/21: 166/88 hr 76   - AM           131/83 hr 73   - PM   4/22: 170/103 hr 74  - AM            156/96    - PM                  167/97 hr74  - PM            93/63 hr 78   - PM   4/23: 170/101 hr 72  - AM           113/65 hr77   - PM

## 2024-01-02 NOTE — Progress Notes (Signed)
 Remote pacemaker transmission.

## 2024-01-02 NOTE — Addendum Note (Signed)
 Addended by: Lott Rouleau A on: 01/02/2024 02:53 PM   Modules accepted: Orders

## 2024-01-04 DIAGNOSIS — H04123 Dry eye syndrome of bilateral lacrimal glands: Secondary | ICD-10-CM | POA: Diagnosis not present

## 2024-01-04 DIAGNOSIS — H0100A Unspecified blepharitis right eye, upper and lower eyelids: Secondary | ICD-10-CM | POA: Diagnosis not present

## 2024-01-04 DIAGNOSIS — D3131 Benign neoplasm of right choroid: Secondary | ICD-10-CM | POA: Diagnosis not present

## 2024-01-04 DIAGNOSIS — H353231 Exudative age-related macular degeneration, bilateral, with active choroidal neovascularization: Secondary | ICD-10-CM | POA: Diagnosis not present

## 2024-01-04 NOTE — Telephone Encounter (Signed)
 [name] Grates, MD  You13 hours ago (8:24 PM)    She has had labile BPs.  I would like to try to add hydralazine  10 mg bid scheduled and she can continue to take an extra 10 mg as needed for SBP greater than about 160 up to 10 mg tid.   Patient identification verified by 2 forms. Hilton Lucky, RN    Called and spoke to patient  Relayed provider message  Patient states:   -BP has actually improved recently   -does not mind adding medication but would like Dr. Lavonne Prairie to review new Log   -she does not take any scheduled BP medications right now  Recent BP Log:  -4/24 at 10:00 am  BP: 147/86 HR: 76  -4/24 at 7:30 pm BP: 113/73 HR: 73  -4/25 at 8:30 am BP: 137/83 HR: 75 -4/25 Evening BP: 134/82 HR: 76 -4/26 at 8:30 am BP: 109/70 HR: 76  -4/26 Evening BP: 128/83 HR: 75  -4/27 at 9:00 am BP: 115/72 HR: 74  -4/27 at 7:30 am BP:  99/67 HR: 79 -4/28 at 8:20 am BP: 129/77 HR: 71  -4/30 at 8:40 am BP: 129/80 HR: 74    Informed patient message sent to Dr. Lavonne Prairie for input/advisement  Patient has no further questions at this time

## 2024-01-05 MED ORDER — HYDRALAZINE HCL 10 MG PO TABS: 10.0000 mg | ORAL_TABLET | Freq: Two times a day (BID) | ORAL | 3 refills | Status: DC

## 2024-01-05 NOTE — Telephone Encounter (Signed)
 Patient identification verified by 2 forms. Hilton Lucky, RN    Called and spoke to patient  Advised patient:  -to start Hydralazine  10 mg BID per Dr. Lavonne Prairie -keep a BP log for the next 2 weeks, call to report  -check BP 1-2 hours after Rx in morning and evening and check ~3pm  -symptoms of low BP or low BP readings outreach office  Reviewed ED warning signs/precautions  Patient verbalized understanding, no questions at this time

## 2024-01-06 ENCOUNTER — Telehealth: Payer: Self-pay | Admitting: Cardiology

## 2024-01-06 NOTE — Telephone Encounter (Signed)
 Spoke to patient she stated she started Hydralazine  10 mg twice a day yesterday.Stated she is concerned 1 hr after morning meds her B/P 94/66 pulse 70.B/P at present 173/98 pulse 74.Advised she should continue Hydralazine  10 mg twice a day.Continue to monitor B/P.Appointment scheduled with Dr.Hochrein 5/29 at 3:20 pm.Bring B/P readings to appointment.I will send message to Dr.Hochrein for advice.

## 2024-01-06 NOTE — Telephone Encounter (Signed)
 Pt c/o BP issue: STAT if pt c/o blurred vision, one-sided weakness or slurred speech.  1. What is your BP concern?  BP dropped significantly after taking BP medication.  2. Have you taken any BP medication today? Yes   3. What are your last 5 BP readings? 5/01: 114/74 70          134/82 69          129/75 77  5/02: 134/84 73 (before medication)            94/66 70 after medication  4. Are you having any other symptoms (ex. Dizziness, headache, blurred vision, passed out)?  Fatigue/weakness

## 2024-01-18 ENCOUNTER — Encounter (INDEPENDENT_AMBULATORY_CARE_PROVIDER_SITE_OTHER): Admitting: Ophthalmology

## 2024-01-18 DIAGNOSIS — H43813 Vitreous degeneration, bilateral: Secondary | ICD-10-CM | POA: Diagnosis not present

## 2024-01-18 DIAGNOSIS — H35033 Hypertensive retinopathy, bilateral: Secondary | ICD-10-CM | POA: Diagnosis not present

## 2024-01-18 DIAGNOSIS — D3131 Benign neoplasm of right choroid: Secondary | ICD-10-CM

## 2024-01-18 DIAGNOSIS — I10 Essential (primary) hypertension: Secondary | ICD-10-CM

## 2024-01-18 DIAGNOSIS — H353231 Exudative age-related macular degeneration, bilateral, with active choroidal neovascularization: Secondary | ICD-10-CM | POA: Diagnosis not present

## 2024-01-23 ENCOUNTER — Telehealth: Payer: Self-pay | Admitting: Cardiology

## 2024-01-23 NOTE — Telephone Encounter (Signed)
 Pt c/o BP issue: STAT if pt c/o blurred vision, one-sided weakness or slurred speech.   1. What is your BP concern?  Patient is calling to report BP readings.  2. Have you taken any BP medication today? Yes   3. What are your last 5 BP readings? 5/02: 8:00am 134/84 73          9:20am  1 hour after meds 94/66 70          2:45pm 173/98 74           3:45pm 163/92 70         8:00pm 165/102          9:00pm  119/74 74  5/03: 8:50am 108/75          10:50 am 123/76 70          3:00 pm 134/80 72          10:20 pm 141/88           1 hr 139/87 70  5:04: 9:50am: 120/78 77         10:59 after meds 97/70 77         3:00pm 149/92 74         10:15pm:  132/86 67         11:50pm: 160/99 71  5/05: 125/83 78          103/69 74 1 hour later           3:00pm: 140/86 75          Around 7:00pm 141/86 75          1-2 hrs later 123/78 69  5/06: 9:30am: 134/91 76          1 hr later 132/85 80           Mid day 175/97           Dinner 145/86 70          1 hr later 167/95  5/07: 132/80           1 hr 127/80 74          Mid day 152/89 73          Around 10:00 PM 130/83 73          1 hr later: 80/59 69  5/08: 139/81 75          10:30am: 126/78 72          5:30pm: 157/86 73          11:00am: 135/77 72          1 hr later: 93/62 67  5/09: 131/83 79           1 hr later118/77 72         Midday 130/76 75         121/77 71         1 hr later 105/69 70  5/10: 135/84 73           Evening 132/84 70            110 149/90 69  5/11: 12:00pm: 107/69 68 126/84 77 4:00pm 169/102 71 5:30pm 125/93 73  5/12: 113/71 77 first thing in the morning          1 hr later 136/78 74           Midday 154/93 71          133/79 73  137/82 73  5/13: 138/86 76          148/83 72          6:00pm: 163/96 72          6:10pm: 152/89 73          107/70 69          151/83 68  5/14: 129/81 81 122/77 76 330: 182/?? 630 140/83 64  5/15: 176/101 71          156/91 70           11:50am:  190/106 67          3:00pm 131/82 67          144/82 72          126/75 70  5/16: 131/84 75          105/67 71          3:00am: 110/72 75          9:00 85/52 75  5/17: 149/89 76          10:40am: 91/60 78          147/78 72          151/87 71   5/18: 9:30am 151/92 76          101/70 78          144/94 78           160/95 74  5/19: 179/105 76           119/71 74          3:30pm: 173/98    4. Are you having any other symptoms (ex. Dizziness, headache, blurred vision, passed out)?  No

## 2024-01-24 NOTE — Telephone Encounter (Signed)
 Spoke with pt. Let pt know Dr Aaron Aas recommendations and advised of appointment next week. Pt stated understanding.

## 2024-02-01 DIAGNOSIS — Z950 Presence of cardiac pacemaker: Secondary | ICD-10-CM | POA: Diagnosis not present

## 2024-02-01 DIAGNOSIS — Z0000 Encounter for general adult medical examination without abnormal findings: Secondary | ICD-10-CM | POA: Diagnosis not present

## 2024-02-01 DIAGNOSIS — R54 Age-related physical debility: Secondary | ICD-10-CM | POA: Diagnosis not present

## 2024-02-01 DIAGNOSIS — Z1331 Encounter for screening for depression: Secondary | ICD-10-CM | POA: Diagnosis not present

## 2024-02-01 DIAGNOSIS — I495 Sick sinus syndrome: Secondary | ICD-10-CM | POA: Diagnosis not present

## 2024-02-01 DIAGNOSIS — I10 Essential (primary) hypertension: Secondary | ICD-10-CM | POA: Diagnosis not present

## 2024-02-01 DIAGNOSIS — I872 Venous insufficiency (chronic) (peripheral): Secondary | ICD-10-CM | POA: Diagnosis not present

## 2024-02-01 DIAGNOSIS — R5382 Chronic fatigue, unspecified: Secondary | ICD-10-CM | POA: Diagnosis not present

## 2024-02-01 DIAGNOSIS — Z79899 Other long term (current) drug therapy: Secondary | ICD-10-CM | POA: Diagnosis not present

## 2024-02-01 DIAGNOSIS — R748 Abnormal levels of other serum enzymes: Secondary | ICD-10-CM | POA: Diagnosis not present

## 2024-02-01 DIAGNOSIS — E559 Vitamin D deficiency, unspecified: Secondary | ICD-10-CM | POA: Diagnosis not present

## 2024-02-01 NOTE — Progress Notes (Unsigned)
  Cardiology Office Note:   Date:  02/02/2024  ID:  [name] [name], DOB 21-Dec-1935, MRN [id number] PCP: Benedetta Bradley, MD  [facility] Providers Cardiologist:  Eilleen Grates, MD {  History of Present Illness:   [name] [name] is a 88 y.o. female who presents for evaluation of difficult to control.  She had heart block and did have a pacemaker placed.     Since I last saw her she was seen in the ED in April for evaluation of chest pain.   We did see her after this and and this was thought to be non anginal chest pain.  She says that she was not having chest pain.   She says that her blood pressure was elevated.  She denies any ongoing chest pressure, neck or arm discomfort.  She has no new shortness of breath, PND or orthopnea.  She has no new palpitations, presyncope or syncope.  She has had no weight gain or edema.  Her blood pressure however has been very labile.  After her last visit with us  we did tell her over the phone to change her meds to hydralazine  10 mg twice daily holding it if her blood pressure is low.  She has been checking her blood pressure and only had a couple of doses because typically the systolics in the 140s and sometimes 170s.  She has had a few low systolic blood pressures.  She does not feel well when it goes up.  At some she might get the chest discomfort.  She gets anxious.  She will take a Xanax .  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  ROS: As stated in the HPI and negative for all other systems.  Studies Reviewed:    EKG:   NA  Risk Assessment/Calculations:              Physical Exam:   VS:  BP 124/60   Pulse 79   Ht 5\' 4"  (1.626 m)   Wt 185 lb (83.9 kg)   SpO2 94%   BMI 31.76 kg/m    Wt Readings from Last 3 Encounters:  02/02/24 185 lb (83.9 kg)  12/13/23 186 lb 6.4 oz (84.6 kg)  08/18/23 182 lb (82.6 kg)     GEN: Well nourished, well developed in no acute distress NECK: No JVD; No  carotid bruits CARDIAC: RRR, 3 out of 6 apical systolic murmur radiating slightly at the aortic outflow tract, no diastolic murmurs, rubs, gallops RESPIRATORY:  Clear to auscultation without rales, wheezing or rhonchi  ABDOMEN: Soft, non-tender, non-distended EXTREMITIES:  No edema; No deformity   ASSESSMENT AND PLAN:   Complete heart block/Status post pacemaker: She is up-to-date with follow-up and has follow-up scheduled later this summer as well.    HTN: We talked about her blood pressure at great length.  She is going to take her blood pressure readings twice a day.  She will hold hydralazine  if her systolic is less than 100.  She will take an extra hydralazine  if her systolic is greater than 170.  She managed her anxiety per her primary care doctor with Xanax .  AS: This was mild.  I am going to manage this conservatively given her somewhat frail condition and advanced age.    Follow up with Callie E Goodrich, PA in about six months.   Signed, Eilleen Grates, MD

## 2024-02-02 ENCOUNTER — Encounter: Payer: Self-pay | Admitting: Cardiology

## 2024-02-02 ENCOUNTER — Ambulatory Visit: Attending: Cardiology | Admitting: Cardiology

## 2024-02-02 VITALS — BP 124/60 | HR 79 | Ht 64.0 in | Wt 185.0 lb

## 2024-02-02 DIAGNOSIS — R5382 Chronic fatigue, unspecified: Secondary | ICD-10-CM | POA: Diagnosis not present

## 2024-02-02 DIAGNOSIS — E559 Vitamin D deficiency, unspecified: Secondary | ICD-10-CM | POA: Diagnosis not present

## 2024-02-02 DIAGNOSIS — R072 Precordial pain: Secondary | ICD-10-CM

## 2024-02-02 DIAGNOSIS — I350 Nonrheumatic aortic (valve) stenosis: Secondary | ICD-10-CM | POA: Diagnosis not present

## 2024-02-02 DIAGNOSIS — I10 Essential (primary) hypertension: Secondary | ICD-10-CM | POA: Diagnosis not present

## 2024-02-02 DIAGNOSIS — R748 Abnormal levels of other serum enzymes: Secondary | ICD-10-CM | POA: Diagnosis not present

## 2024-02-02 DIAGNOSIS — Z79899 Other long term (current) drug therapy: Secondary | ICD-10-CM | POA: Diagnosis not present

## 2024-02-02 MED ORDER — HYDRALAZINE HCL 10 MG PO TABS: 10.0000 mg | ORAL_TABLET | Freq: Two times a day (BID) | ORAL | 3 refills | Status: AC

## 2024-02-02 NOTE — Telephone Encounter (Signed)
 Error

## 2024-02-02 NOTE — Patient Instructions (Signed)
 Medication Instructions:   Change dosage of Hydralazine  -- take 10 mg twice a day , if blood pressure is greater than 160 systolic you may take an additional  10 mg , if blood pressure is less than 100  systolic then do not take Hydralazine  at that timeframe wait a check at next schedule dose  *If you need a refill on your cardiac medications before your next appointment, please call your pharmacy*   Lab Work: Not needed    Testing/Procedures:  Not needed  Follow-Up: At [facility], you and your health needs are our priority.  As part of our continuing mission to provide you with exceptional heart care, we have created designated Provider Care Teams.  These Care Teams include your primary Cardiologist (physician) and Advanced Practice Providers (APPs -  Physician Assistants and Nurse Practitioners) who all work together to provide you with the care you need, when you need it.     Your next appointment:   6 month(s)  The format for your next appointment:   In Person  Provider:   Callie Goodrich, PA-C

## 2024-02-06 DIAGNOSIS — M25562 Pain in left knee: Secondary | ICD-10-CM | POA: Diagnosis not present

## 2024-02-06 DIAGNOSIS — E6609 Other obesity due to excess calories: Secondary | ICD-10-CM | POA: Diagnosis not present

## 2024-02-06 DIAGNOSIS — I495 Sick sinus syndrome: Secondary | ICD-10-CM | POA: Diagnosis not present

## 2024-02-06 DIAGNOSIS — F419 Anxiety disorder, unspecified: Secondary | ICD-10-CM | POA: Diagnosis not present

## 2024-02-06 DIAGNOSIS — E559 Vitamin D deficiency, unspecified: Secondary | ICD-10-CM | POA: Diagnosis not present

## 2024-02-06 DIAGNOSIS — I872 Venous insufficiency (chronic) (peripheral): Secondary | ICD-10-CM | POA: Diagnosis not present

## 2024-02-06 DIAGNOSIS — R3915 Urgency of urination: Secondary | ICD-10-CM | POA: Diagnosis not present

## 2024-02-06 DIAGNOSIS — I10 Essential (primary) hypertension: Secondary | ICD-10-CM | POA: Diagnosis not present

## 2024-02-06 DIAGNOSIS — H3530 Unspecified macular degeneration: Secondary | ICD-10-CM | POA: Diagnosis not present

## 2024-02-10 DIAGNOSIS — F419 Anxiety disorder, unspecified: Secondary | ICD-10-CM | POA: Diagnosis not present

## 2024-02-10 DIAGNOSIS — R3915 Urgency of urination: Secondary | ICD-10-CM | POA: Diagnosis not present

## 2024-02-10 DIAGNOSIS — M25562 Pain in left knee: Secondary | ICD-10-CM | POA: Diagnosis not present

## 2024-02-10 DIAGNOSIS — E6609 Other obesity due to excess calories: Secondary | ICD-10-CM | POA: Diagnosis not present

## 2024-02-10 DIAGNOSIS — E559 Vitamin D deficiency, unspecified: Secondary | ICD-10-CM | POA: Diagnosis not present

## 2024-02-10 DIAGNOSIS — I10 Essential (primary) hypertension: Secondary | ICD-10-CM | POA: Diagnosis not present

## 2024-02-10 DIAGNOSIS — I495 Sick sinus syndrome: Secondary | ICD-10-CM | POA: Diagnosis not present

## 2024-02-10 DIAGNOSIS — H3530 Unspecified macular degeneration: Secondary | ICD-10-CM | POA: Diagnosis not present

## 2024-02-10 DIAGNOSIS — I872 Venous insufficiency (chronic) (peripheral): Secondary | ICD-10-CM | POA: Diagnosis not present

## 2024-02-13 DIAGNOSIS — F419 Anxiety disorder, unspecified: Secondary | ICD-10-CM | POA: Diagnosis not present

## 2024-02-13 DIAGNOSIS — H3530 Unspecified macular degeneration: Secondary | ICD-10-CM | POA: Diagnosis not present

## 2024-02-13 DIAGNOSIS — E559 Vitamin D deficiency, unspecified: Secondary | ICD-10-CM | POA: Diagnosis not present

## 2024-02-13 DIAGNOSIS — I10 Essential (primary) hypertension: Secondary | ICD-10-CM | POA: Diagnosis not present

## 2024-02-13 DIAGNOSIS — E6609 Other obesity due to excess calories: Secondary | ICD-10-CM | POA: Diagnosis not present

## 2024-02-13 DIAGNOSIS — M25562 Pain in left knee: Secondary | ICD-10-CM | POA: Diagnosis not present

## 2024-02-13 DIAGNOSIS — R3915 Urgency of urination: Secondary | ICD-10-CM | POA: Diagnosis not present

## 2024-02-13 DIAGNOSIS — I495 Sick sinus syndrome: Secondary | ICD-10-CM | POA: Diagnosis not present

## 2024-02-13 DIAGNOSIS — I872 Venous insufficiency (chronic) (peripheral): Secondary | ICD-10-CM | POA: Diagnosis not present

## 2024-02-14 ENCOUNTER — Ambulatory Visit (INDEPENDENT_AMBULATORY_CARE_PROVIDER_SITE_OTHER): Payer: Medicare PPO

## 2024-02-14 DIAGNOSIS — I442 Atrioventricular block, complete: Secondary | ICD-10-CM

## 2024-02-15 ENCOUNTER — Telehealth: Payer: Self-pay | Admitting: Cardiology

## 2024-02-15 DIAGNOSIS — I495 Sick sinus syndrome: Secondary | ICD-10-CM | POA: Diagnosis not present

## 2024-02-15 DIAGNOSIS — R3915 Urgency of urination: Secondary | ICD-10-CM | POA: Diagnosis not present

## 2024-02-15 DIAGNOSIS — H3530 Unspecified macular degeneration: Secondary | ICD-10-CM | POA: Diagnosis not present

## 2024-02-15 DIAGNOSIS — I872 Venous insufficiency (chronic) (peripheral): Secondary | ICD-10-CM | POA: Diagnosis not present

## 2024-02-15 DIAGNOSIS — F419 Anxiety disorder, unspecified: Secondary | ICD-10-CM | POA: Diagnosis not present

## 2024-02-15 DIAGNOSIS — I10 Essential (primary) hypertension: Secondary | ICD-10-CM | POA: Diagnosis not present

## 2024-02-15 DIAGNOSIS — E6609 Other obesity due to excess calories: Secondary | ICD-10-CM | POA: Diagnosis not present

## 2024-02-15 DIAGNOSIS — E559 Vitamin D deficiency, unspecified: Secondary | ICD-10-CM | POA: Diagnosis not present

## 2024-02-15 DIAGNOSIS — M25562 Pain in left knee: Secondary | ICD-10-CM | POA: Diagnosis not present

## 2024-02-15 LAB — CUP PACEART REMOTE DEVICE CHECK
Battery Impedance: 1717 Ohm
Battery Remaining Longevity: 42 mo
Battery Voltage: 2.75 V
Brady Statistic AP VP Percent: 9 %
Brady Statistic AP VS Percent: 0 %
Brady Statistic AS VP Percent: 91 %
Brady Statistic AS VS Percent: 0 %
Date Time Interrogation Session: 20250610233811
Implantable Lead Connection Status: 753985
Implantable Lead Connection Status: 753985
Implantable Lead Implant Date: 20160512
Implantable Lead Implant Date: 20160512
Implantable Lead Location: 753859
Implantable Lead Location: 753860
Implantable Lead Model: 5076
Implantable Lead Model: 5092
Implantable Pulse Generator Implant Date: 20160512
Lead Channel Impedance Value: 472 Ohm
Lead Channel Impedance Value: 762 Ohm
Lead Channel Pacing Threshold Amplitude: 0.75 V
Lead Channel Pacing Threshold Amplitude: 0.75 V
Lead Channel Pacing Threshold Pulse Width: 0.4 ms
Lead Channel Pacing Threshold Pulse Width: 0.4 ms
Lead Channel Setting Pacing Amplitude: 2 V
Lead Channel Setting Pacing Amplitude: 2.5 V
Lead Channel Setting Pacing Pulse Width: 0.4 ms
Lead Channel Setting Sensing Sensitivity: 2.8 mV
Zone Setting Status: 755011
Zone Setting Status: 755011

## 2024-02-15 NOTE — Telephone Encounter (Signed)
 Pt c/o BP issue: STAT if pt c/o blurred vision, one-sided weakness or slurred speech.  STAT if BP is GREATER than 180/120 TODAY.  STAT if BP is LESS than 90/60 and SYMPTOMATIC TODAY  1. What is your BP concern?   Patient is concerned her BP is fluctuating and she is unsure about her hydrALAZINE  (APRESOLINE ) 10 MG tablet   2. Have you taken any BP medication today?  Yes  3. What are your last 5 BP readings?  80/60  HR 77  this morning 109/75  HR 72 169/87  1:15 pm (patient stated she took some hydralazine ) 174/89 around 1:50 pm with her PT provider 165/92   Sunday  4. Are you having any other symptoms (ex. Dizziness, headache, blurred vision, passed out)?   Patient stated she had felt as though she was going to pass out.  Patient stated she was told to not take this medication if her BP drops below 100.  Patient is concerned her BP readings have been fluctuating.

## 2024-02-15 NOTE — Telephone Encounter (Signed)
 Spoke with who is reporting having problems with her blood pressure today. This morning it was 80/60 HR 70s - felt fine but did not take her hydralazine  d/t BP being low. 10 am 109/75 1 pm while talking on the phone she reports feeling like is was going to pass out and BP was 169/87.  She admits to being very anxious when this occurs and understands, it causes her BP to because higher. She did take her hydralazine  then. 1:50 pm 174/89 - felt bad - rested 2:20 pm 156/90   Yesterday she felt OK until bed time then BP became elevated. Sunday she reports having to pressure feeling in her diaphragm that she has spoke with Dr Lavonne Prairie about in the past.  BP was 150/90. Was ok in the evening. She is only taking Hydralazine  as prescribed. - holding when BP is too low like this AM. She has not been able to tolerate other medications including Losartan, Amlodipine  or Benazepril.  Advised I will forward this information to Dr Lavonne Prairie for his review and she will be contacted with any further instructions.

## 2024-02-16 NOTE — Telephone Encounter (Signed)
 [name] Grates, MD to [name] [name] Triage (Selected Message)     02/16/24  8:42 AM The suggestions will be as in the note. She has labile BPs and should take the hydral as prescribed for increased readings and hold if low.  She also is to have her anxiety managed by her PCP.  She is intolerant of many other meds so therapy is limited.   Went over the information above with the patient. She verbalized understanding.

## 2024-02-19 ENCOUNTER — Ambulatory Visit: Payer: Self-pay | Admitting: Cardiology

## 2024-02-21 DIAGNOSIS — I872 Venous insufficiency (chronic) (peripheral): Secondary | ICD-10-CM | POA: Diagnosis not present

## 2024-02-21 DIAGNOSIS — E6609 Other obesity due to excess calories: Secondary | ICD-10-CM | POA: Diagnosis not present

## 2024-02-21 DIAGNOSIS — H3530 Unspecified macular degeneration: Secondary | ICD-10-CM | POA: Diagnosis not present

## 2024-02-21 DIAGNOSIS — M25562 Pain in left knee: Secondary | ICD-10-CM | POA: Diagnosis not present

## 2024-02-21 DIAGNOSIS — I10 Essential (primary) hypertension: Secondary | ICD-10-CM | POA: Diagnosis not present

## 2024-02-21 DIAGNOSIS — R3915 Urgency of urination: Secondary | ICD-10-CM | POA: Diagnosis not present

## 2024-02-21 DIAGNOSIS — F419 Anxiety disorder, unspecified: Secondary | ICD-10-CM | POA: Diagnosis not present

## 2024-02-21 DIAGNOSIS — E559 Vitamin D deficiency, unspecified: Secondary | ICD-10-CM | POA: Diagnosis not present

## 2024-02-21 DIAGNOSIS — I495 Sick sinus syndrome: Secondary | ICD-10-CM | POA: Diagnosis not present

## 2024-02-22 ENCOUNTER — Encounter (INDEPENDENT_AMBULATORY_CARE_PROVIDER_SITE_OTHER): Admitting: Ophthalmology

## 2024-02-22 DIAGNOSIS — D3131 Benign neoplasm of right choroid: Secondary | ICD-10-CM

## 2024-02-22 DIAGNOSIS — H353231 Exudative age-related macular degeneration, bilateral, with active choroidal neovascularization: Secondary | ICD-10-CM | POA: Diagnosis not present

## 2024-02-22 DIAGNOSIS — H43813 Vitreous degeneration, bilateral: Secondary | ICD-10-CM | POA: Diagnosis not present

## 2024-02-22 DIAGNOSIS — I10 Essential (primary) hypertension: Secondary | ICD-10-CM

## 2024-02-22 DIAGNOSIS — H35033 Hypertensive retinopathy, bilateral: Secondary | ICD-10-CM

## 2024-02-23 DIAGNOSIS — F419 Anxiety disorder, unspecified: Secondary | ICD-10-CM | POA: Diagnosis not present

## 2024-02-23 DIAGNOSIS — M25562 Pain in left knee: Secondary | ICD-10-CM | POA: Diagnosis not present

## 2024-02-23 DIAGNOSIS — R3915 Urgency of urination: Secondary | ICD-10-CM | POA: Diagnosis not present

## 2024-02-23 DIAGNOSIS — I10 Essential (primary) hypertension: Secondary | ICD-10-CM | POA: Diagnosis not present

## 2024-02-23 DIAGNOSIS — I872 Venous insufficiency (chronic) (peripheral): Secondary | ICD-10-CM | POA: Diagnosis not present

## 2024-02-23 DIAGNOSIS — E559 Vitamin D deficiency, unspecified: Secondary | ICD-10-CM | POA: Diagnosis not present

## 2024-02-23 DIAGNOSIS — I495 Sick sinus syndrome: Secondary | ICD-10-CM | POA: Diagnosis not present

## 2024-02-23 DIAGNOSIS — E6609 Other obesity due to excess calories: Secondary | ICD-10-CM | POA: Diagnosis not present

## 2024-02-23 DIAGNOSIS — H3530 Unspecified macular degeneration: Secondary | ICD-10-CM | POA: Diagnosis not present

## 2024-02-27 DIAGNOSIS — R3915 Urgency of urination: Secondary | ICD-10-CM | POA: Diagnosis not present

## 2024-02-27 DIAGNOSIS — H3530 Unspecified macular degeneration: Secondary | ICD-10-CM | POA: Diagnosis not present

## 2024-02-27 DIAGNOSIS — E6609 Other obesity due to excess calories: Secondary | ICD-10-CM | POA: Diagnosis not present

## 2024-02-27 DIAGNOSIS — E559 Vitamin D deficiency, unspecified: Secondary | ICD-10-CM | POA: Diagnosis not present

## 2024-02-27 DIAGNOSIS — I10 Essential (primary) hypertension: Secondary | ICD-10-CM | POA: Diagnosis not present

## 2024-02-27 DIAGNOSIS — I495 Sick sinus syndrome: Secondary | ICD-10-CM | POA: Diagnosis not present

## 2024-02-27 DIAGNOSIS — F419 Anxiety disorder, unspecified: Secondary | ICD-10-CM | POA: Diagnosis not present

## 2024-02-27 DIAGNOSIS — I872 Venous insufficiency (chronic) (peripheral): Secondary | ICD-10-CM | POA: Diagnosis not present

## 2024-02-27 DIAGNOSIS — M25562 Pain in left knee: Secondary | ICD-10-CM | POA: Diagnosis not present

## 2024-03-14 DIAGNOSIS — E6609 Other obesity due to excess calories: Secondary | ICD-10-CM | POA: Diagnosis not present

## 2024-03-14 DIAGNOSIS — I10 Essential (primary) hypertension: Secondary | ICD-10-CM | POA: Diagnosis not present

## 2024-03-14 DIAGNOSIS — E559 Vitamin D deficiency, unspecified: Secondary | ICD-10-CM | POA: Diagnosis not present

## 2024-03-14 DIAGNOSIS — M25562 Pain in left knee: Secondary | ICD-10-CM | POA: Diagnosis not present

## 2024-03-14 DIAGNOSIS — I495 Sick sinus syndrome: Secondary | ICD-10-CM | POA: Diagnosis not present

## 2024-03-14 DIAGNOSIS — I872 Venous insufficiency (chronic) (peripheral): Secondary | ICD-10-CM | POA: Diagnosis not present

## 2024-03-14 DIAGNOSIS — R3915 Urgency of urination: Secondary | ICD-10-CM | POA: Diagnosis not present

## 2024-03-14 DIAGNOSIS — H3530 Unspecified macular degeneration: Secondary | ICD-10-CM | POA: Diagnosis not present

## 2024-03-14 DIAGNOSIS — F419 Anxiety disorder, unspecified: Secondary | ICD-10-CM | POA: Diagnosis not present

## 2024-03-20 DIAGNOSIS — E559 Vitamin D deficiency, unspecified: Secondary | ICD-10-CM | POA: Diagnosis not present

## 2024-03-20 DIAGNOSIS — R3915 Urgency of urination: Secondary | ICD-10-CM | POA: Diagnosis not present

## 2024-03-20 DIAGNOSIS — E6609 Other obesity due to excess calories: Secondary | ICD-10-CM | POA: Diagnosis not present

## 2024-03-20 DIAGNOSIS — I872 Venous insufficiency (chronic) (peripheral): Secondary | ICD-10-CM | POA: Diagnosis not present

## 2024-03-20 DIAGNOSIS — M25562 Pain in left knee: Secondary | ICD-10-CM | POA: Diagnosis not present

## 2024-03-20 DIAGNOSIS — F419 Anxiety disorder, unspecified: Secondary | ICD-10-CM | POA: Diagnosis not present

## 2024-03-20 DIAGNOSIS — I10 Essential (primary) hypertension: Secondary | ICD-10-CM | POA: Diagnosis not present

## 2024-03-20 DIAGNOSIS — I495 Sick sinus syndrome: Secondary | ICD-10-CM | POA: Diagnosis not present

## 2024-03-20 DIAGNOSIS — H3530 Unspecified macular degeneration: Secondary | ICD-10-CM | POA: Diagnosis not present

## 2024-03-22 ENCOUNTER — Ambulatory Visit: Admitting: Cardiology

## 2024-03-27 ENCOUNTER — Encounter: Payer: Self-pay | Admitting: Podiatry

## 2024-03-27 ENCOUNTER — Ambulatory Visit: Admitting: Podiatry

## 2024-03-27 DIAGNOSIS — B351 Tinea unguium: Secondary | ICD-10-CM

## 2024-03-27 DIAGNOSIS — M79675 Pain in left toe(s): Secondary | ICD-10-CM | POA: Diagnosis not present

## 2024-03-27 DIAGNOSIS — M79674 Pain in right toe(s): Secondary | ICD-10-CM

## 2024-03-28 ENCOUNTER — Encounter (INDEPENDENT_AMBULATORY_CARE_PROVIDER_SITE_OTHER): Admitting: Ophthalmology

## 2024-03-28 DIAGNOSIS — H35033 Hypertensive retinopathy, bilateral: Secondary | ICD-10-CM

## 2024-03-28 DIAGNOSIS — I10 Essential (primary) hypertension: Secondary | ICD-10-CM

## 2024-03-28 DIAGNOSIS — D3131 Benign neoplasm of right choroid: Secondary | ICD-10-CM | POA: Diagnosis not present

## 2024-03-28 DIAGNOSIS — H43813 Vitreous degeneration, bilateral: Secondary | ICD-10-CM

## 2024-03-28 DIAGNOSIS — H353231 Exudative age-related macular degeneration, bilateral, with active choroidal neovascularization: Secondary | ICD-10-CM | POA: Diagnosis not present

## 2024-03-28 NOTE — Progress Notes (Signed)
 Subjective:   Patient ID: [name] [name], female   DOB: 88 y.o.   MRN: [id number]   HPI Chief Complaint  Patient presents with   RFC    Rm12 Routine foot care/ patient reports no concerns today.    88 year old female presents the office with above concerns.  States that her nails are thick elongated she is not able to bend over and trim them herself.  No swelling redness or drainage.  No lesions.  No other concerns.        Objective:  Physical Exam  General: AAO x3, NAD  Dermatological: Nails are hypertrophic, dystrophic, brittle, discolored, elongated 10. No surrounding redness or drainage. Tenderness nails 1-5 bilaterally.  Hyperkeratotic lesion noted along the medial aspect the right fifth toe.  There is no underlying ulceration, drainage or any signs of infection.  Vascular: Dorsalis Pedis artery and Posterior Tibial artery pedal pulses are 2/4 bilateral with immedate capillary fill time. There is no pain with calf compression, swelling, warmth, erythema.   Neruologic: Grossly intact via light touch bilateral.   Musculoskeletal: Digital contractures present.   Gait: Unassisted, Nonantalgic.       Assessment:   88 year old female with symptomatic onychomycosis     Plan:  -Treatment options discussed including all alternatives, risks, and complications -Etiology of symptoms were discussed -Sharply debrided nails x 10 without any complications or bleeding - Daily foot inspection.  Return in about 3 months (around 06/27/2024).  Donnice JONELLE Fees DPM

## 2024-04-17 NOTE — Progress Notes (Signed)
 Remote pacemaker transmission.

## 2024-04-17 NOTE — Addendum Note (Signed)
 Addended by: VICCI SELLER A on: 04/17/2024 01:28 PM   Modules accepted: Orders

## 2024-05-02 ENCOUNTER — Encounter (INDEPENDENT_AMBULATORY_CARE_PROVIDER_SITE_OTHER): Admitting: Ophthalmology

## 2024-05-02 DIAGNOSIS — I10 Essential (primary) hypertension: Secondary | ICD-10-CM

## 2024-05-02 DIAGNOSIS — H353231 Exudative age-related macular degeneration, bilateral, with active choroidal neovascularization: Secondary | ICD-10-CM | POA: Diagnosis not present

## 2024-05-02 DIAGNOSIS — H35033 Hypertensive retinopathy, bilateral: Secondary | ICD-10-CM | POA: Diagnosis not present

## 2024-05-02 DIAGNOSIS — D3131 Benign neoplasm of right choroid: Secondary | ICD-10-CM | POA: Diagnosis not present

## 2024-05-02 DIAGNOSIS — H43813 Vitreous degeneration, bilateral: Secondary | ICD-10-CM | POA: Diagnosis not present

## 2024-05-15 ENCOUNTER — Ambulatory Visit: Payer: Medicare PPO

## 2024-05-15 DIAGNOSIS — I442 Atrioventricular block, complete: Secondary | ICD-10-CM | POA: Diagnosis not present

## 2024-05-16 LAB — CUP PACEART REMOTE DEVICE CHECK
Battery Impedance: 1833 Ohm
Battery Remaining Longevity: 39 mo
Battery Voltage: 2.75 V
Brady Statistic AP VP Percent: 8 %
Brady Statistic AP VS Percent: 0 %
Brady Statistic AS VP Percent: 92 %
Brady Statistic AS VS Percent: 0 %
Date Time Interrogation Session: 20250909194348
Implantable Lead Connection Status: 753985
Implantable Lead Connection Status: 753985
Implantable Lead Implant Date: 20160512
Implantable Lead Implant Date: 20160512
Implantable Lead Location: 753859
Implantable Lead Location: 753860
Implantable Lead Model: 5076
Implantable Lead Model: 5092
Implantable Pulse Generator Implant Date: 20160512
Lead Channel Impedance Value: 479 Ohm
Lead Channel Impedance Value: 750 Ohm
Lead Channel Pacing Threshold Amplitude: 0.75 V
Lead Channel Pacing Threshold Amplitude: 0.75 V
Lead Channel Pacing Threshold Pulse Width: 0.4 ms
Lead Channel Pacing Threshold Pulse Width: 0.4 ms
Lead Channel Setting Pacing Amplitude: 2 V
Lead Channel Setting Pacing Amplitude: 2.5 V
Lead Channel Setting Pacing Pulse Width: 0.4 ms
Lead Channel Setting Sensing Sensitivity: 2.8 mV
Zone Setting Status: 755011
Zone Setting Status: 755011

## 2024-05-22 ENCOUNTER — Encounter: Admitting: Student

## 2024-05-24 NOTE — Progress Notes (Signed)
 Remote PPM Transmission

## 2024-05-25 ENCOUNTER — Ambulatory Visit: Attending: Student | Admitting: Cardiology

## 2024-05-25 ENCOUNTER — Encounter: Payer: Self-pay | Admitting: Student

## 2024-05-25 VITALS — BP 126/76 | HR 74 | Ht 64.0 in | Wt 181.2 lb

## 2024-05-25 DIAGNOSIS — I442 Atrioventricular block, complete: Secondary | ICD-10-CM

## 2024-05-25 DIAGNOSIS — I10 Essential (primary) hypertension: Secondary | ICD-10-CM

## 2024-05-25 LAB — CUP PACEART INCLINIC DEVICE CHECK
Battery Impedance: 1802 Ohm
Battery Remaining Longevity: 40 mo
Battery Voltage: 2.75 V
Brady Statistic AP VP Percent: 7 %
Brady Statistic AP VS Percent: 0 %
Brady Statistic AS VP Percent: 93 %
Brady Statistic AS VS Percent: 0 %
Date Time Interrogation Session: 20250919112601
Implantable Lead Connection Status: 753985
Implantable Lead Connection Status: 753985
Implantable Lead Implant Date: 20160512
Implantable Lead Implant Date: 20160512
Implantable Lead Location: 753859
Implantable Lead Location: 753860
Implantable Lead Model: 5076
Implantable Lead Model: 5092
Implantable Pulse Generator Implant Date: 20160512
Lead Channel Impedance Value: 478 Ohm
Lead Channel Impedance Value: 716 Ohm
Lead Channel Pacing Threshold Amplitude: 0.75 V
Lead Channel Pacing Threshold Amplitude: 0.75 V
Lead Channel Pacing Threshold Amplitude: 0.75 V
Lead Channel Pacing Threshold Amplitude: 0.75 V
Lead Channel Pacing Threshold Pulse Width: 0.4 ms
Lead Channel Pacing Threshold Pulse Width: 0.4 ms
Lead Channel Pacing Threshold Pulse Width: 0.4 ms
Lead Channel Pacing Threshold Pulse Width: 0.4 ms
Lead Channel Sensing Intrinsic Amplitude: 1.4 mV
Lead Channel Setting Pacing Amplitude: 2 V
Lead Channel Setting Pacing Amplitude: 2.5 V
Lead Channel Setting Pacing Pulse Width: 0.4 ms
Lead Channel Setting Sensing Sensitivity: 2.8 mV
Zone Setting Status: 755011
Zone Setting Status: 755011

## 2024-05-25 NOTE — Patient Instructions (Signed)
 Medication Instructions:  Your physician recommends that you continue on your current medications as directed. Please refer to the Current Medication list given to you today.  *If you need a refill on your cardiac medications before your next appointment, please call your pharmacy*  Lab Work: NONE ordered at this time of appointment   Testing/Procedures: NONE ordered at this time of appointment   Follow-Up: At [facility], you and your health needs are our priority.  As part of our continuing mission to provide you with exceptional heart care, our providers are all part of one team.  This team includes your primary Cardiologist (physician) and Advanced Practice Providers or APPs (Physician Assistants and Nurse Practitioners) who all work together to provide you with the care you need, when you need it.  Your next appointment:   1 year(s)  Provider:   Dr. Kennyth    We recommend signing up for the patient portal called MyChart.  Sign up information is provided on this After Visit Summary.  MyChart is used to connect with patients for Virtual Visits (Telemedicine).  Patients are able to view lab/test results, encounter notes, upcoming appointments, etc.  Non-urgent messages can be sent to your provider as well.   To learn more about what you can do with MyChart, go to [email or web address].

## 2024-05-25 NOTE — Progress Notes (Signed)
  Electrophysiology Office Note:   ID:  [name] [name], DOB Sep 14, 1935, MRN [id number]  Primary Cardiologist: Lynwood Schilling, MD Electrophysiologist: None      History of Present Illness:   [name] [name] is a 88 y.o. female with h/o complete heart block s/p PPM in 01/2015, mild aortic stenosis, hypertension, and anxiety seen today for routine electrophysiology followup.   Since last being seen in our clinic the patient reports doing well overall. Reports stable/chronic fatigue with exertion, has to take breaks.  she denies chest pain, palpitations, dyspnea, PND, orthopnea, nausea, vomiting, dizziness, syncope, edema, weight gain, or early satiety.   Review of systems complete and found to be negative unless listed in HPI.   EP Information / Studies Reviewed:    EKG is not ordered today. EKG from 12/12/23 reviewed which showed A sensed V paced rhythm at 83 bpm.       PPM Interrogation-  reviewed in detail today,  See PACEART report.  Arrhythmia/Device History PPM- Medtronic- [employer]   Physical Exam:   VS:  There were no vitals taken for this visit.   Wt Readings from Last 3 Encounters:  02/02/24 185 lb (83.9 kg)  12/13/23 186 lb 6.4 oz (84.6 kg)  08/18/23 182 lb (82.6 kg)     GEN: No acute distress  NECK: No JVD; No carotid bruits CARDIAC: Regular rate and rhythm, no murmurs, rubs, gallops RESPIRATORY:  Clear to auscultation without rales, wheezing or rhonchi  ABDOMEN: Soft, non-tender, non-distended EXTREMITIES:  No edema; No deformity   ASSESSMENT AND PLAN:    CHB s/p Medtronic PPM  Normal PPM function See Pace Art report No changes today  Hypertension Blood pressure well controlled on current regimen.      Disposition:   Follow up with Dr. Kennyth in 12 months  Signed, Artist Pouch, PA-C

## 2024-05-27 ENCOUNTER — Ambulatory Visit: Payer: Self-pay | Admitting: Cardiology

## 2024-06-02 ENCOUNTER — Ambulatory Visit: Payer: Self-pay | Admitting: Cardiology

## 2024-06-06 ENCOUNTER — Encounter (INDEPENDENT_AMBULATORY_CARE_PROVIDER_SITE_OTHER): Admitting: Ophthalmology

## 2024-06-06 DIAGNOSIS — H353231 Exudative age-related macular degeneration, bilateral, with active choroidal neovascularization: Secondary | ICD-10-CM | POA: Diagnosis not present

## 2024-06-06 DIAGNOSIS — D3131 Benign neoplasm of right choroid: Secondary | ICD-10-CM | POA: Diagnosis not present

## 2024-06-06 DIAGNOSIS — H43813 Vitreous degeneration, bilateral: Secondary | ICD-10-CM

## 2024-06-06 DIAGNOSIS — I10 Essential (primary) hypertension: Secondary | ICD-10-CM | POA: Diagnosis not present

## 2024-06-06 DIAGNOSIS — H35033 Hypertensive retinopathy, bilateral: Secondary | ICD-10-CM | POA: Diagnosis not present

## 2024-07-11 ENCOUNTER — Encounter (INDEPENDENT_AMBULATORY_CARE_PROVIDER_SITE_OTHER): Admitting: Ophthalmology

## 2024-07-11 DIAGNOSIS — H35033 Hypertensive retinopathy, bilateral: Secondary | ICD-10-CM

## 2024-07-11 DIAGNOSIS — H43813 Vitreous degeneration, bilateral: Secondary | ICD-10-CM | POA: Diagnosis not present

## 2024-07-11 DIAGNOSIS — H353231 Exudative age-related macular degeneration, bilateral, with active choroidal neovascularization: Secondary | ICD-10-CM

## 2024-07-11 DIAGNOSIS — D3131 Benign neoplasm of right choroid: Secondary | ICD-10-CM | POA: Diagnosis not present

## 2024-07-11 DIAGNOSIS — I10 Essential (primary) hypertension: Secondary | ICD-10-CM | POA: Diagnosis not present

## 2024-07-15 NOTE — Progress Notes (Signed)
 Cardiology Office Note:    Date:  07/24/2024   ID:  [name] [name], DOB 1935/12/28, MRN [id number]  PCP:  Charlott Dorn LABOR, MD  Cardiologist:  Lynwood Schilling, MD     Referring MD: Charlott Dorn LABOR, *   Chief Complaint: follow-up of hypertension  History of Present Illness:    [name] [name] is a 88 y.o. female with a history of  history of complete heart block s/p PPM in 01/2015, mild aortic stenosis, hypertension, and anxiety who is followed by Dr. Schilling and presents today for routine follow-up.    Patient is primary followed by Cardiology for complete heart block. She underwent placement of a Medtronic dual chamber PPM in 01/2015. Most recent Echo in 07/2020 showed LVEF of 50-55% with septal dyssynchrony consistent with ventricular pacing, mild LHV, and grade 1 diastolic dysfunction as well as mild aortic stenosis.   She was last seen by Dr. Schilling in 01/2024 at which time she reported some chest discomfort when her BP goes up but was overall doing well. She was advised to continue to monitor her BP twice daily. She was continued on Hydralazine  twice daily but was instructed to hold this if systolic BP <100 and take an extra dose if systolic BP >170. She was recently seen by Artist Pouch, EP PA-C, at which time she reported chronic and stable fatigue with exertion but was overall stable.  PPM was interrogated and functioning normally.   Patient presents today for follow-up.  She is doing well from a cardiac standpoint.  For the most part, her BP is well controlled for her age around 1 2-140s/70s-90s.  However, she occasionally has higher readings.  Reviewed her BP log and looks like she has only had 2 readings with systolic BP >170 recently.  She denies any chest pain.  She reports rare episodes of pain/tightness across her whole upper abdomen.  This occurs at rest and resolves with 2 doses of Aspirin  and antacid. It is not related to exertion.  She states she has had this pain  for about a decade (since before her PPM was placed in 2016).  This occurs very rarely and is stable.  She estimates she has had 2 episodes in the last 6 months.  She also reports mild dyspnea that she describes as getting winded if she is going up the steps but this is not new and is stable.  No other shortness of breath.  No orthopnea or PND.  She denies any palpitations, lightheadedness/dizziness, or syncope.  Patient did have a fall on 07/16/2024.  She is not sure what caused her to fall.  This occurred after she was coming back from using the restroom at around 5:30am.  She does not remember tripping on anything.  She denies any pain or lightheadedness/dizziness prior to the fall.  She did not have any loss of consciousness. She called our office after the fall and requested a remote interrogation of her device. Per telephone note from 07/16/2024, device is functioning normally. She hit her right lower leg on her cane during the fall and sustained a large skin tear.  She went to an Urgent Care a couple days later. They applied steri strips to the skin tear and bandaged her leg. X-ray showed no fractures. She has significant swelling/bruising of that leg now but she does feel like this is improving some.  She denies any edema prior to the fall/ injury.  EKGs/Labs/Other Studies Reviewed:    The following studies were reviewed:  Echocardiogram 07/08/2020: Impressions: 1. Septal dyssynchrony consistent with ventricular pacing. Left  ventricular ejection fraction, by estimation, is 50 to 55%. The left  ventricle has low normal function. The left ventricle demonstrates  regional wall motion abnormalities (see scoring  diagram/findings for description). There is mild concentric left  ventricular hypertrophy. Left ventricular diastolic parameters are  consistent with Grade I diastolic dysfunction (impaired relaxation).  Elevated left ventricular end-diastolic pressure.   2. Right ventricular systolic  function is normal. The right ventricular  size is normal. There is normal pulmonary artery systolic pressure.   3. The mitral valve is normal in structure. No evidence of mitral valve  regurgitation. No evidence of mitral stenosis.   4. The aortic valve is normal in structure. There is moderate  calcification of the aortic valve. There is moderate thickening of the  aortic valve. Aortic valve regurgitation is not visualized. Mild aortic  valve stenosis. Aortic valve area, by VTI  measures 1.02 cm. Aortic valve mean gradient measures 10.0 mmHg. Aortic  valve Vmax measures 2.22 m/s.   5. The inferior vena cava is normal in size with greater than 50%  respiratory variability, suggesting right atrial pressure of 3 mmHg.   EKG:  EKG not ordered today.   Recent Labs: 12/12/2023: ALT 25; BUN 17; Creatinine, Ser 0.85; Hemoglobin 14.3; Platelets 224; Potassium 4.4; Sodium 137  Recent Lipid Panel    Component Value Date/Time   CHOL 136 01/16/2015 0523   TRIG 84 01/16/2015 0523   HDL 67 01/16/2015 0523   CHOLHDL 2.0 01/16/2015 0523   VLDL 17 01/16/2015 0523   LDLCALC 52 01/16/2015 0523    Physical Exam:    Vital Signs: BP 114/72   Pulse 74   Ht 5' 4 (1.626 m)   Wt 189 lb (85.7 kg)   SpO2 97%   BMI 32.44 kg/m     Wt Readings from Last 3 Encounters:  07/24/24 189 lb (85.7 kg)  05/25/24 181 lb 3.2 oz (82.2 kg)  02/02/24 185 lb (83.9 kg)     General: 88 y.o. Caucasian female in no acute distress. HEENT: Normocephalic and atraumatic. Sclera clear.  Neck: Supple. No JVD. Heart: RRR. III-/VI systolic murmur.  Lungs: No increased work of breathing. Clear to ausculation bilaterally. No wheezes, rhonchi, or rales.  Extremities: 2+ pitting edema with ecchymosis of right lower extremity. Trace edema of left lower extremity.  Skin: Warm and dry. Neuro: No focal deficits. Psych: Normal affect. Responds appropriately.  Assessment:    1. Complete heart block s/p PPM   2. Mild aortic  stenosis   3. Primary hypertension   4. Pain of upper abdomen   5. Fall, initial encounter   6. Edema of right lower extremity     Plan:    Complete Heart Block s/p PPM S/p Medtronic dual chamber pacemaker in 01/2015. Last device check in 05/2024 showed she was AS-VP 93% of the time and AP-VP 7% of the time.   - Followed by Dr. Kennyth.    Mild Aortic Stenosis Noted on Echo in 07/2020.  - Per Dr. Denver last note, plan is to manage this conservatively given her advanced age. She also states she would not want surgery even if this progressed.   Hypertension She has a history of labile BP. - BP well controlled in the office. Reviewed home BP and it is usually well controlled for her age.  - Continue Hydralazine  10mg  twice daily. Hold if systolic BP <100 and take an extra dose  if systolic BP >170.  Abdominal Pain Patient reports rare episodes of pain across her entire upper abdomen under her breast that occurs at rest and improves after she takes 2 doses of Aspirin  and an antacid.  This is not associated with exertion.  She states she has had this pain for about a decade (since before PPM was placed in 2016) and that it is stable. She denies any real chest pain. - Low suspicion that this is cardiac in nature. Possibly GI in nature. Briefly discussed non-invasive ischemic imaging but she declined.   Fall Right Lower Extremity Edema Patient had a fall on 07/16/2024.  Cause of fall is unclear.  She denies any pain or lightheadedness/dizziness prior to the fall but she also does not remember tripping on anything.  Device was interrogated and showed normal function.  She denies any loss of consciousness.  She hit her right leg on her cane during the fall and sustained a large skin tear.  She was seen in Urgent Care after this and this was bandaged. - She has significant right leg swelling and bruising but states this actually looks a little better than before. - Suspect this is dependent edema  from her injury.  Recommended icing and elevating her leg multiple times throughout the day.  Low suspicion for DVT; however, advised patient to follow-up with PCP to rule this out if edema fails to improve or worsens with the above conservative measures.  Disposition: Follow up in 6 months.    Signed, Zani Kyllonen E Braley Luckenbaugh, PA-C  07/24/2024 5:30 PM    [facility]

## 2024-07-16 ENCOUNTER — Telehealth: Payer: Self-pay | Admitting: Cardiology

## 2024-07-16 NOTE — Telephone Encounter (Signed)
 Pt called in stating she fell and asked if someone can view her pacemaker to make sure its okay. She denies that she was dizzy prior to or that she passed out. She states she wasn't sure how she fell but she is not having any symptoms.

## 2024-07-16 NOTE — Telephone Encounter (Signed)
 Transmission received.  Device function WNL.  Returned call to Pt to advise device is functioning normally.  She states she does not know why she fell.  She states she was excited to go back to bed after using the restroom.  She states she scraped her leg with her cane, but does not believe she broke anything.  Was able to get back up.  Pt thanked nurse for calling back.

## 2024-07-21 DIAGNOSIS — S8991XA Unspecified injury of right lower leg, initial encounter: Secondary | ICD-10-CM | POA: Diagnosis not present

## 2024-07-24 ENCOUNTER — Encounter: Payer: Self-pay | Admitting: Student

## 2024-07-24 ENCOUNTER — Ambulatory Visit: Attending: Student | Admitting: Student

## 2024-07-24 VITALS — BP 114/72 | HR 74 | Ht 64.0 in | Wt 189.0 lb

## 2024-07-24 DIAGNOSIS — I442 Atrioventricular block, complete: Secondary | ICD-10-CM

## 2024-07-24 DIAGNOSIS — W19XXXA Unspecified fall, initial encounter: Secondary | ICD-10-CM | POA: Diagnosis not present

## 2024-07-24 DIAGNOSIS — R1010 Upper abdominal pain, unspecified: Secondary | ICD-10-CM | POA: Diagnosis not present

## 2024-07-24 DIAGNOSIS — I350 Nonrheumatic aortic (valve) stenosis: Secondary | ICD-10-CM | POA: Diagnosis not present

## 2024-07-24 DIAGNOSIS — I10 Essential (primary) hypertension: Secondary | ICD-10-CM | POA: Diagnosis not present

## 2024-07-24 DIAGNOSIS — R600 Localized edema: Secondary | ICD-10-CM | POA: Diagnosis not present

## 2024-07-24 NOTE — Patient Instructions (Signed)
 Thank you for choosing [facility]!     Medication Instructions:  No medication changes were made during today's visit.   Ice and elevate your leg. If pain and swelling is not improving, please follow up with your PCP to have scanning done to rule out DVT. *If you need a refill on your cardiac medications before your next appointment, please call your pharmacy*   Lab Work: No labs were ordered during today's visit.  If you have labs (blood work) drawn today and your tests are completely normal, you will receive your results only by: MyChart Message (if you have MyChart) OR A paper copy in the mail If you have any lab test that is abnormal or we need to change your treatment, we will call you to review the results.   Testing/Procedures: No procedures were ordered during today's visit.   Your next appointment:   6 month(s)   Provider:   Lynwood Schilling, MD or Callie Goodrich     Follow-Up: At [facility], you and your health needs are our priority.  As part of our continuing mission to provide you with exceptional heart care, we have created designated Provider Care Teams.  These Care Teams include your primary Cardiologist (physician) and Advanced Practice Providers (APPs -  Physician Assistants and Nurse Practitioners) who all work together to provide you with the care you need, when you need it. We recommend signing up for the patient portal called MyChart.  Sign up information is provided on this After Visit Summary.  MyChart is used to connect with patients for Virtual Visits (Telemedicine).  Patients are able to view lab/test results, encounter notes, upcoming appointments, etc.  Non-urgent messages can be sent to your provider as well.   To learn more about what you can do with MyChart, go to [email or web address].

## 2024-08-06 ENCOUNTER — Ambulatory Visit: Admitting: Podiatry

## 2024-08-08 ENCOUNTER — Inpatient Hospital Stay (HOSPITAL_COMMUNITY)
Admission: EM | Admit: 2024-08-08 | Discharge: 2024-08-12 | DRG: 603 | Disposition: A | Attending: Internal Medicine | Admitting: Internal Medicine

## 2024-08-08 ENCOUNTER — Encounter (HOSPITAL_COMMUNITY): Payer: Self-pay

## 2024-08-08 ENCOUNTER — Encounter (INDEPENDENT_AMBULATORY_CARE_PROVIDER_SITE_OTHER): Admitting: Ophthalmology

## 2024-08-08 ENCOUNTER — Other Ambulatory Visit: Payer: Self-pay

## 2024-08-08 ENCOUNTER — Emergency Department (HOSPITAL_COMMUNITY)

## 2024-08-08 DIAGNOSIS — I10 Essential (primary) hypertension: Secondary | ICD-10-CM | POA: Diagnosis present

## 2024-08-08 DIAGNOSIS — L089 Local infection of the skin and subcutaneous tissue, unspecified: Secondary | ICD-10-CM | POA: Diagnosis not present

## 2024-08-08 DIAGNOSIS — L03115 Cellulitis of right lower limb: Principal | ICD-10-CM

## 2024-08-08 DIAGNOSIS — W19XXXA Unspecified fall, initial encounter: Secondary | ICD-10-CM | POA: Diagnosis not present

## 2024-08-08 DIAGNOSIS — M799 Soft tissue disorder, unspecified: Secondary | ICD-10-CM | POA: Diagnosis not present

## 2024-08-08 DIAGNOSIS — I350 Nonrheumatic aortic (valve) stenosis: Secondary | ICD-10-CM

## 2024-08-08 DIAGNOSIS — R52 Pain, unspecified: Secondary | ICD-10-CM | POA: Diagnosis not present

## 2024-08-08 DIAGNOSIS — L0390 Cellulitis, unspecified: Secondary | ICD-10-CM | POA: Diagnosis not present

## 2024-08-08 DIAGNOSIS — M7989 Other specified soft tissue disorders: Secondary | ICD-10-CM | POA: Diagnosis not present

## 2024-08-08 DIAGNOSIS — S81801D Unspecified open wound, right lower leg, subsequent encounter: Secondary | ICD-10-CM | POA: Diagnosis not present

## 2024-08-08 DIAGNOSIS — R600 Localized edema: Secondary | ICD-10-CM | POA: Diagnosis not present

## 2024-08-08 LAB — CBC WITH DIFFERENTIAL/PLATELET
Abs Immature Granulocytes: 0.01 K/uL (ref 0.00–0.07)
Basophils Absolute: 0 K/uL (ref 0.0–0.1)
Basophils Relative: 1 %
Eosinophils Absolute: 0.1 K/uL (ref 0.0–0.5)
Eosinophils Relative: 2 %
HCT: 40.2 % (ref 36.0–46.0)
Hemoglobin: 12.9 g/dL (ref 12.0–15.0)
Immature Granulocytes: 0 %
Lymphocytes Relative: 16 %
Lymphs Abs: 0.8 K/uL (ref 0.7–4.0)
MCH: 29.3 pg (ref 26.0–34.0)
MCHC: 32.1 g/dL (ref 30.0–36.0)
MCV: 91.2 fL (ref 80.0–100.0)
Monocytes Absolute: 0.6 K/uL (ref 0.1–1.0)
Monocytes Relative: 12 %
Neutro Abs: 3.6 K/uL (ref 1.7–7.7)
Neutrophils Relative %: 69 %
Platelets: 318 K/uL (ref 150–400)
RBC: 4.41 MIL/uL (ref 3.87–5.11)
RDW: 14.6 % (ref 11.5–15.5)
WBC: 5.1 K/uL (ref 4.0–10.5)
nRBC: 0 % (ref 0.0–0.2)

## 2024-08-08 LAB — COMPREHENSIVE METABOLIC PANEL WITH GFR
ALT: 21 U/L (ref 0–44)
AST: 33 U/L (ref 15–41)
Albumin: 4 g/dL (ref 3.5–5.0)
Alkaline Phosphatase: 296 U/L — ABNORMAL HIGH (ref 38–126)
Anion gap: 11 (ref 5–15)
BUN: 16 mg/dL (ref 8–23)
CO2: 25 mmol/L (ref 22–32)
Calcium: 9.6 mg/dL (ref 8.9–10.3)
Chloride: 102 mmol/L (ref 98–111)
Creatinine, Ser: 0.86 mg/dL (ref 0.44–1.00)
GFR, Estimated: >60 mL/min (ref >60)
Glucose, Bld: 107 mg/dL — ABNORMAL HIGH (ref 70–99)
Potassium: 4.1 mmol/L (ref 3.5–5.1)
Sodium: 138 mmol/L (ref 135–145)
Total Bilirubin: 0.4 mg/dL (ref 0.0–1.2)
Total Protein: 7.5 g/dL (ref 6.5–8.1)

## 2024-08-08 LAB — I-STAT CG4 LACTIC ACID, ED: Lactic Acid, Venous: 0.8 mmol/L (ref 0.5–1.9)

## 2024-08-08 MED ORDER — SODIUM CHLORIDE 0.9 % IV SOLN
2.0000 g | Freq: Once | INTRAVENOUS | Status: AC
  Administered 2024-08-08: 2 g via INTRAVENOUS
  Filled 2024-08-08: qty 12.5

## 2024-08-08 MED ORDER — VANCOMYCIN HCL 1500 MG/300ML IV SOLN
1500.0000 mg | Freq: Once | INTRAVENOUS | Status: AC
  Administered 2024-08-09: 1500 mg via INTRAVENOUS
  Filled 2024-08-08: qty 300

## 2024-08-08 NOTE — Progress Notes (Signed)
 ED Pharmacy Antibiotic Sign Off An antibiotic consult was received from an ED provider for Vancomycin  and Cefepime per pharmacy dosing for wound infection. A chart review was completed to assess appropriateness.   The following one time order(s) were placed:  Vancomycin  1500mg  IV Cefepime 2g IV  Further antibiotic and/or antibiotic pharmacy consults should be ordered by the admitting provider if indicated.   Thank you for allowing pharmacy to be a part of this patient's care.   Arvin Gauss, PharmD Clinical Pharmacist 08/08/24 10:51 PM

## 2024-08-08 NOTE — ED Provider Notes (Incomplete)
 [place] EMERGENCY DEPARTMENT AT [facility] Provider Note   CSN: [id number] Arrival date & time: 08/08/24  8069     Patient presents with: Leg Pain   [name] [name] is a 88 y.o. female.  {Add pertinent medical, surgical, social history, OB history to YEP:67052} The history is provided by the patient and medical records.  Leg Pain  88 y.o. F with hx of HTN, CHB s/p pacemaker placement, anxiety, presenting to the ED for infection of her right lower leg.  She sustained a stumble in her house on 07/16/2024, platform of her 4 pronged cane scraped her right lower leg.  She states she was seen by her PCP for this just a few days later, put on course of keflex  which she completed.  She states since that time she has had some increased swelling, redness, and some bodily fatigue.  She was seen in clinic today for follow-up and sent here for admission and IV abx.  She denies fever/chills.  Tetanus is UTD.  Prior to Admission medications   Medication Sig Start Date End Date Taking? Authorizing Provider  acetaminophen  (TYLENOL ) 500 MG tablet Take 500 mg by mouth every 6 (six) hours as needed for mild pain.    [provider]  ALPRAZolam  (XANAX ) 0.25 MG tablet Take 0.125 tablets by mouth daily as needed for anxiety.  02/04/15   [provider]  aspirin  81 MG tablet Take 81 mg by mouth daily.    [provider]  Besifloxacin HCl 0.6 % SUSP Apply 1 drop to eye 4 (four) times daily. For 2 days after injection    [provider]  calcium  carbonate (TUMS EX) 750 MG chewable tablet Chew 1 tablet by mouth 2 (two) times daily as needed.     [provider]  Cholecalciferol  (VITAMIN D3) 2000 units TABS Take 1 tablet by mouth daily.    [provider]  famotidine  (PEPCID ) 20 MG tablet Take 10-20 mg by mouth daily.     [provider]  faricimab -svoa (VABYSMO ) 6 MG/0.05ML SOLN intravitreal injection 6 mg by Intravitreal route every 5 (five)  weeks.    [provider]  fluticasone  (FLONASE ) 50 MCG/ACT nasal spray Place 2 sprays into both nostrils daily as needed for allergies.  12/27/14   [provider]  hydrALAZINE  (APRESOLINE ) 10 MG tablet Take 1 tablet (10 mg total) by mouth in the morning and at bedtime. May take an additional dose of 10 mg Hydralazine  if blood pressure above 170 systolic or hold if blood pressure is less than 100 systolic 02/02/24   Lavona Agent, MD  lidocaine (LIDODERM) 5 % Place 1 patch onto the skin daily as needed. Remove & Discard patch within 12 hours or as directed by MD    [provider]  Magnesium  Hydroxide (PHILLIPS MILK OF MAGNESIA PO) Take 1-2 tablets by mouth 2 (two) times daily as needed.    [provider]  Multiple Vitamins-Minerals (PRESERVISION AREDS) CAPS 1 capsule    [provider]    Allergies: Hctz [hydrochlorothiazide ], Losartan, Macrolides and ketolides, Amlodipine , Benazepril, and Penicillins    Review of Systems  Skin:  Positive for wound.  All other systems reviewed and are negative.   Updated Vital Signs BP (!) 149/67 (BP Location: Right Arm)   Pulse 61   Temp (!) 97.5 F (36.4 C) (Oral)   Resp 15   SpO2 95%   Physical Exam Vitals and nursing note reviewed.  Constitutional:  Appearance: She is well-developed.  HENT:     Head: Normocephalic and atraumatic.  Eyes:     Conjunctiva/sclera: Conjunctivae normal.     Pupils: Pupils are equal, round, and reactive to light.  Cardiovascular:     Rate and Rhythm: Normal rate and regular rhythm.     Heart sounds: Normal heart sounds.  Pulmonary:     Effort: Pulmonary effort is normal.     Breath sounds: Normal breath sounds.  Abdominal:     General: Bowel sounds are normal.     Palpations: Abdomen is soft.  Musculoskeletal:        General: Normal range of motion.     Cervical back: Normal range of motion.     Comments: Abrasion type wound noted to right shin, there is  some purulent appearing drainage present on wound and bandage, significant erythema extending from medial distal right thigh down to ankle (marked by UC prior to arrival), warmth to touch present   Skin:    General: Skin is warm and dry.  Neurological:     Mental Status: She is alert and oriented to person, place, and time.      (all labs ordered are listed, but only abnormal results are displayed) Labs Reviewed  COMPREHENSIVE METABOLIC PANEL WITH GFR - Abnormal; Notable for the following components:      Result Value   Glucose, Bld 107 (*)    Alkaline Phosphatase 296 (*)    All other components within normal limits  CULTURE, BLOOD (ROUTINE X 2)  CULTURE, BLOOD (ROUTINE X 2)  CBC WITH DIFFERENTIAL/PLATELET  I-STAT CG4 LACTIC ACID, ED    EKG: None  Radiology: DG Tibia/Fibula Right Port Result Date: 08/08/2024 CLINICAL DATA:  Cellulitis EXAM: PORTABLE RIGHT TIBIA AND FIBULA - 2 VIEW COMPARISON:  Right tibia and fibula x-ray 07/22/2024 FINDINGS: The bones are osteopenic. There is no acute fracture, dislocation or focal osseous lesion. Joint spaces are maintained. There is diffuse soft tissue swelling and edema of the lower extremity. There is a oval 6.3 x 3.5 by 4.4 cm subcutaneous soft tissue density of the proximal medial left calf. This is unchanged from the prior study. IMPRESSION: 1. No acute fracture or dislocation. 2. Diffuse soft tissue swelling and edema of the lower extremity. 3. Unchanged oval 6.3 x 3.5 x 4.4 cm subcutaneous soft tissue density of the proximal medial left calf. Please correlate clinically. Electronically Signed   By: Greig Pique M.D.   On: 08/08/2024 23:13    {Document cardiac monitor, telemetry assessment procedure when appropriate:32947} Procedures   CRITICAL CARE Performed by: Olam CHRISTELLA Slocumb   Total critical care time: 45 minutes  Critical care time was exclusive of separately billable procedures and treating other patients.  Critical care was  necessary to treat or prevent imminent or life-threatening deterioration.  Critical care was time spent personally by me on the following activities: development of treatment plan with patient and/or surrogate as well as nursing, discussions with consultants, evaluation of patient's response to treatment, examination of patient, obtaining history from patient or surrogate, ordering and performing treatments and interventions, ordering and review of laboratory studies, ordering and review of radiographic studies, pulse oximetry and re-evaluation of patient's condition.   Medications Ordered in the ED - No data to display    {Click here for ABCD2, HEART and other calculators REFRESH Note before signing:1}  Medical Decision Making Amount and/or Complexity of Data Reviewed Labs: ordered. Radiology: ordered and independent interpretation performed. ECG/medicine tests: ordered and independent interpretation performed.  Risk Prescription drug management. Decision regarding hospitalization.   88 y.o. F here with RLE wound.  Initially injured this 07/16/24, put on course of keflex  which she completed.  Has had worsening swelling and redness since then.  Seen in clinic for follow-up today and sent here for IV abx.  She does have abrasion type wound to the RLE, some purulent drainage but does have cellulitis extending from medial thigh down to ankle.  This was marked by urgent care prior to arrival.  I did not appreciate any tissue crepitus or abscess formation.  X-ray without soft tissue gas.  Labs as above--normal lactate, normal white count.  No electrolyte derangement.  Will add on blood cultures.  Given extensiveness of her cellulitis, I do agree she needs admission with IV antibiotics.  Started on vancomycin  and cefepime .  Final diagnoses:  None    ED Discharge Orders     None

## 2024-08-08 NOTE — ED Triage Notes (Signed)
 Pt BIB EMS from home with reports of leg infection for 3 weeks. (Below the knee on right leg) Pt told to come here by urgent care.

## 2024-08-09 ENCOUNTER — Inpatient Hospital Stay (HOSPITAL_COMMUNITY)

## 2024-08-09 ENCOUNTER — Encounter (HOSPITAL_COMMUNITY): Payer: Self-pay | Admitting: Radiology

## 2024-08-09 DIAGNOSIS — M1711 Unilateral primary osteoarthritis, right knee: Secondary | ICD-10-CM | POA: Diagnosis not present

## 2024-08-09 DIAGNOSIS — R609 Edema, unspecified: Secondary | ICD-10-CM | POA: Diagnosis not present

## 2024-08-09 DIAGNOSIS — L0390 Cellulitis, unspecified: Secondary | ICD-10-CM | POA: Diagnosis present

## 2024-08-09 DIAGNOSIS — R600 Localized edema: Secondary | ICD-10-CM | POA: Diagnosis not present

## 2024-08-09 DIAGNOSIS — L538 Other specified erythematous conditions: Secondary | ICD-10-CM | POA: Diagnosis not present

## 2024-08-09 DIAGNOSIS — R52 Pain, unspecified: Secondary | ICD-10-CM | POA: Diagnosis not present

## 2024-08-09 DIAGNOSIS — M7989 Other specified soft tissue disorders: Secondary | ICD-10-CM | POA: Diagnosis not present

## 2024-08-09 DIAGNOSIS — L03115 Cellulitis of right lower limb: Secondary | ICD-10-CM | POA: Diagnosis not present

## 2024-08-09 LAB — BASIC METABOLIC PANEL WITH GFR
Anion gap: 10 (ref 5–15)
BUN: 13 mg/dL (ref 8–23)
CO2: 25 mmol/L (ref 22–32)
Calcium: 9.6 mg/dL (ref 8.9–10.3)
Chloride: 102 mmol/L (ref 98–111)
Creatinine, Ser: 0.75 mg/dL (ref 0.44–1.00)
GFR, Estimated: >60 mL/min (ref >60)
Glucose, Bld: 90 mg/dL (ref 70–99)
Potassium: 4.3 mmol/L (ref 3.5–5.1)
Sodium: 137 mmol/L (ref 135–145)

## 2024-08-09 LAB — CBC
HCT: 36.8 % (ref 36.0–46.0)
Hemoglobin: 12 g/dL (ref 12.0–15.0)
MCH: 30.1 pg (ref 26.0–34.0)
MCHC: 32.6 g/dL (ref 30.0–36.0)
MCV: 92.2 fL (ref 80.0–100.0)
Platelets: 287 K/uL (ref 150–400)
RBC: 3.99 MIL/uL (ref 3.87–5.11)
RDW: 14.6 % (ref 11.5–15.5)
WBC: 5.2 K/uL (ref 4.0–10.5)
nRBC: 0 % (ref 0.0–0.2)

## 2024-08-09 MED ORDER — SENNOSIDES-DOCUSATE SODIUM 8.6-50 MG PO TABS
2.0000 | ORAL_TABLET | Freq: Every day | ORAL | Status: DC
  Administered 2024-08-10 – 2024-08-11 (×2): 2 via ORAL
  Filled 2024-08-09 (×2): qty 2

## 2024-08-09 MED ORDER — ENOXAPARIN SODIUM 40 MG/0.4ML IJ SOSY
40.0000 mg | PREFILLED_SYRINGE | INTRAMUSCULAR | Status: DC
  Administered 2024-08-09 – 2024-08-10 (×2): 40 mg via SUBCUTANEOUS
  Filled 2024-08-09 (×2): qty 0.4

## 2024-08-09 MED ORDER — VANCOMYCIN HCL 1250 MG/250ML IV SOLN
1250.0000 mg | INTRAVENOUS | Status: DC
  Administered 2024-08-10: 1250 mg via INTRAVENOUS
  Filled 2024-08-09: qty 250

## 2024-08-09 MED ORDER — LABETALOL HCL 5 MG/ML IV SOLN
10.0000 mg | INTRAVENOUS | Status: DC | PRN
  Administered 2024-08-11: 10 mg via INTRAVENOUS
  Filled 2024-08-09: qty 4

## 2024-08-09 MED ORDER — ONDANSETRON HCL 4 MG/2ML IJ SOLN: 4.0000 mg | Freq: Four times a day (QID) | INTRAMUSCULAR | Status: DC | PRN

## 2024-08-09 MED ORDER — MELATONIN 5 MG PO TABS: 5.0000 mg | ORAL_TABLET | Freq: Every evening | ORAL | Status: DC | PRN

## 2024-08-09 MED ORDER — MAGNESIUM HYDROXIDE 400 MG/5ML PO SUSP
30.0000 mL | Freq: Every day | ORAL | Status: DC | PRN
  Filled 2024-08-09: qty 30

## 2024-08-09 MED ORDER — SODIUM CHLORIDE 0.9 % IV SOLN
2.0000 g | INTRAVENOUS | Status: DC
  Administered 2024-08-09 – 2024-08-11 (×3): 2 g via INTRAVENOUS
  Filled 2024-08-09 (×3): qty 20

## 2024-08-09 MED ORDER — POLYETHYLENE GLYCOL 3350 17 G PO PACK: 17.0000 g | PACK | Freq: Every day | ORAL | Status: DC | PRN

## 2024-08-09 MED ORDER — HYDRALAZINE HCL 10 MG PO TABS
10.0000 mg | ORAL_TABLET | Freq: Two times a day (BID) | ORAL | Status: DC
  Administered 2024-08-09 – 2024-08-10 (×4): 10 mg via ORAL
  Filled 2024-08-09 (×5): qty 1

## 2024-08-09 MED ORDER — ONDANSETRON HCL 4 MG PO TABS: 4.0000 mg | ORAL_TABLET | Freq: Four times a day (QID) | ORAL | Status: DC | PRN

## 2024-08-09 MED ORDER — ORAL CARE MOUTH RINSE: 15.0000 mL | OROMUCOSAL | Status: DC | PRN

## 2024-08-09 MED ORDER — CALCIUM CARBONATE ANTACID 500 MG PO CHEW
1.0000 | CHEWABLE_TABLET | Freq: Two times a day (BID) | ORAL | Status: DC | PRN
  Administered 2024-08-09 – 2024-08-11 (×3): 200 mg via ORAL
  Filled 2024-08-09 (×3): qty 1

## 2024-08-09 MED ORDER — PANTOPRAZOLE SODIUM 40 MG PO TBEC
40.0000 mg | DELAYED_RELEASE_TABLET | Freq: Every day | ORAL | Status: DC
  Administered 2024-08-09 – 2024-08-12 (×4): 40 mg via ORAL
  Filled 2024-08-09 (×4): qty 1

## 2024-08-09 MED ORDER — ASPIRIN 81 MG PO TBEC
81.0000 mg | DELAYED_RELEASE_TABLET | Freq: Every day | ORAL | Status: DC
  Administered 2024-08-09 – 2024-08-10 (×2): 81 mg via ORAL
  Filled 2024-08-09 (×2): qty 1

## 2024-08-09 MED ORDER — ACETAMINOPHEN 325 MG PO TABS
650.0000 mg | ORAL_TABLET | Freq: Four times a day (QID) | ORAL | Status: DC | PRN
  Administered 2024-08-09 – 2024-08-12 (×2): 650 mg via ORAL
  Filled 2024-08-09 (×3): qty 2

## 2024-08-09 MED ORDER — FAMOTIDINE 20 MG PO TABS
20.0000 mg | ORAL_TABLET | Freq: Every evening | ORAL | Status: DC | PRN
  Administered 2024-08-09 – 2024-08-11 (×3): 20 mg via ORAL
  Filled 2024-08-09 (×3): qty 1

## 2024-08-09 MED ORDER — ACETAMINOPHEN 650 MG RE SUPP: 650.0000 mg | Freq: Four times a day (QID) | RECTAL | Status: DC | PRN

## 2024-08-09 NOTE — Progress Notes (Signed)
   Pt with hematoma on right medial lower leg. Just inferior and medial to right knee. Have asked ortho to take a look to see if this needs to be drained. Discussed with general surgery and they don't drain hematomas in legs.       Camellia Door, DO [facility]

## 2024-08-09 NOTE — Progress Notes (Signed)
 Right lower extremity venous  has been completed. Refer to [facility] under chart review to view preliminary results.   08/09/2024  9:35 AM Sylvan Sookdeo, Ricka BIRCH

## 2024-08-09 NOTE — Assessment & Plan Note (Addendum)
-   IV Rocephin  - Follow up blood cultures - US  DVT (R)LE to rule out DVT  - Follow daily CBC, BMP

## 2024-08-09 NOTE — Assessment & Plan Note (Signed)
-   Continue home hydralazine  10 mg twice daily

## 2024-08-09 NOTE — Plan of Care (Signed)
  Problem: Clinical Measurements: Goal: Will remain free from infection Outcome: Progressing   Problem: Activity: Goal: Risk for activity intolerance will decrease Outcome: Progressing   Problem: Pain Managment: Goal: General experience of comfort will improve and/or be controlled Outcome: Progressing

## 2024-08-09 NOTE — Hospital Course (Addendum)
 [name] [name] is a 88 y.o. year old female with past medical history of pleat heart block status post personal pacemaker in 2016, degenerative disc disease, mild aortic stenosis, and hypertension.  She presents to [facility] ED with right lower extremity edema, erythema, and pain after she sustained a fall in her house on 07/16/2024, where she scratched her leg on the platform of her 4 pronged cane.  She sought care with her PCP a few days later and was put on course of keflex  which she completed.  Since that time she has had increased swelling, redness, pain, and some fatigue.  She was seen in clinic yesterday for follow-up and sent here for admission and IV abx.  She denies fever or chills.    ED Course: On arrival to [facility] ED patient was noted to be afebrile 36.4 C, BP 149/67, HR 61, RR 15, SpO2 95% on room air.  Labs notable for alkaline phosphatase of 296 with normal AST ALT.  Plain film of right tib-fib obtained and shows diffuse soft tissue swelling and edema of the lower extremity as well as an unchanged 6.3 x 3.5 x 4.4 cm subcutaneous soft tissue density of the proximal calf.  She was given cefepime and vancomycin . [facility] contacted for admission.

## 2024-08-09 NOTE — Assessment & Plan Note (Signed)
-   Continue home daily baby aspirin

## 2024-08-09 NOTE — Progress Notes (Signed)
 Pharmacy Antibiotic Note  [name] [name] is a 88 y.o. female admitted on 08/08/2024 with cellulitis.  Pharmacy has been consulted for Vanco dosing.  Active Problem(s): R leg infection for 3 weeks  - stumbled in her house on 07/16/2024, platform of her 4 pronged cane scraped her right lower leg.  She states she was seen by her PCP for this just a few days later, put on course of keflex  which she completed.  ID: Cellulitis s/p skin tear of R LE Afebrile, WBC WNL, Scr <1  Rocephin 12/4>> Vanco 12/3>> Cefepime 12/3 x 1  Plan: Vanc 1500mg /Cef 2g x 1 in ED Vancomycin  1250 mg IV Q 36 hrs. Goal AUC 400-550. Expected AUC: 479 SCr used: 0.8 Rocephin 2g IV q24h   Height: 5' 4 (162.6 cm) Weight: 85.7 kg (188 lb 15 oz) IBW/kg (Calculated) : 54.7  Temp (24hrs), Avg:97.7 F (36.5 C), Min:97.5 F (36.4 C), Max:97.9 F (36.6 C)  Recent Labs  Lab 08/08/24 1946 08/08/24 1956 08/09/24 0851  WBC 5.1  --  5.2  CREATININE 0.86  --  0.75  LATICACIDVEN  --  0.8  --     Estimated Creatinine Clearance: 53.5 mL/min (by C-G formula based on SCr of 0.75 mg/dL).    Allergies  Allergen Reactions   Hctz [Hydrochlorothiazide ] Shortness Of Breath and Other (See Comments)    Very short winded, made her hurt all over    Losartan Other (See Comments)    Skin crawling feelings   Macrolides And Ketolides Other (See Comments)    Pyloric sphincter flareups-unsure of which -mycins   Amlodipine  Other (See Comments)   Benazepril Other (See Comments)    Doesn't work for patient   Penicillins Other (See Comments)    abd pain Has patient had a PCN reaction causing immediate rash, facial/tongue/throat swelling, SOB or lightheadedness with hypotension: No Has patient had a PCN reaction causing severe rash involving mucus membranes or skin necrosis: No Has patient had a PCN reaction that required hospitalization No Has patient had a PCN reaction occurring within the last 10 years: No If all of the above  answers are NO, then may proceed with Cephalosporin use.      [name] [name] [name] [name], PharmD, BCPS Clinical Staff Pharmacist  [name] [name] 08/09/2024 11:28 AM

## 2024-08-09 NOTE — Evaluation (Signed)
 Physical Therapy Evaluation Patient Details Name: [name] [name] MRN: [id number] DOB: [birth year] Today's Date: 08/09/2024  History of Present Illness  [name] [name] is an 88 y.o. female admitted with RLE cellulitis. PMH: pleat heart block status post personal pacemaker in 2016, degenerative disc disease, mild aortic stenosis, and hypertension  Clinical Impression  Pt admitted with above diagnosis. PTA, pt reports ind with SPC or rollator in the home, ind with self care and cooking, has a cleaning service, has good family and neighbor support, spouse present at home but unable to physically assist pt. On eval, pt with good sensation throughout RLE, dressing in tact no anterior/medial lower leg, no drainage noted. Pt performs transfers and ambulation with RW and CGA for safety, slow to power up with BLE braced against bed, ambulation limited by discomfort. Recommend HHPT at d/c with continued family/friend support. Pt currently with functional limitations due to the deficits listed below (see PT Problem List). Pt will benefit from acute skilled PT to increase their independence and safety with mobility to allow discharge.           If plan is discharge home, recommend the following: A little help with walking and/or transfers;A little help with bathing/dressing/bathroom;Assistance with cooking/housework;Assist for transportation;Help with stairs or ramp for entrance   Can travel by private vehicle        Equipment Recommendations None recommended by PT  Recommendations for Other Services       Functional Status Assessment Patient has had a recent decline in their functional status and demonstrates the ability to make significant improvements in function in a reasonable and predictable amount of time.     Precautions / Restrictions Precautions Precautions: Fall Recall of Precautions/Restrictions: Intact Precaution/Restrictions Comments: RLE wound Restrictions Weight Bearing Restrictions Per  Provider Order: No      Mobility  Bed Mobility               General bed mobility comments: in recliner upon arrival    Transfers Overall transfer level: Needs assistance Equipment used: Rolling walker (2 wheels) Transfers: Sit to/from Stand Sit to Stand: Contact guard assist           General transfer comment: cues for hand placement, slow to power up, BLE braced against recliner    Ambulation/Gait Ambulation/Gait assistance: Contact guard assist Gait Distance (Feet): 80 Feet Assistive device: Rolling walker (2 wheels) Gait Pattern/deviations: Step-through pattern, Decreased stride length Gait velocity: functional     General Gait Details: step through gait pattern with RW, minimal bil foot clearance, appears symmetrical step length and stance time, no overt LOB or near falls  Stairs            Wheelchair Mobility     Tilt Bed    Modified Rankin (Stroke Patients Only)       Balance Overall balance assessment: Mild deficits observed, not formally tested, History of Falls                                           Pertinent Vitals/Pain Pain Assessment Pain Assessment: Faces Faces Pain Scale: Hurts little more Pain Location: RLE wound Pain Descriptors / Indicators: Discomfort Pain Intervention(s): Limited activity within patient's tolerance, Monitored during session, Repositioned    Home Living Family/patient expects to be discharged to:: Private residence Living Arrangements: Spouse/significant other Available Help at Discharge: Family;Available 24 hours/day Type of Home: House Home  Access: Stairs to enter Entrance Stairs-Rails: Doctor, General Practice of Steps: 3 Alternate Level Stairs-Number of Steps: stair lift but pt reports she climbs the stairs Home Layout: Two level;Full bath on main level;Able to live on main level with bedroom/bathroom Home Equipment: Shower seat - built in;Cane - single point;Cane -  Programmer, [employer] (2 wheels);Rollator (4 wheels) Additional Comments: spouse unable to assist pt    Prior Function Prior Level of Function : Independent/Modified Independent             Mobility Comments: pt reports ind with SPC or rollator in the home, using SPC in community ADLs Comments: pt reports ind with ADLs/IADLs, has cleaning service for house chores     Extremity/Trunk Assessment   Upper Extremity Assessment Upper Extremity Assessment: Defer to OT evaluation    Lower Extremity Assessment Lower Extremity Assessment: RLE deficits/detail;LLE deficits/detail RLE Deficits / Details: dressing in tact over anterior lower leg, good sensation throughout, AROM WFL, strength grossly 3+/5 RLE Sensation: WNL RLE Coordination: WNL LLE Deficits / Details: AROM WFL, strength grossly 3+/5 LLE Sensation: WNL LLE Coordination: WNL    Cervical / Trunk Assessment Cervical / Trunk Assessment: Normal  Communication   Communication Communication: No apparent difficulties    Cognition Arousal: Alert Behavior During Therapy: WFL for tasks assessed/performed   PT - Cognitive impairments: No apparent impairments                         Following commands: Intact       Cueing       General Comments      Exercises     Assessment/Plan    PT Assessment Patient needs continued PT services  PT Problem List Decreased strength;Decreased activity tolerance;Decreased balance;Decreased cognition;Decreased knowledge of use of DME;Decreased safety awareness;Pain;Decreased skin integrity       PT Treatment Interventions DME instruction;Gait training;Stair training;Functional mobility training;Therapeutic activities;Therapeutic exercise;Balance training;Neuromuscular re-education;Patient/family education    PT Goals (Current goals can be found in the Care Plan section)  Acute Rehab PT Goals Patient Stated Goal: return home, agreeable to HHPT PT Goal Formulation: With  patient Time For Goal Achievement: 08/23/24 Potential to Achieve Goals: Good    Frequency Min 3X/week     Co-evaluation               AM-PAC PT 6 Clicks Mobility  Outcome Measure Help needed turning from your back to your side while in a flat bed without using bedrails?: A Little Help needed moving from lying on your back to sitting on the side of a flat bed without using bedrails?: A Little Help needed moving to and from a bed to a chair (including a wheelchair)?: A Little Help needed standing up from a chair using your arms (e.g., wheelchair or bedside chair)?: A Little Help needed to walk in hospital room?: A Little Help needed climbing 3-5 steps with a railing? : A Lot 6 Click Score: 17    End of Session Equipment Utilized During Treatment: Gait belt Activity Tolerance: Patient tolerated treatment well Patient left: in chair;with call bell/phone within reach;with family/visitor present Nurse Communication: Mobility status PT Visit Diagnosis: Muscle weakness (generalized) (M62.81);Pain;History of falling (Z91.81) Pain - Right/Left: Right Pain - part of body: Leg    Time: 8441-8378 PT Time Calculation (min) (ACUTE ONLY): 23 min   Charges:   PT Evaluation $PT Eval Low Complexity: 1 Low   PT General Charges $$ ACUTE PT VISIT: 1 Visit  Metta Ave PT, DPT 08/09/24, 4:51 PM

## 2024-08-09 NOTE — H&P (Signed)
 History and Physical    [name] [name] FMW:[id number] DOB: April 01, 1936 DOA: 08/08/2024  DOS: the patient was seen and examined on 08/08/2024  PCP: Charlott Dorn LABOR, MD   Patient coming from: Home  I have personally briefly reviewed patient's old medical records in [facility] and CareEverywhere  HPI:  [name] [name] is a 88 y.o. year old female with past medical history of pleat heart block status post personal pacemaker in 2016, degenerative disc disease, mild aortic stenosis, and hypertension.  She presents to [facility] ED with right lower extremity edema, erythema, and pain after she sustained a fall in her house on 07/16/2024, where she scratched her leg on the platform of her 4 pronged cane.  She sought care with her PCP a few days later and was put on course of keflex  which she completed.  Since that time she has had increased swelling, redness, pain, and some fatigue.  She was seen in clinic yesterday for follow-up and sent here for admission and IV abx.  She denies fever or chills.    ED Course: On arrival to [facility] ED patient was noted to be afebrile 36.4 C, BP 149/67, HR 61, RR 15, SpO2 95% on room air.  Labs notable for alkaline phosphatase of 296 with normal AST ALT.  Plain film of right tib-fib obtained and shows diffuse soft tissue swelling and edema of the lower extremity as well as an unchanged 6.3 x 3.5 x 4.4 cm subcutaneous soft tissue density of the proximal calf.  She was given cefepime and vancomycin . TRH contacted for admission.    Review of Systems: As mentioned in the history of present illness. All other systems reviewed and are negative.  Review of Systems  Constitutional:  Positive for malaise/fatigue. Negative for chills and fever.  Cardiovascular:  Positive for leg swelling. Negative for chest pain and palpitations.  Gastrointestinal:  Negative for abdominal pain, nausea and vomiting.  Genitourinary:  Negative for dysuria.  Musculoskeletal:   Negative for myalgias.  Neurological:  Positive for weakness. Negative for dizziness, focal weakness and headaches.  All other systems reviewed and are negative.   Past Medical History:  Diagnosis Date   Anxiety    Broken arm Right   Broken leg    Complete heart block (HCC)    Hypertension    Postural dizziness     Past Surgical History:  Procedure Laterality Date   EP IMPLANTABLE DEVICE N/A 01/16/2015   MDT Adapta L PPM implanted by Dr Kelsie for complete heart block   IR GENERIC HISTORICAL  09/15/2016   IR RADIOLOGIST EVAL & MGMT 09/15/2016 [facility]   IR GENERIC HISTORICAL  09/28/2016   IR KYPHO THORACIC WITH BONE BIOPSY 09/28/2016 Thyra Nash, MD [facility]   IR GENERIC HISTORICAL  10/14/2016   IR RADIOLOGIST EVAL & MGMT 10/14/2016 [facility]   PACEMAKER INSERTION N/A 01/2015     reports that she quit smoking about 37 years ago. Her smoking use included cigarettes. She started smoking about 67 years ago. She has never used smokeless tobacco. She reports current alcohol use. She reports that she does not use drugs.  Allergies  Allergen Reactions   Hctz [Hydrochlorothiazide ] Shortness Of Breath and Other (See Comments)    Very short winded, made her hurt all over    Losartan Other (See Comments)    Skin crawling feelings   Macrolides And Ketolides Other (See Comments)    Pyloric sphincter flareups-unsure of which -mycins   Amlodipine   Other (See Comments)   Benazepril Other (See Comments)    Doesn't work for patient   Penicillins Other (See Comments)    abd pain Has patient had a PCN reaction causing immediate rash, facial/tongue/throat swelling, SOB or lightheadedness with hypotension: No Has patient had a PCN reaction causing severe rash involving mucus membranes or skin necrosis: No Has patient had a PCN reaction that required hospitalization No Has patient had a PCN reaction occurring within the last 10 years: No If all of the above answers are NO, then  may proceed with Cephalosporin use.      Family History  Problem Relation Age of Onset   Heart failure Mother    Hypertension Mother    Hypertension Father    Atrial fibrillation Father     Prior to Admission medications   Medication Sig Start Date End Date Taking? Authorizing Provider  acetaminophen  (TYLENOL ) 500 MG tablet Take 500 mg by mouth every 6 (six) hours as needed for mild pain.   Yes [provider]  ALPRAZolam  (XANAX ) 0.25 MG tablet Take 0.125 tablets by mouth daily as needed for anxiety.  02/04/15  Yes [provider]  aspirin  81 MG tablet Take 81 mg by mouth daily.   Yes [provider]  aspirin  EC 325 MG tablet Take 325 mg by mouth daily as needed.   Yes [provider]  Besifloxacin HCl 0.6 % SUSP Apply 1 drop to eye 4 (four) times daily. For 2 days after injection   Yes [provider]  calcium  carbonate (TUMS EX) 750 MG chewable tablet Chew 1 tablet by mouth 2 (two) times daily as needed.    Yes [provider]  Cholecalciferol  (VITAMIN D3) 2000 units TABS Take 1 tablet by mouth daily.   Yes [provider]  famotidine  (PEPCID ) 20 MG tablet Take 10-20 mg by mouth at bedtime.   Yes [provider]  faricimab -svoa (VABYSMO ) 6 MG/0.05ML SOLN intravitreal injection 6 mg by Intravitreal route every 5 (five) weeks.   Yes [provider]  fluticasone  (FLONASE ) 50 MCG/ACT nasal spray Place 2 sprays into both nostrils daily as needed for allergies.  12/27/14  Yes [provider]  hydrALAZINE  (APRESOLINE ) 10 MG tablet Take 1 tablet (10 mg total) by mouth in the morning and at bedtime. May take an additional dose of 10 mg Hydralazine  if blood pressure above 170 systolic or hold if blood pressure is less than 100 systolic 02/02/24  Yes Hochrein, Lynwood, MD  lidocaine (LIDODERM) 5 % Place 1 patch onto the skin daily as needed. Remove & Discard patch within 12 hours or as directed by MD   Yes [provider]  Magnesium  Hydroxide (PHILLIPS MILK OF MAGNESIA PO) Take 1-2 tablets by mouth 2 (two) times daily as needed.   Yes [provider]  Multiple Vitamins-Minerals (PRESERVISION AREDS) CAPS Take 1 capsule by mouth in the morning and at bedtime.   Yes [provider]    Physical Exam: Vitals:   08/09/24 0702 08/09/24 0800 08/09/24 0850 08/09/24 0858  BP:  (!) 157/95 (!) 176/82   Pulse:  67 68   Resp:  18 17   Temp: 97.9 F (36.6 C)     TempSrc: Oral     SpO2:  95% 98%   Weight:    85.7 kg  Height:    5' 4 (1.626 m)    Physical Exam Vitals and nursing note reviewed.  HENT:     Head: Normocephalic.  Eyes:     Pupils: Pupils are equal, round, and reactive to light.  Cardiovascular:     Rate and Rhythm: Normal rate and regular rhythm.     Pulses: Normal pulses.     Heart sounds: Murmur heard.  Pulmonary:     Effort: Pulmonary effort is normal.     Breath sounds: Normal breath sounds.  Abdominal:     Palpations: Abdomen is soft.  Musculoskeletal:        General: Swelling and tenderness present.     Right lower leg: Edema present.  Skin:    General: Skin is warm and dry.     Capillary Refill: Capillary refill takes less than 2 seconds.  Neurological:     General: No focal deficit present.     Mental Status: She is alert and oriented to person, place, and time.  Psychiatric:        Mood and Affect: Mood normal.        Behavior: Behavior normal.      Labs on Admission: I have personally reviewed following labs and imaging studies  CBC: Recent Labs  Lab 08/08/24 1946  WBC 5.1  NEUTROABS 3.6  HGB 12.9  HCT 40.2  MCV 91.2  PLT 318   Basic Metabolic Panel: Recent Labs  Lab 08/08/24 1946  NA 138  K 4.1  CL 102  CO2 25  GLUCOSE 107*  BUN 16  CREATININE 0.86  CALCIUM  9.6   GFR: Estimated Creatinine Clearance: 49.7 mL/min (by C-G formula based on SCr of 0.86 mg/dL). Liver Function Tests: Recent Labs  Lab 08/08/24 1946  AST  33  ALT 21  ALKPHOS 296*  BILITOT 0.4  PROT 7.5  ALBUMIN 4.0   No results for input(s): LIPASE, AMYLASE in the last 168 hours. No results for input(s): AMMONIA in the last 168 hours. Coagulation Profile: No results for input(s): INR, PROTIME in the last 168 hours. Cardiac Enzymes: No results for input(s): CKTOTAL, CKMB, CKMBINDEX, TROPONINI, TROPONINIHS in the last 168 hours. BNP (last 3 results) No results for input(s): BNP in the last 8760 hours. HbA1C: No results for input(s): HGBA1C in the last 72 hours. CBG: No results for input(s): GLUCAP in the last 168 hours. Lipid Profile: No results for input(s): CHOL, HDL, LDLCALC, TRIG, CHOLHDL, LDLDIRECT in the last 72 hours. Thyroid Function Tests: No results for input(s): TSH, T4TOTAL, FREET4, T3FREE, THYROIDAB in the last 72 hours. Anemia Panel: No results for input(s): VITAMINB12, FOLATE, FERRITIN, TIBC, IRON, RETICCTPCT in the last 72 hours. Urine analysis:    Component Value Date/Time   COLORURINE YELLOW 09/10/2020 1550   APPEARANCEUR HAZY (A) 09/10/2020 1550   LABSPEC 1.016 09/10/2020 1550   PHURINE 6.0 09/10/2020 1550   GLUCOSEU NEGATIVE 09/10/2020 1550   HGBUR NEGATIVE 09/10/2020 1550   BILIRUBINUR NEGATIVE 09/10/2020 1550   KETONESUR NEGATIVE 09/10/2020 1550   PROTEINUR NEGATIVE 09/10/2020 1550   UROBILINOGEN 0.2 02/02/2015 1920   NITRITE NEGATIVE 09/10/2020 1550   LEUKOCYTESUR NEGATIVE 09/10/2020 1550    Radiological Exams on Admission: I have personally reviewed images DG Tibia/Fibula Right Port Result Date: 08/08/2024 CLINICAL DATA:  Cellulitis EXAM: PORTABLE RIGHT TIBIA AND FIBULA - 2 VIEW COMPARISON:  Right tibia and fibula x-ray 07/22/2024 FINDINGS: The bones are osteopenic. There is no acute fracture, dislocation or focal osseous lesion. Joint spaces are maintained. There is diffuse soft tissue swelling and edema of the lower extremity. There is a  oval 6.3 x 3.5 by 4.4 cm subcutaneous soft tissue density  of the proximal medial left calf. This is unchanged from the prior study. IMPRESSION: 1. No acute fracture or dislocation. 2. Diffuse soft tissue swelling and edema of the lower extremity. 3. Unchanged oval 6.3 x 3.5 x 4.4 cm subcutaneous soft tissue density of the proximal medial left calf. Please correlate clinically. Electronically Signed   By: Greig Pique M.D.   On: 08/08/2024 23:13     Assessment/Plan Principal Problem:   Cellulitis of right lower extremity Active Problems:   Hypertension   Nonrheumatic aortic valve stenosis    Assessment and Plan: * Cellulitis of right lower extremity - IV Rocephin  - Follow up blood cultures - US  DVT (R)LE to rule out DVT  - Follow daily CBC, BMP    Hypertension - Continue home hydralazine  10 mg twice daily  Nonrheumatic aortic valve stenosis - Continue home daily baby aspirin     VTE prophylaxis:  Lovenox   GI prophylaxis: Protonix  Diet: Heart healthy Access: PIV Lines: None Code Status:  DNR with Intubation Telemetry: Yes  Admission status: Inpatient, Telemetry bed Patient is from: Home Anticipated d/c is to: Home Anticipated d/c date is: 1 to 2 days Patient currently: Receiving IV antibiotics   Family Communication: Patient declined by offer to update family stating husband is hard of hearing and she will update him herself.  Consults called: N/A    Severity of Illness: The appropriate patient status for this patient is INPATIENT. Inpatient status is judged to be reasonable and necessary in order to provide the required intensity of service to ensure the patient's safety. The patient's presenting symptoms, physical exam findings, and initial radiographic and laboratory data in the context of their chronic comorbidities is felt to place them at high risk for further clinical deterioration. Furthermore, it is not anticipated that the patient will be medically stable for  discharge from the hospital within 2 midnights of admission.   * I certify that at the point of admission it is my clinical judgment that the patient will require inpatient hospital care spanning beyond 2 midnights from the point of admission due to high intensity of service, high risk for further deterioration and high frequency of surveillance required.*  To reach the provider On-Call:   7AM- 7PM see care teams to locate the attending and reach out to them via [email or web address] Password: [email or web address] 7PM-7AM contact night-coverage If you still have difficulty reaching the appropriate provider, please page the [facility] (Director on Call) for [facility] on amion for assistance  This document was prepared using Conservation officer, historic buildings and may include unintentional dictation errors.  Rockie Rams FNP-BC, PMHNP-BC Nurse Practitioner [facility]

## 2024-08-10 ENCOUNTER — Inpatient Hospital Stay (HOSPITAL_COMMUNITY)

## 2024-08-10 DIAGNOSIS — R600 Localized edema: Secondary | ICD-10-CM | POA: Diagnosis not present

## 2024-08-10 DIAGNOSIS — L03115 Cellulitis of right lower limb: Secondary | ICD-10-CM | POA: Diagnosis not present

## 2024-08-10 DIAGNOSIS — M1711 Unilateral primary osteoarthritis, right knee: Secondary | ICD-10-CM | POA: Diagnosis not present

## 2024-08-10 LAB — BASIC METABOLIC PANEL WITH GFR
Anion gap: 9 (ref 5–15)
BUN: 19 mg/dL (ref 8–23)
CO2: 26 mmol/L (ref 22–32)
Calcium: 9.8 mg/dL (ref 8.9–10.3)
Chloride: 105 mmol/L (ref 98–111)
Creatinine, Ser: 0.96 mg/dL (ref 0.44–1.00)
GFR, Estimated: 57 mL/min — ABNORMAL LOW (ref >60)
Glucose, Bld: 85 mg/dL (ref 70–99)
Potassium: 4.3 mmol/L (ref 3.5–5.1)
Sodium: 140 mmol/L (ref 135–145)

## 2024-08-10 LAB — CBC
HCT: 35.6 % — ABNORMAL LOW (ref 36.0–46.0)
Hemoglobin: 11.2 g/dL — ABNORMAL LOW (ref 12.0–15.0)
MCH: 28.9 pg (ref 26.0–34.0)
MCHC: 31.5 g/dL (ref 30.0–36.0)
MCV: 92 fL (ref 80.0–100.0)
Platelets: 288 K/uL (ref 150–400)
RBC: 3.87 MIL/uL (ref 3.87–5.11)
RDW: 14.8 % (ref 11.5–15.5)
WBC: 4.7 K/uL (ref 4.0–10.5)
nRBC: 0 % (ref 0.0–0.2)

## 2024-08-10 MED ORDER — POLYVINYL ALCOHOL 1.4 % OP SOLN
1.0000 [drp] | OPHTHALMIC | Status: DC | PRN
  Administered 2024-08-10: 1 [drp] via OPHTHALMIC
  Filled 2024-08-10: qty 15

## 2024-08-10 MED ORDER — IOHEXOL 300 MG/ML  SOLN
100.0000 mL | Freq: Once | INTRAMUSCULAR | Status: AC | PRN
  Administered 2024-08-10: 100 mL via INTRAVENOUS

## 2024-08-10 NOTE — Plan of Care (Signed)

## 2024-08-10 NOTE — Progress Notes (Signed)
 Chaplains received a consult to provide [name] [name] with information on advance directives. I provided her with paperwork and education on the documents. She wants to assign her son, [name] [name], who lives in [place] to be her primary.  We discussed the fact that currently her husband would be her next of kin.  She is comfortable with him making decisions in the meantime if any need should arise.  She wants to look over the document and speak with her sons before filling it out.  No other needs were expressed at this time, but please page if needs arise or if she has questions about the documents.

## 2024-08-10 NOTE — Evaluation (Signed)
 Occupational Therapy Evaluation Patient Details Name: [name] [name] MRN: [id number] DOB: 08-02-1936 Today's Date: 08/10/2024   History of Present Illness   [name] [name] is an 88 y.o. female admitted with RLE cellulitis. PMH: pleat heart block status post personal pacemaker in 2016, degenerative disc disease, mild aortic stenosis, and hypertension     Clinical Impressions Pt c/o 2/10 pain to RLE, swelling/redness present. Pt lives with husband, who is unable to provide physical assist. PLOF independent, occasional use of cane. Pt currently close to baseline, able to complete ADLs mod I in room, supervision for safety with ambulation, RW for support due to pain. Pt has all DME at home needed to remain safe, recommending HHOT follow up to maximize safety and independence with ADLs at home, no further acute OT needs.      If plan is discharge home, recommend the following:   Assist for transportation;Help with stairs or ramp for entrance     Functional Status Assessment   Patient has had a recent decline in their functional status and demonstrates the ability to make significant improvements in function in a reasonable and predictable amount of time.     Equipment Recommendations   None recommended by OT     Recommendations for Other Services         Precautions/Restrictions   Precautions Precautions: Fall Recall of Precautions/Restrictions: Intact Precaution/Restrictions Comments: RLE wound Restrictions Weight Bearing Restrictions Per Provider Order: No     Mobility Bed Mobility               General bed mobility comments: in recliner    Transfers Overall transfer level: Needs assistance   Transfers: Sit to/from Stand Sit to Stand: Supervision           General transfer comment: supervision for safety with cane/RW      Balance Overall balance assessment: Mild deficits observed, not formally tested                                          ADL either performed or assessed with clinical judgement   ADL Overall ADL's : At baseline;Needs assistance/impaired                                       General ADL Comments: Pt overall mod I for ADLs, likely at baseline, supervision for ambulation with cane or RW for support.     Vision Baseline Vision/History: 1 Wears glasses Ability to See in Adequate Light: 0 Adequate Patient Visual Report: No change from baseline       Perception         Praxis         Pertinent Vitals/Pain Pain Assessment Pain Assessment: 0-10 Pain Score: 2  Pain Location: RLE wound Pain Descriptors / Indicators: Discomfort Pain Intervention(s): Monitored during session     Extremity/Trunk Assessment Upper Extremity Assessment Upper Extremity Assessment: Overall WFL for tasks assessed;RUE deficits/detail RUE Deficits / Details: constant mild numbness to digits 1-3 for last year, still WFLs RUE Sensation: decreased light touch RUE Coordination: WNL   Lower Extremity Assessment Lower Extremity Assessment: Defer to PT evaluation       Communication Communication Communication: No apparent difficulties   Cognition Arousal: Alert Behavior During Therapy: WFL for tasks assessed/performed Cognition: No apparent impairments  Following commands: Intact       Cueing  General Comments   Cueing Techniques: Verbal cues      Exercises     Shoulder Instructions      Home Living Family/patient expects to be discharged to:: Private residence Living Arrangements: Spouse/significant other Available Help at Discharge: Family;Available 24 hours/day Type of Home: House Home Access: Stairs to enter [employer] of Steps: 3 Entrance Stairs-Rails: Right;Left Home Layout: Two level;Full bath on main level;Able to live on main level with bedroom/bathroom Alternate Level Stairs-Number of Steps: stair lift but pt  reports she climbs the stairs   Bathroom Shower/Tub: Walk-in shower         Home Equipment: Shower seat - built in;Cane - single point;Cane - [employer] (2 wheels);Rollator (4 wheels)   Additional Comments: Pt lives with husband who is unable to assist pt. Has someone come over to do light cleaning.      Prior Functioning/Environment Prior Level of Function : Independent/Modified Independent             Mobility Comments: ind, occasional use of cane, one fall 07/16/24 ADLs Comments: pt reports ind with ADLs/IADLs, has cleaning service for house chores    OT Problem List: Decreased strength;Decreased range of motion;Decreased activity tolerance;Impaired balance (sitting and/or standing);Pain;Increased edema   OT Treatment/Interventions:        OT Goals(Current goals can be found in the care plan section)   Acute Rehab OT Goals Patient Stated Goal: to return home OT Goal Formulation: With patient Time For Goal Achievement: 08/24/24 Potential to Achieve Goals: Good   OT Frequency:       Co-evaluation              AM-PAC OT 6 Clicks Daily Activity     Outcome Measure Help from another person eating meals?: None Help from another person taking care of personal grooming?: None Help from another person toileting, which includes using toliet, bedpan, or urinal?: None Help from another person bathing (including washing, rinsing, drying)?: None Help from another person to put on and taking off regular upper body clothing?: None Help from another person to put on and taking off regular lower body clothing?: None 6 Click Score: 24   End of Session Equipment Utilized During Treatment: Gait belt;Rolling walker (2 wheels) Nurse Communication: Mobility status  Activity Tolerance: Patient tolerated treatment well Patient left: in bed;with call bell/phone within reach  OT Visit Diagnosis: Unsteadiness on feet (R26.81);Other abnormalities of gait and mobility  (R26.89);Muscle weakness (generalized) (M62.81);Pain Pain - Right/Left: Right Pain - part of body: Leg                Time: 8740-8667 OT Time Calculation (min): 33 min Charges:  OT General Charges $OT Visit: 1 Visit OT Evaluation $OT Eval Low Complexity: 1 Low OT Treatments $Self Care/Home Management : 8-22 mins  [place], OTR/L   Elouise JONELLE Bott 08/10/2024, 1:41 PM

## 2024-08-10 NOTE — Progress Notes (Signed)
 [facility] PROGRESS NOTE    Progress Note  [name] [name]  FMW:[id number] DOB: Feb 22, 1936 DOA: 08/08/2024 PCP: Charlott Dorn LABOR, MD     Brief Narrative:   [name] [name] is an 88 y.o. female past medical history of complete heart block status post pacemaker placement 2016, mild aortic stenosis, essential hypertension comes into us  along for the right lower extremity edema and pain after a sustained fall on 07/16/2024.  She saw her primary care doctor who prescribed Keflex , she relates that her swelling and redness has progressed.  Seen in the clinic the day prior to admission was started on empiric IV antibiotics    Assessment/Plan:   Cellulitis of right lower extremity She was started empirically on Rocephin  and vancomycin . Due to the amount of swelling lower extremity Doppler showed no evidence of DVT. She developed a significant hematoma, Ortho was consulted to see if I&D is possible.  Essential hypertension: Continue hydralazine  twice a day.  Nonrheumatic aortic valve stenosis Continue aspirin  twice a day   DVT prophylaxis: lovenox  Family Communication:none Status is: Inpatient Remains inpatient appropriate because: Right lower extremity cellulitis secondary to trauma    Code Status:     Code Status Orders  (From admission, onward)           Start     Ordered   08/09/24 0850  Do not attempt resuscitation (DNR) Pre-Arrest Interventions Desired  (Code Status)  Continuous       Question Answer Comment  If pulseless and not breathing No CPR or chest compressions.   In Pre-Arrest Conditions (Patient Has Pulse and Is Breathing) May intubate, use advanced airway interventions and cardioversion/ACLS medications if appropriate or indicated. May transfer to ICU.   Consent: Discussion documented in EHR or advanced directives reviewed      08/09/24 0849           Code Status History     Date Active Date Inactive Code Status Order ID Comments User  Context   08/09/2024 0803 08/09/2024 0849 Full Code [id number]  Jurline Rockie CROME, NP ED   08/11/2015 2214 08/13/2015 1957 Full Code [id number]  Starleen Meter, MD ED   01/16/2015 2000 01/18/2015 2118 Full Code [id number]  Kelsie Agent, MD Inpatient   01/14/2015 2124 01/16/2015 2000 Full Code [id number]  Tobie Yetta HERO, MD Inpatient         IV Access:   Peripheral IV   Procedures and diagnostic studies:   VAS US  LOWER EXTREMITY VENOUS (DVT) Result Date: 08/09/2024  Lower Venous DVT Study Patient Name:  [name] [name]  Date of Exam:   08/09/2024 Medical Rec #: [id number]       Accession #:    [id number] Date of Birth: May 18, 1936        Patient Gender: F Patient Age:   89 years Exam Location:  [facility] Procedure:      VAS US  LOWER EXTREMITY VENOUS (DVT) Referring Phys: ROCKIE FOUST --------------------------------------------------------------------------------  Indications: Swelling, Erythema, Edema, Pain, and Patient stated that she fell on 07/16/24 where she scratched her leg on the plateform of her cane.  Limitations: Body habitus, poor ultrasound/tissue interface and open wound. Comparison Study: No priors. Performing Technologist: Ricka Sturdivant-Jones RDMS, RVT  Examination Guidelines: A complete evaluation includes B-mode imaging, spectral Doppler, color Doppler, and power Doppler as needed of all accessible portions of each vessel. Bilateral testing is considered an integral part of a complete examination. Limited examinations for reoccurring indications may be performed as noted. The reflux portion of the  exam is performed with the patient in reverse Trendelenburg.  +---------+---------------+---------+-----------+----------+--------------+ RIGHT    CompressibilityPhasicitySpontaneityPropertiesThrombus Aging +---------+---------------+---------+-----------+----------+--------------+ CFV      Full           Yes      Yes                                  +---------+---------------+---------+-----------+----------+--------------+ SFJ      Full                                                        +---------+---------------+---------+-----------+----------+--------------+ FV Prox  Full                                                        +---------+---------------+---------+-----------+----------+--------------+ FV Mid   Full           Yes      Yes                                 +---------+---------------+---------+-----------+----------+--------------+ FV DistalFull                                                        +---------+---------------+---------+-----------+----------+--------------+ PFV      Full                                                        +---------+---------------+---------+-----------+----------+--------------+ POP      Full           Yes      Yes                                 +---------+---------------+---------+-----------+----------+--------------+ PTV      Full                                                        +---------+---------------+---------+-----------+----------+--------------+ PERO     Full                                                        +---------+---------------+---------+-----------+----------+--------------+   +----+---------------+---------+-----------+----------+--------------+ LEFTCompressibilityPhasicitySpontaneityPropertiesThrombus Aging +----+---------------+---------+-----------+----------+--------------+ CFV Full           Yes      Yes                                 +----+---------------+---------+-----------+----------+--------------+  SFJ Full                                                        +----+---------------+---------+-----------+----------+--------------+   Summary: RIGHT: - There is no evidence of deep vein thrombosis in the lower extremity.  - No cystic structure found in the popliteal fossa. - A large  anechoic/cystic structure located in the proximal medial calf noted.  LEFT: - No evidence of common femoral vein obstruction.   *See table(s) above for measurements and observations. Electronically signed by Debby Robertson on 08/09/2024 at 2:18:39 PM.    Final    DG Tibia/Fibula Right Port Result Date: 08/08/2024 CLINICAL DATA:  Cellulitis EXAM: PORTABLE RIGHT TIBIA AND FIBULA - 2 VIEW COMPARISON:  Right tibia and fibula x-ray 07/22/2024 FINDINGS: The bones are osteopenic. There is no acute fracture, dislocation or focal osseous lesion. Joint spaces are maintained. There is diffuse soft tissue swelling and edema of the lower extremity. There is a oval 6.3 x 3.5 by 4.4 cm subcutaneous soft tissue density of the proximal medial left calf. This is unchanged from the prior study. IMPRESSION: 1. No acute fracture or dislocation. 2. Diffuse soft tissue swelling and edema of the lower extremity. 3. Unchanged oval 6.3 x 3.5 x 4.4 cm subcutaneous soft tissue density of the proximal medial left calf. Please correlate clinically. Electronically Signed   By: Greig Pique M.D.   On: 08/08/2024 23:13     Medical Consultants:   None.   Subjective:    [name] [name] no complaints  Objective:    Vitals:   08/09/24 1346 08/09/24 1820 08/09/24 2022 08/10/24 0519  BP: 136/63 128/77 (!) 159/68 (!) 169/78  Pulse: 70 69 74 68  Resp:  18 15 15   Temp:  98.6 F (37 C) 97.7 F (36.5 C) 97.8 F (36.6 C)  TempSrc:  Oral Oral Oral  SpO2: 98% 99% 97% 95%  Weight:      Height:       SpO2: 95 %   Intake/Output Summary (Last 24 hours) at 08/10/2024 0655 Last data filed at 08/10/2024 0541 Gross per 24 hour  Intake 1030 ml  Output 1200 ml  Net -170 ml   Filed Weights   08/09/24 0858  Weight: 85.7 kg    Exam: General exam: In no acute distress. Respiratory system: Good air movement and clear to auscultation. Cardiovascular system: S1 & S2 heard, RRR. No JVD. Gastrointestinal system: Abdomen is  nondistended, soft and nontender.  Extremities: No pedal edema. Skin: No rashes, lesions or ulcers Psychiatry: Judgement and insight appear normal. Mood & affect appropriate.    Data Reviewed:    Labs: Basic Metabolic Panel: Recent Labs  Lab 08/08/24 1946 08/09/24 0851 08/10/24 0513  NA 138 137 140  K 4.1 4.3 4.3  CL 102 102 105  CO2 25 25 26   GLUCOSE 107* 90 85  BUN 16 13 19   CREATININE 0.86 0.75 0.96  CALCIUM  9.6 9.6 9.8   GFR Estimated Creatinine Clearance: 44.6 mL/min (by C-G formula based on SCr of 0.96 mg/dL). Liver Function Tests: Recent Labs  Lab 08/08/24 1946  AST 33  ALT 21  ALKPHOS 296*  BILITOT 0.4  PROT 7.5  ALBUMIN 4.0   No results for input(s): LIPASE, AMYLASE in the last 168 hours. No results for input(s): AMMONIA in  the last 168 hours. Coagulation profile No results for input(s): INR, PROTIME in the last 168 hours. COVID-19 Labs  No results for input(s): DDIMER, FERRITIN, LDH, CRP in the last 72 hours.  No results found for: SARSCOV2NAA  CBC: Recent Labs  Lab 08/08/24 1946 08/09/24 0851 08/10/24 0513  WBC 5.1 5.2 4.7  NEUTROABS 3.6  --   --   HGB 12.9 12.0 11.2*  HCT 40.2 36.8 35.6*  MCV 91.2 92.2 92.0  PLT 318 287 288   Cardiac Enzymes: No results for input(s): CKTOTAL, CKMB, CKMBINDEX, TROPONINI in the last 168 hours. BNP (last 3 results) No results for input(s): PROBNP in the last 8760 hours. CBG: No results for input(s): GLUCAP in the last 168 hours. D-Dimer: No results for input(s): DDIMER in the last 72 hours. Hgb A1c: No results for input(s): HGBA1C in the last 72 hours. Lipid Profile: No results for input(s): CHOL, HDL, LDLCALC, TRIG, CHOLHDL, LDLDIRECT in the last 72 hours. Thyroid function studies: No results for input(s): TSH, T4TOTAL, T3FREE, THYROIDAB in the last 72 hours.  Invalid input(s): FREET3 Anemia work up: No results for input(s): VITAMINB12,  FOLATE, FERRITIN, TIBC, IRON, RETICCTPCT in the last 72 hours. Sepsis Labs: Recent Labs  Lab 08/08/24 1946 08/08/24 1956 08/09/24 0851 08/10/24 0513  WBC 5.1  --  5.2 4.7  LATICACIDVEN  --  0.8  --   --    Microbiology Recent Results (from the past 240 hours)  Blood culture (routine x 2)     Status: None (Preliminary result)   Collection Time: 08/08/24 11:24 PM   Specimen: BLOOD RIGHT FOREARM  Result Value Ref Range Status   Specimen Description   Final    BLOOD RIGHT FOREARM Performed at [facility], [address], [place], [place] [address]    Special Requests   Final    BOTTLES DRAWN AEROBIC AND ANAEROBIC Blood Culture results may not be optimal due to an inadequate volume of blood received in culture bottles Performed at [facility], [address] [place], [place] [address]    Culture   Final    NO GROWTH < 12 HOURS Performed at [facility], [address], [place], [place] [address]    Report Status PENDING  Incomplete     Medications:    aspirin  EC  81 mg Oral Daily   enoxaparin  (LOVENOX ) injection  40 mg Subcutaneous Q24H   hydrALAZINE   10 mg Oral BID   pantoprazole   40 mg Oral Daily   senna-docusate  2 tablet Oral QHS   Continuous Infusions:  cefTRIAXone  (ROCEPHIN )  IV 2 g (08/09/24 1220)   vancomycin         LOS: 1 day   Erle Odell Castor  [facility]  08/10/2024, 6:55 AM

## 2024-08-10 NOTE — Consult Note (Signed)
 Brief orthopedic consult note:  I was contacted by Dr. Laurence for this patient.  She is a 88 year old female admitted for presumed cellulitis of the right lower extremity.  There is concern for hematoma in her leg.  Ultrasound negative for DVT.  I reviewed the clinical pictures which demonstrate redness to her leg and overlying skin abrasion.  X-rays of the leg demonstrate a radiodense soft tissue mass along the medial leg in the area of concern that was present on imaging 1 month ago but was not present on imaging of the leg obtained before that.  I requested an MRI scan however the patient has a pacemaker.  CT scan with contrast has been ordered to evaluate the soft tissue mass.  I am not convinced that this is a simple hematoma or even an abscess due to the x-ray findings.  We will await CT findings and make more definitive recommendations.  No plan for surgery currently.  I did reach out to Dr. Harden to review the imaging as well and he agrees that there is enough concern on radiographs to obtain advanced imaging in attempt to better understand the soft tissue mass.  She has a normal white blood cell count.  I will await CT results.

## 2024-08-11 DIAGNOSIS — L03115 Cellulitis of right lower limb: Secondary | ICD-10-CM | POA: Diagnosis not present

## 2024-08-11 LAB — SEDIMENTATION RATE: Sed Rate: 28 mm/h — ABNORMAL HIGH (ref 0–22)

## 2024-08-11 MED ORDER — VANCOMYCIN HCL 750 MG/150ML IV SOLN
750.0000 mg | INTRAVENOUS | Status: DC
  Administered 2024-08-12: 750 mg via INTRAVENOUS
  Filled 2024-08-11: qty 150

## 2024-08-11 MED ORDER — POLYETHYLENE GLYCOL 3350 17 G PO PACK
17.0000 g | PACK | Freq: Two times a day (BID) | ORAL | Status: DC
  Administered 2024-08-11: 17 g via ORAL
  Filled 2024-08-11 (×2): qty 1

## 2024-08-11 MED ORDER — HYDRALAZINE HCL 10 MG PO TABS
10.0000 mg | ORAL_TABLET | Freq: Three times a day (TID) | ORAL | Status: DC
  Administered 2024-08-11 – 2024-08-12 (×3): 10 mg via ORAL
  Filled 2024-08-11 (×4): qty 1

## 2024-08-11 MED ORDER — COLLAGENASE 250 UNIT/GM EX OINT
TOPICAL_OINTMENT | Freq: Every day | CUTANEOUS | Status: DC
  Filled 2024-08-11: qty 30

## 2024-08-11 MED ORDER — ASPIRIN 81 MG PO TBEC
81.0000 mg | DELAYED_RELEASE_TABLET | Freq: Every day | ORAL | Status: DC
  Administered 2024-08-11 – 2024-08-12 (×2): 81 mg via ORAL
  Filled 2024-08-11 (×2): qty 1

## 2024-08-11 NOTE — Progress Notes (Addendum)
 [facility] PROGRESS NOTE    Progress Note  [name] [name]  FMW:[id number] DOB: 01-15-1936 DOA: 08/08/2024 PCP: Charlott Dorn LABOR, MD     Brief Narrative:   [name] [name] is an 88 y.o. female past medical history of complete heart block status post pacemaker placement 2016, mild aortic stenosis, essential hypertension comes into us  along for the right lower extremity edema and pain after a sustained fall on 07/16/2024.  She saw her primary care doctor who prescribed Keflex , she relates that her swelling and redness has progressed.  Seen in the clinic the day prior to admission was started on empiric IV antibiotics.  Assessment/Plan:   Cellulitis of right lower extremity She was started empirically on Rocephin  and vancomycin . Due to the amount of swelling lower extremity Doppler showed no evidence of DVT. CT of the tibia showed a complex 11 x 5 x 10 hematoma. I have reached out to orthopedic doctor to reevaluate to see if I&D is needed  Essential hypertension: Continue hydralazine  twice a day.  Nonrheumatic aortic valve stenosis Continue aspirin  twice a day   DVT prophylaxis: lovenox  Family Communication:none Status is: Inpatient Remains inpatient appropriate because: Right lower extremity cellulitis secondary to trauma    Code Status:     Code Status Orders  (From admission, onward)           Start     Ordered   08/09/24 0850  Do not attempt resuscitation (DNR) Pre-Arrest Interventions Desired  (Code Status)  Continuous       Question Answer Comment  If pulseless and not breathing No CPR or chest compressions.   In Pre-Arrest Conditions (Patient Has Pulse and Is Breathing) May intubate, use advanced airway interventions and cardioversion/ACLS medications if appropriate or indicated. May transfer to ICU.   Consent: Discussion documented in EHR or advanced directives reviewed      08/09/24 0849           Code Status History     Date Active Date  Inactive Code Status Order ID Comments User Context   08/09/2024 0803 08/09/2024 0849 Full Code [id number]  Jurline Rockie CROME, NP ED   08/11/2015 2214 08/13/2015 1957 Full Code [id number]  Starleen Meter, MD ED   01/16/2015 2000 01/18/2015 2118 Full Code [id number]  Kelsie Agent, MD Inpatient   01/14/2015 2124 01/16/2015 2000 Full Code [id number]  Tobie Yetta HERO, MD Inpatient         IV Access:   Peripheral IV   Procedures and diagnostic studies:   CT TIBIA FIBULA RIGHT W CONTRAST Result Date: 08/10/2024 EXAM: CT of the right tibia/fibula with IV contrast 08/10/2024 03:44:31 PM TECHNIQUE: Axial images were acquired through the right tibia/fibula with IV contrast. Reformatted images were reviewed. Automated exposure control, iterative reconstruction, and/or weight based adjustment of the mA/kV was utilized to reduce the radiation dose to as low as reasonably achievable. COMPARISON: Radiographs 08/08/2024. CLINICAL HISTORY: Suspected hematoma at the level of the right knee. FINDINGS: BONES AND JOINTS: Osteoarthritis of the right knee without significant knee effusion. No tibial or fibular fracture identified. Small well-corticated ossicle below the medial malleolus. No dislocation. The joint spaces are normal. SOFT TISSUES: Mild but age appropriate atrophy. Mildly complex medial calf subcutaneous fluid collection at the proximal diaphyseal level, with the multilobulated collection measuring about 11.0 x 4.5 x 9.3 cm, with a total volume of 240 ml. No substantial degree of marginal enhancement to indicate superimposed infection; no gas in the collection. Mild stranding in the subcutaneous tissues around  the region likely indicating low-grade inflammation. There is also mild subcutaneous edema lateral to the knee and calf. This is more confluent in the distal calf and ankle region. IMPRESSION: 1. Mildly complex medial calf subcutaneous fluid collection at the proximal diaphyseal level, measuring approximately 11.0 x  4.5 x 9.3 cm (volume 240 mL), without substantial marginal enhancement or gas, consistent with a hematoma; mild surrounding stranding likely represents low-grade inflammation. 2. Mild subcutaneous edema lateral to the knee and calf, more confluent in the distal calf and ankle region. 3. Osteoarthritis of the right knee without significant knee effusion. Electronically signed by: Ryan Salvage MD 08/10/2024 05:04 PM EST RP Workstation: [id number]   VAS US  LOWER EXTREMITY VENOUS (DVT) Result Date: 08/09/2024  Lower Venous DVT Study Patient Name:  [name] [name]  Date of Exam:   08/09/2024 Medical Rec #: [id number]       Accession #:    [id number] Date of Birth: 15-Dec-1935        Patient Gender: F Patient Age:   47 years Exam [facility]  [facility] Procedure:      VAS US  LOWER EXTREMITY VENOUS (DVT) Referring Phys: ROCKIE FOUST --------------------------------------------------------------------------------  Indications: Swelling, Erythema, Edema, Pain, and Patient stated that she fell on 07/16/24 where she scratched her leg on the plateform of her cane.  Limitations: Body habitus, poor ultrasound/tissue interface and open wound. Comparison Study: No priors. Performing Technologist: Ricka Sturdivant-Jones RDMS, RVT  Examination Guidelines: A complete evaluation includes B-mode imaging, spectral Doppler, color Doppler, and power Doppler as needed of all accessible portions of each vessel. Bilateral testing is considered an integral part of a complete examination. Limited examinations for reoccurring indications may be performed as noted. The reflux portion of the exam is performed with the patient in reverse Trendelenburg.  +---------+---------------+---------+-----------+----------+--------------+ RIGHT    CompressibilityPhasicitySpontaneityPropertiesThrombus Aging +---------+---------------+---------+-----------+----------+--------------+ CFV      Full           Yes      Yes                                  +---------+---------------+---------+-----------+----------+--------------+ SFJ      Full                                                        +---------+---------------+---------+-----------+----------+--------------+ FV Prox  Full                                                        +---------+---------------+---------+-----------+----------+--------------+ FV Mid   Full           Yes      Yes                                 +---------+---------------+---------+-----------+----------+--------------+ FV DistalFull                                                        +---------+---------------+---------+-----------+----------+--------------+  PFV      Full                                                        +---------+---------------+---------+-----------+----------+--------------+ POP      Full           Yes      Yes                                 +---------+---------------+---------+-----------+----------+--------------+ PTV      Full                                                        +---------+---------------+---------+-----------+----------+--------------+ PERO     Full                                                        +---------+---------------+---------+-----------+----------+--------------+   +----+---------------+---------+-----------+----------+--------------+ LEFTCompressibilityPhasicitySpontaneityPropertiesThrombus Aging +----+---------------+---------+-----------+----------+--------------+ CFV Full           Yes      Yes                                 +----+---------------+---------+-----------+----------+--------------+ SFJ Full                                                        +----+---------------+---------+-----------+----------+--------------+   Summary: RIGHT: - There is no evidence of deep vein thrombosis in the lower extremity.  - No cystic structure found in the popliteal fossa. - A  large anechoic/cystic structure located in the proximal medial calf noted.  LEFT: - No evidence of common femoral vein obstruction.   *See table(s) above for measurements and observations. Electronically signed by Debby Robertson on 08/09/2024 at 2:18:39 PM.    Final      Medical Consultants:   None.   Subjective:    [name] [name] pain is controlled.  Objective:    Vitals:   08/10/24 1011 08/10/24 1420 08/10/24 2134 08/11/24 0649  BP: (!) 169/78 (!) 155/63 (!) 162/69 (!) 187/81  Pulse:  66 68 64  Resp:  18 17 18   Temp:  97.7 F (36.5 C) 97.8 F (36.6 C) 97.7 F (36.5 C)  TempSrc:  Oral Oral Oral  SpO2:  96% 98% 97%  Weight:      Height:       SpO2: 97 %   Intake/Output Summary (Last 24 hours) at 08/11/2024 0853 Last data filed at 08/11/2024 0600 Gross per 24 hour  Intake 1200 ml  Output 2050 ml  Net -850 ml   Filed Weights   08/09/24 0858  Weight: 85.7 kg    Exam: General exam: In no acute distress. Respiratory system: Good air movement and clear to auscultation.  Cardiovascular system: S1 & S2 heard, RRR. No JVD. Gastrointestinal system: Abdomen is nondistended, soft and nontender.  Extremities: No pedal edema. Skin:  Psychiatry: Judgement and insight appear normal. Mood & affect appropriate.  Data Reviewed:    Labs: Basic Metabolic Panel: Recent Labs  Lab 08/08/24 1946 08/09/24 0851 08/10/24 0513  NA 138 137 140  K 4.1 4.3 4.3  CL 102 102 105  CO2 25 25 26   GLUCOSE 107* 90 85  BUN 16 13 19   CREATININE 0.86 0.75 0.96  CALCIUM  9.6 9.6 9.8   GFR Estimated Creatinine Clearance: 44.6 mL/min (by C-G formula based on SCr of 0.96 mg/dL). Liver Function Tests: Recent Labs  Lab 08/08/24 1946  AST 33  ALT 21  ALKPHOS 296*  BILITOT 0.4  PROT 7.5  ALBUMIN 4.0   No results for input(s): LIPASE, AMYLASE in the last 168 hours. No results for input(s): AMMONIA in the last 168 hours. Coagulation profile No results for input(s): INR,  PROTIME in the last 168 hours. COVID-19 Labs  No results for input(s): DDIMER, FERRITIN, LDH, CRP in the last 72 hours.  No results found for: SARSCOV2NAA  CBC: Recent Labs  Lab 08/08/24 1946 08/09/24 0851 08/10/24 0513  WBC 5.1 5.2 4.7  NEUTROABS 3.6  --   --   HGB 12.9 12.0 11.2*  HCT 40.2 36.8 35.6*  MCV 91.2 92.2 92.0  PLT 318 287 288   Cardiac Enzymes: No results for input(s): CKTOTAL, CKMB, CKMBINDEX, TROPONINI in the last 168 hours. BNP (last 3 results) No results for input(s): PROBNP in the last 8760 hours. CBG: No results for input(s): GLUCAP in the last 168 hours. D-Dimer: No results for input(s): DDIMER in the last 72 hours. Hgb A1c: No results for input(s): HGBA1C in the last 72 hours. Lipid Profile: No results for input(s): CHOL, HDL, LDLCALC, TRIG, CHOLHDL, LDLDIRECT in the last 72 hours. Thyroid function studies: No results for input(s): TSH, T4TOTAL, T3FREE, THYROIDAB in the last 72 hours.  Invalid input(s): FREET3 Anemia work up: No results for input(s): VITAMINB12, FOLATE, FERRITIN, TIBC, IRON, RETICCTPCT in the last 72 hours. Sepsis Labs: Recent Labs  Lab 08/08/24 1946 08/08/24 1956 08/09/24 0851 08/10/24 0513  WBC 5.1  --  5.2 4.7  LATICACIDVEN  --  0.8  --   --    Microbiology Recent Results (from the past 240 hours)  Blood culture (routine x 2)     Status: None (Preliminary result)   Collection Time: 08/08/24 11:24 PM   Specimen: BLOOD RIGHT FOREARM  Result Value Ref Range Status   Specimen Description   Final    BLOOD RIGHT FOREARM Performed at [facility], [address] [place], [place] [address]    Special Requests   Final    BOTTLES DRAWN AEROBIC AND ANAEROBIC Blood Culture results may not be optimal due to an inadequate volume of blood received in culture bottles Performed at [facility], [address], [place], [place] [address]     Culture   Final    NO GROWTH 1 DAY Performed at [facility], [address], [place], [place] [address]    Report Status PENDING  Incomplete  Blood culture (routine x 2)     Status: None (Preliminary result)   Collection Time: 08/09/24 10:48 AM   Specimen: BLOOD  Result Value Ref Range Status   Specimen Description   Final    BLOOD BLOOD LEFT HAND Performed at [facility],  [address], [place], [place] [address]    Special Requests   Final    BOTTLES DRAWN AEROBIC AND ANAEROBIC Blood Culture results may not be optimal due to an inadequate volume of blood received in culture bottles Performed at [facility], [address], [place], [place] [address]    Culture   Final    NO GROWTH < 24 HOURS Performed at [facility], [address], [place], [place] [address]    Report Status PENDING  Incomplete     Medications:    aspirin  EC  81 mg Oral Daily   enoxaparin  (LOVENOX ) injection  40 mg Subcutaneous Q24H   hydrALAZINE   10 mg Oral BID   pantoprazole   40 mg Oral Daily   senna-docusate  2 tablet Oral QHS   Continuous Infusions:  cefTRIAXone  (ROCEPHIN )  IV 2 g (08/10/24 1223)   vancomycin  1,250 mg (08/10/24 1548)      LOS: 2 days   [name] [name]  [facility]  08/11/2024, 8:53 AM

## 2024-08-11 NOTE — Consult Note (Signed)
 WOC Nurse Consult Note: Reason for Consult: right LE wound Wound type: trauma; fall 11/10 Pressure Injury POA: NA Measurement: see nursing flow sheet Wound bed:50% yellow/50% pink Drainage (amount, consistency, odor) see nursing flowsheet Periwound: edema and redness Dressing procedure/placement/frequency: Cleanse LE wound with saline, pat dry Apply Santyl  and cover with single layer of xeroform gauze.   Top with dry dressing Wrap with kerlix and ACE from base of toes to above knee Change daily   Re consult if needed, will not follow at this time. Thanks  Thresa [employer], RN,CWOCN, CNS, [employer] [phone]

## 2024-08-11 NOTE — Plan of Care (Signed)

## 2024-08-11 NOTE — Consult Note (Signed)
 ORTHOPAEDIC CONSULTATION  REQUESTING PHYSICIAN: Odell Celinda Balo, MD  PCP:  Charlott Dorn LABOR, MD  Chief Complaint: right lower extremity swelling and cellulitis  HPI: [name] [name] is a 88 y.o. female who had a fall 3 weeks ago and sustained a large hematoma and skin abrasion.  The patient states that she has had discomfort and has been seeing wound care for the abrasion.  1 week ago she began to have worsening swelling and redness of the leg.  She was then admitted to the hospital and diagnosed with cellulitis of the right lower extremity 3 days ago.  The patient and medical team have noted significant improvements in her swelling and erythema and pain.  They had obtained an x-ray which demonstrated a calcificied mass in the medial soft tissues followed by a CT scan of the extremity for further evaluation.  I am consulted for evaluation of this mass. She has had no fevers or chills. Her past medical history is significant for complete heart block s/p pacemaker placement in 2016, aortic stenosis, HTN, and chronic venous stasis changes of the bilateral lower extremities. She has had no numbness or tingling of the right lower extremity. She is only on aspirin  for blood thinners.  Past Medical History:  Diagnosis Date   Anxiety    Broken arm Right   Broken leg    Complete heart block (HCC)    Hypertension    Postural dizziness    Past Surgical History:  Procedure Laterality Date   EP IMPLANTABLE DEVICE N/A 01/16/2015   MDT Adapta L PPM implanted by Dr Kelsie for complete heart block   IR GENERIC HISTORICAL  09/15/2016   IR RADIOLOGIST EVAL & MGMT 09/15/2016 [facility]   IR GENERIC HISTORICAL  09/28/2016   IR KYPHO THORACIC WITH BONE BIOPSY 09/28/2016 Thyra Nash, MD [facility]   IR GENERIC HISTORICAL  10/14/2016   IR RADIOLOGIST EVAL & MGMT 10/14/2016 [facility]   PACEMAKER INSERTION N/A 01/2015   Social History   Socioeconomic History   Marital status:  Married    Spouse name: Not on file   Number of children: 2   Years of education: Not on file   Highest education level: Not on file  Occupational History   Not on file  Tobacco Use   Smoking status: Former    Current packs/day: 0.00    Types: Cigarettes    Start date: 09/06/1956    Quit date: 09/06/1986    Years since quitting: 37.9   Smokeless tobacco: Never  Vaping Use   Vaping status: Never Used  Substance and Sexual Activity   Alcohol  use: Yes    Alcohol /week: 0.0 standard drinks of alcohol     Comment: occasionally 1 monthly   Drug use: No   Sexual activity: Not Currently    Partners: Male  Other Topics Concern   Not on file  Social History Narrative   Lives with [place] her husband   Social Drivers of Corporate Investment Banker Strain: Not on file  Food Insecurity: No Food Insecurity (08/09/2024)   Hunger Vital Sign    Worried About Running Out of Food in the Last Year: Never true    Ran Out of Food in the Last Year: Never true  Transportation Needs: No Transportation Needs (08/09/2024)   PRAPARE - Administrator, Civil Service (Medical): No    Lack of Transportation (Non-Medical): No  Physical Activity: Inactive (10/29/2022)   Exercise Vital Sign  Days of Exercise per Week: 0 days    Minutes of Exercise per Session: 0 min  Stress: Not on file  Social Connections: Socially Integrated (08/09/2024)   Social Connection and Isolation Panel    Frequency of Communication with Friends and Family: More than three times a week    Frequency of Social Gatherings with Friends and Family: More than three times a week    Attends Religious Services: More than 4 times per year    Active Member of [employer] or Organizations: Yes    Attends Engineer, Structural: More than 4 times per year    Marital Status: Married   Family History  Problem Relation Age of Onset   Heart failure Mother    Hypertension Mother    Hypertension Father    Atrial fibrillation Father     Allergies  Allergen Reactions   Hctz [Hydrochlorothiazide ] Shortness Of Breath and Other (See Comments)    Very short winded, made her hurt all over    Losartan Other (See Comments)    Skin crawling feelings   Macrolides And Ketolides Other (See Comments)    Pyloric sphincter flareups-unsure of which -mycins   Amlodipine  Other (See Comments)   Benazepril Other (See Comments)    Doesn't work for patient   Penicillins Other (See Comments)    abd pain Has patient had a PCN reaction causing immediate rash, facial/tongue/throat swelling, SOB or lightheadedness with hypotension: No Has patient had a PCN reaction causing severe rash involving mucus membranes or skin necrosis: No Has patient had a PCN reaction that required hospitalization No Has patient had a PCN reaction occurring within the last 10 years: No If all of the above answers are NO, then may proceed with Cephalosporin use.     Prior to Admission medications   Medication Sig Start Date End Date Taking? Authorizing Provider  acetaminophen  (TYLENOL ) 500 MG tablet Take 500 mg by mouth every 6 (six) hours as needed for mild pain.   Yes [provider]  ALPRAZolam  (XANAX ) 0.25 MG tablet Take 0.125 tablets by mouth daily as needed for anxiety.  02/04/15  Yes [provider]  aspirin  81 MG tablet Take 81 mg by mouth daily.   Yes [provider]  aspirin  EC 325 MG tablet Take 325 mg by mouth daily as needed.   Yes [provider]  Besifloxacin HCl 0.6 % SUSP Apply 1 drop to eye 4 (four) times daily. For 2 days after injection   Yes [provider]  calcium  carbonate (TUMS EX) 750 MG chewable tablet Chew 1 tablet by mouth 2 (two) times daily as needed.    Yes [provider]  Cholecalciferol  (VITAMIN D3) 2000 units TABS Take 1 tablet by mouth daily.   Yes [provider]  famotidine  (PEPCID ) 20 MG tablet Take 10-20 mg by mouth at bedtime.   Yes [provider]   faricimab -svoa (VABYSMO ) 6 MG/0.05ML SOLN intravitreal injection 6 mg by Intravitreal route every 5 (five) weeks.   Yes [provider]  fluticasone  (FLONASE ) 50 MCG/ACT nasal spray Place 2 sprays into both nostrils daily as needed for allergies.  12/27/14  Yes [provider]  hydrALAZINE  (APRESOLINE ) 10 MG tablet Take 1 tablet (10 mg total) by mouth in the morning and at bedtime. May take an additional dose of 10 mg Hydralazine  if blood pressure above 170 systolic or hold if blood pressure is less than 100 systolic 02/02/24  Yes Lavona Agent, MD  lidocaine (LIDODERM)  5 % Place 1 patch onto the skin daily as needed. Remove & Discard patch within 12 hours or as directed by MD   Yes [provider]  Magnesium  Hydroxide (PHILLIPS MILK OF MAGNESIA PO) Take 1-2 tablets by mouth 2 (two) times daily as needed.   Yes [provider]  Multiple Vitamins-Minerals (PRESERVISION AREDS) CAPS Take 1 capsule by mouth in the morning and at bedtime.   Yes [provider]   CT TIBIA FIBULA RIGHT W CONTRAST Result Date: 08/10/2024 EXAM: CT of the right tibia/fibula with IV contrast 08/10/2024 03:44:31 PM TECHNIQUE: Axial images were acquired through the right tibia/fibula with IV contrast. Reformatted images were reviewed. Automated exposure control, iterative reconstruction, and/or weight based adjustment of the mA/kV was utilized to reduce the radiation dose to as low as reasonably achievable. COMPARISON: Radiographs 08/08/2024. CLINICAL HISTORY: Suspected hematoma at the level of the right knee. FINDINGS: BONES AND JOINTS: Osteoarthritis of the right knee without significant knee effusion. No tibial or fibular fracture identified. Small well-corticated ossicle below the medial malleolus. No dislocation. The joint spaces are normal. SOFT TISSUES: Mild but age appropriate atrophy. Mildly complex medial calf subcutaneous fluid collection at the proximal diaphyseal level, with the  multilobulated collection measuring about 11.0 x 4.5 x 9.3 cm, with a total volume of 240 ml. No substantial degree of marginal enhancement to indicate superimposed infection; no gas in the collection. Mild stranding in the subcutaneous tissues around the region likely indicating low-grade inflammation. There is also mild subcutaneous edema lateral to the knee and calf. This is more confluent in the distal calf and ankle region. IMPRESSION: 1. Mildly complex medial calf subcutaneous fluid collection at the proximal diaphyseal level, measuring approximately 11.0 x 4.5 x 9.3 cm (volume 240 mL), without substantial marginal enhancement or gas, consistent with a hematoma; mild surrounding stranding likely represents low-grade inflammation. 2. Mild subcutaneous edema lateral to the knee and calf, more confluent in the distal calf and ankle region. 3. Osteoarthritis of the right knee without significant knee effusion. Electronically signed by: Ryan Salvage MD 08/10/2024 05:04 PM EST RP Workstation: [id number]    Positive ROS: All other systems have been reviewed and were otherwise negative with the exception of those mentioned in the HPI and as above.  Physical Exam: General: Alert, no acute distress Cardiovascular: No pedal edema Respiratory: No cyanosis, no use of accessory musculature GI: No organomegaly, abdomen is soft and non-tender Skin: No lesions in the area of chief complaint Neurologic: Sensation intact distally Psychiatric: Patient is competent for consent with normal mood and affect Lymphatic: No axillary or cervical lymphadenopathy  MUSCULOSKELETAL: Right lower extremity - Large skin abrasion measuring approximately 7 x5 cm overlying 11 x 9 cm soft tissue mass of the medial proximal tibia. The mass is not fluctuant and is mildly tender - mass is firm and mildly mobile - surrounding erythema improving per patient with edema up to the level of the knee. - TTP mildly over the medial and  lateral tibia - Able to flex extend knee 0-120 deg without pain - stable to varus/valgus stress, lachman and posterior drawer - sensation intact to light touch sural, saphenous, tibial, deep and superficial peroneal nerve distributions - 2+ DP pulse  Imaging:  X-rays: 2 views of  right tibia were obtained and reviewed.  My independent interpretation is as follows: calcification present within the medial soft tissue with evidence of swelling in the soft tissue envelope  CT right tibia obtained w contrast.  I have  reviewed this personally and my independent interpretation is as follow: evidence of a large mult loculated mass consistent with old hematoma measuring approximately 11 x4 x9 cm.  No rim enhancement concerning for super infection  Assessment: Right medial calf hematoma x 3 weeks Right resolving cellulitis of the right tibia  88 year old female with 3 week history of right medial calf hematoma and large overlying abrasion x 3 weeks with development of cellulitis over the past week now improving on IV abx.  History, exam and imaging are consistent with a complex hematoma that would not be ammenable to percutaneous drainage or I&D due to the skin compromise at this time with the resolving abrasion.  The patient has had improvement in her exam with regard to cellulitis and imaging is not consistent with hematoma super infection.  I recommend daily local wound care for the abrasion and compression for the hematoma. Continued abx per medicine for the cellulitis and outpatient evaluation for her mass/hematoma.  At this time as it does not appear infected, making an incision over that abrasion would likely be associated with significant risk of wound complications and infection.  The patient understands and agrees with the plan.  Plan: WBAT RLE Compression wrap and daily wound care for abrasion Continued abx for cellulitis Tylenol  as needed for pain Frequent elevation and consideration for  compression stocking to assist with swelling which will also assist with wound care Recommend outpatient home health wound care services May follow up in 2 weeks as an outpatient for further evaluation with either Dr. Elsa, myself or one of my other foot and ankle partners    Rankin LELON Pizza, MD    08/11/2024 9:50 AM

## 2024-08-11 NOTE — Progress Notes (Addendum)
 Pharmacy Antibiotic Note  [name] [name] is a 88 y.o. female s/p fall at home on 07/16/24 who presented to the ED on 08/08/2024 with c/o right LE edema, erythema and pain.  She is currently on vancomycin  and ceftriaxone  for LE cellulitis.  Today, 08/11/2024: - day #3 abx - wbc wnl, afeb - scr up 0.96 (crcl~45) - all cultures neg thus far  Plan: - adjust vancomycin  dose to 750 mg IV q24h (stating on 12/7 at 6AM) for est AUC 505 - ceftriaxone  2gm q24h  ________________________________________  Height: 5' 4 (162.6 cm) Weight: 85.7 kg (188 lb 15 oz) IBW/kg (Calculated) : 54.7  Temp (24hrs), Avg:97.7 F (36.5 C), Min:97.7 F (36.5 C), Max:97.8 F (36.6 C)  Recent Labs  Lab 08/08/24 1946 08/08/24 1956 08/09/24 0851 08/10/24 0513  WBC 5.1  --  5.2 4.7  CREATININE 0.86  --  0.75 0.96  LATICACIDVEN  --  0.8  --   --     Estimated Creatinine Clearance: 44.6 mL/min (by C-G formula based on SCr of 0.96 mg/dL).    Allergies  Allergen Reactions   Hctz [Hydrochlorothiazide ] Shortness Of Breath and Other (See Comments)    Very short winded, made her hurt all over    Losartan Other (See Comments)    Skin crawling feelings   Macrolides And Ketolides Other (See Comments)    Pyloric sphincter flareups-unsure of which -mycins   Amlodipine  Other (See Comments)   Benazepril Other (See Comments)    Doesn't work for patient   Penicillins Other (See Comments)    abd pain Has patient had a PCN reaction causing immediate rash, facial/tongue/throat swelling, SOB or lightheadedness with hypotension: No Has patient had a PCN reaction causing severe rash involving mucus membranes or skin necrosis: No Has patient had a PCN reaction that required hospitalization No Has patient had a PCN reaction occurring within the last 10 years: No If all of the above answers are NO, then may proceed with Cephalosporin use.        Thank you for allowing pharmacy to be a part of this patient's  care.  [name] [name] 08/11/2024 8:48 AM

## 2024-08-12 ENCOUNTER — Other Ambulatory Visit (HOSPITAL_COMMUNITY): Payer: Self-pay

## 2024-08-12 MED ORDER — SULFAMETHOXAZOLE-TRIMETHOPRIM 800-160 MG PO TABS
1.0000 | ORAL_TABLET | Freq: Two times a day (BID) | ORAL | Status: DC
  Administered 2024-08-12: 1 via ORAL
  Filled 2024-08-12: qty 1

## 2024-08-12 MED ORDER — SULFAMETHOXAZOLE-TRIMETHOPRIM 800-160 MG PO TABS
1.0000 | ORAL_TABLET | Freq: Two times a day (BID) | ORAL | 0 refills | Status: AC
  Filled 2024-08-12: qty 14, 7d supply, fill #0

## 2024-08-12 NOTE — Discharge Summary (Signed)
 Physician Discharge Summary  [name] [name] FMW:[id number] DOB: 11/18/1935 DOA: 08/08/2024  PCP: Charlott Dorn LABOR, MD  Admit date: 08/08/2024 Discharge date: 08/12/2024  Admitted From: Home Disposition:  Home  Recommendations for Outpatient Follow-up:  Follow up with PCP in 1-2 weeks Please obtain BMP/CBC in one week   Home Health:No Equipment/Devices:None  Discharge Condition:Stable CODE STATUS:Full Diet recommendation: Heart Healthy  Brief/Interim Summary: 88 y.o. female past medical history of complete heart block status post pacemaker placement 2016, mild aortic stenosis, essential hypertension comes into us  along for the right lower extremity edema and pain after a sustained fall on 07/16/2024.  She saw her primary care doctor who prescribed Keflex , she relates that her swelling and redness has progressed.  Seen in the clinic the day prior to admission was started on empiric IV antibiotics.  Discharge Diagnoses:  Principal Problem:   Cellulitis of right lower extremity Active Problems:   Hypertension   Nonrheumatic aortic valve stenosis  Right lower extremity cellulitis with hematoma:  She had a white count erythema swelling and tenderness she was start empirically on Rocephin  and IV vancomycin  lower extremity Doppler was negative for DVT. CT of the tibia showed 11 x 5 x 10 hematoma. Orthopedic surgery was consulted and recommended conservative management due to her cellulitis. Will follow-up with them as an outpatient Erythema receded, she will continue Bactrim  for 10 additional days and outpatient.  Essential hypertension: No change made to her medication.  Nonrheumatic aortic valve stenosis: No change made to her medication.  Discharge Instructions  Discharge Instructions     Diet - low sodium heart healthy   Complete by: As directed    Increase activity slowly   Complete by: As directed    No wound care   Complete by: As directed       Allergies as  of 08/12/2024       Reactions   Hctz [hydrochlorothiazide ] Shortness Of Breath, Other (See Comments)   Very short winded, made her hurt all over    Losartan Other (See Comments)   Skin crawling feelings   Macrolides And Ketolides Other (See Comments)   Pyloric sphincter flareups-unsure of which -mycins   Amlodipine  Other (See Comments)   Benazepril Other (See Comments)   Doesn't work for patient   Penicillins Other (See Comments)   abd pain Has patient had a PCN reaction causing immediate rash, facial/tongue/throat swelling, SOB or lightheadedness with hypotension: No Has patient had a PCN reaction causing severe rash involving mucus membranes or skin necrosis: No Has patient had a PCN reaction that required hospitalization No Has patient had a PCN reaction occurring within the last 10 years: No If all of the above answers are NO, then may proceed with Cephalosporin use.        Medication List     TAKE these medications    acetaminophen  500 MG tablet Commonly known as: TYLENOL  Take 500 mg by mouth every 6 (six) hours as needed for mild pain.   ALPRAZolam  0.25 MG tablet Commonly known as: XANAX  Take 0.125 tablets by mouth daily as needed for anxiety.   aspirin  81 MG tablet Take 81 mg by mouth daily.   aspirin  EC 325 MG tablet Take 325 mg by mouth daily as needed.   Besifloxacin HCl 0.6 % Susp Apply 1 drop to eye 4 (four) times daily. For 2 days after injection   calcium  carbonate 750 MG chewable tablet Commonly known as: TUMS EX Chew 1 tablet by mouth 2 (two) times daily  as needed.   famotidine  20 MG tablet Commonly known as: PEPCID  Take 10-20 mg by mouth at bedtime.   faricimab -svoa 6 MG/0.05ML Soln intravitreal injection Commonly known as: VABYSMO  6 mg by Intravitreal route every 5 (five) weeks.   fluticasone  50 MCG/ACT nasal spray Commonly known as: FLONASE  Place 2 sprays into both nostrils daily as needed for allergies.   hydrALAZINE  10 MG  tablet Commonly known as: APRESOLINE  Take 1 tablet (10 mg total) by mouth in the morning and at bedtime. May take an additional dose of 10 mg Hydralazine  if blood pressure above 170 systolic or hold if blood pressure is less than 100 systolic   lidocaine 5 % Commonly known as: LIDODERM Place 1 patch onto the skin daily as needed. Remove & Discard patch within 12 hours or as directed by MD   PHILLIPS MILK OF MAGNESIA PO Take 1-2 tablets by mouth 2 (two) times daily as needed.   PreserVision AREDS Caps Take 1 capsule by mouth in the morning and at bedtime.   sulfamethoxazole -trimethoprim  800-160 MG tablet Commonly known as: BACTRIM  DS Take 1 tablet by mouth every 12 (twelve) hours for 7 days.   Vitamin D3 50 MCG (2000 UT) Tabs Take 1 tablet by mouth daily.        Follow-up Information     [name], MD. Schedule an appointment as soon as possible for a visit today.   Specialty: Orthopedic Surgery Why: For wound re-check in 1 week. Contact information: [address] [place] [address] [phone]                Allergies  Allergen Reactions   Hctz [Hydrochlorothiazide ] Shortness Of Breath and Other (See Comments)    Very short winded, made her hurt all over    Losartan Other (See Comments)    Skin crawling feelings   Macrolides And Ketolides Other (See Comments)    Pyloric sphincter flareups-unsure of which -mycins   Amlodipine  Other (See Comments)   Benazepril Other (See Comments)    Doesn't work for patient   Penicillins Other (See Comments)    abd pain Has patient had a PCN reaction causing immediate rash, facial/tongue/throat swelling, SOB or lightheadedness with hypotension: No Has patient had a PCN reaction causing severe rash involving mucus membranes or skin necrosis: No Has patient had a PCN reaction that required hospitalization No Has patient had a PCN reaction occurring within the last 10 years: No If all of the above answers are  NO, then may proceed with Cephalosporin use.      Consultations: Orthopedic surgery   Procedures/Studies: CT TIBIA FIBULA RIGHT W CONTRAST Result Date: 08/10/2024 EXAM: CT of the right tibia/fibula with IV contrast 08/10/2024 03:44:31 PM TECHNIQUE: Axial images were acquired through the right tibia/fibula with IV contrast. Reformatted images were reviewed. Automated exposure control, iterative reconstruction, and/or weight based adjustment of the mA/kV was utilized to reduce the radiation dose to as low as reasonably achievable. COMPARISON: Radiographs 08/08/2024. CLINICAL HISTORY: Suspected hematoma at the level of the right knee. FINDINGS: BONES AND JOINTS: Osteoarthritis of the right knee without significant knee effusion. No tibial or fibular fracture identified. Small well-corticated ossicle below the medial malleolus. No dislocation. The joint spaces are normal. SOFT TISSUES: Mild but age appropriate atrophy. Mildly complex medial calf subcutaneous fluid collection at the proximal diaphyseal level, with the multilobulated collection measuring about 11.0 x 4.5 x 9.3 cm, with a total volume of 240 ml. No substantial degree of marginal enhancement to  indicate superimposed infection; no gas in the collection. Mild stranding in the subcutaneous tissues around the region likely indicating low-grade inflammation. There is also mild subcutaneous edema lateral to the knee and calf. This is more confluent in the distal calf and ankle region. IMPRESSION: 1. Mildly complex medial calf subcutaneous fluid collection at the proximal diaphyseal level, measuring approximately 11.0 x 4.5 x 9.3 cm (volume 240 mL), without substantial marginal enhancement or gas, consistent with a hematoma; mild surrounding stranding likely represents low-grade inflammation. 2. Mild subcutaneous edema lateral to the knee and calf, more confluent in the distal calf and ankle region. 3. Osteoarthritis of the right knee without  significant knee effusion. Electronically signed by: Ryan Salvage MD 08/10/2024 05:04 PM EST RP Workstation: [id number]   VAS US  LOWER EXTREMITY VENOUS (DVT) Result Date: 08/09/2024  Lower Venous DVT Study Patient Name:  Hector Kindler  Date of Exam:   08/09/2024 Medical Rec #: [id number]       Accession #:    [id number] Date of Birth: [birth year]        Patient Gender: F Patient Age:   88 years Exam Location:  [facility] Procedure:      VAS US  LOWER EXTREMITY VENOUS (DVT) Referring Phys: ROCKIE FOUST --------------------------------------------------------------------------------  Indications: Swelling, Erythema, Edema, Pain, and Patient stated that she fell on 07/16/24 where she scratched her leg on the plateform of her cane.  Limitations: Body habitus, poor ultrasound/tissue interface and open wound. Comparison Study: No priors. Performing Technologist: Ricka Sturdivant-Jones RDMS, RVT  Examination Guidelines: A complete evaluation includes B-mode imaging, spectral Doppler, color Doppler, and power Doppler as needed of all accessible portions of each vessel. Bilateral testing is considered an integral part of a complete examination. Limited examinations for reoccurring indications may be performed as noted. The reflux portion of the exam is performed with the patient in reverse Trendelenburg.  +---------+---------------+---------+-----------+----------+--------------+ RIGHT    CompressibilityPhasicitySpontaneityPropertiesThrombus Aging +---------+---------------+---------+-----------+----------+--------------+ CFV      Full           Yes      Yes                                 +---------+---------------+---------+-----------+----------+--------------+ SFJ      Full                                                        +---------+---------------+---------+-----------+----------+--------------+ FV Prox  Full                                                         +---------+---------------+---------+-----------+----------+--------------+ FV Mid   Full           Yes      Yes                                 +---------+---------------+---------+-----------+----------+--------------+ FV DistalFull                                                        +---------+---------------+---------+-----------+----------+--------------+  PFV      Full                                                        +---------+---------------+---------+-----------+----------+--------------+ POP      Full           Yes      Yes                                 +---------+---------------+---------+-----------+----------+--------------+ PTV      Full                                                        +---------+---------------+---------+-----------+----------+--------------+ PERO     Full                                                        +---------+---------------+---------+-----------+----------+--------------+   +----+---------------+---------+-----------+----------+--------------+ LEFTCompressibilityPhasicitySpontaneityPropertiesThrombus Aging +----+---------------+---------+-----------+----------+--------------+ CFV Full           Yes      Yes                                 +----+---------------+---------+-----------+----------+--------------+ SFJ Full                                                        +----+---------------+---------+-----------+----------+--------------+   Summary: RIGHT: - There is no evidence of deep vein thrombosis in the lower extremity.  - No cystic structure found in the popliteal fossa. - A large anechoic/cystic structure located in the proximal medial calf noted.  LEFT: - No evidence of common femoral vein obstruction.   *See table(s) above for measurements and observations. Electronically signed by Debby Robertson on 08/09/2024 at 2:18:39 PM.    Final    DG Tibia/Fibula Right Port Result Date:  08/08/2024 CLINICAL DATA:  Cellulitis EXAM: PORTABLE RIGHT TIBIA AND FIBULA - 2 VIEW COMPARISON:  Right tibia and fibula x-ray 07/22/2024 FINDINGS: The bones are osteopenic. There is no acute fracture, dislocation or focal osseous lesion. Joint spaces are maintained. There is diffuse soft tissue swelling and edema of the lower extremity. There is a oval 6.3 x 3.5 by 4.4 cm subcutaneous soft tissue density of the proximal medial left calf. This is unchanged from the prior study. IMPRESSION: 1. No acute fracture or dislocation. 2. Diffuse soft tissue swelling and edema of the lower extremity. 3. Unchanged oval 6.3 x 3.5 x 4.4 cm subcutaneous soft tissue density of the proximal medial left calf. Please correlate clinically. Electronically Signed   By: Greig Pique M.D.   On: 08/08/2024 23:13   (Echo, Carotid, EGD, Colonoscopy, ERCP)    Subjective: No complaints  Discharge Exam: Vitals:   08/11/24 2108 08/12/24 0544  BP: (!) 179/72 (!) 155/52  Pulse: 70 63  Resp: 14 14  Temp: 97.6 F (36.4 C) 97.7 F (36.5 C)  SpO2: 96% 96%   Vitals:   08/11/24 1008 08/11/24 1404 08/11/24 2108 08/12/24 0544  BP: 137/61 (!) 142/65 (!) 179/72 (!) 155/52  Pulse: 68 64 70 63  Resp: 18 18 14 14   Temp: 97.8 F (36.6 C) 97.9 F (36.6 C) 97.6 F (36.4 C) 97.7 F (36.5 C)  TempSrc: Oral Oral Oral Oral  SpO2: 97% 95% 96% 96%  Weight:      Height:        General: Pt is alert, awake, not in acute distress Cardiovascular: RRR, S1/S2 +, no rubs, no gallops Respiratory: CTA bilaterally, no wheezing, no rhonchi Abdominal: Soft, NT, ND, bowel sounds + Extremities: no edema, no cyanosis    The results of significant diagnostics from this hospitalization (including imaging, microbiology, ancillary and laboratory) are listed below for reference.     Microbiology: Recent Results (from the past 240 hours)  Blood culture (routine x 2)     Status: None (Preliminary result)   Collection Time: 08/08/24 11:24 PM    Specimen: BLOOD RIGHT FOREARM  Result Value Ref Range Status   Specimen Description   Final    BLOOD RIGHT FOREARM Performed at [facility], [address] [place], [place] [address]    Special Requests   Final    BOTTLES DRAWN AEROBIC AND ANAEROBIC Blood Culture results may not be optimal due to an inadequate volume of blood received in culture bottles Performed at [facility], [address] [place], [place] [address]    Culture   Final    NO GROWTH 2 DAYS Performed at [facility], [address], [place], [place] [address]    Report Status PENDING  Incomplete  Blood culture (routine x 2)     Status: None (Preliminary result)   Collection Time: 08/09/24 10:48 AM   Specimen: BLOOD  Result Value Ref Range Status   Specimen Description   Final    BLOOD BLOOD LEFT HAND Performed at [facility], [address], [place], [place] [address]    Special Requests   Final    BOTTLES DRAWN AEROBIC AND ANAEROBIC Blood Culture results may not be optimal due to an inadequate volume of blood received in culture bottles Performed at [facility], [address] [place], [place] [address]    Culture   Final    NO GROWTH 2 DAYS Performed at [facility], [address] [place], [place] [address]    Report Status PENDING  Incomplete     Labs: BNP (last 3 results) No results for input(s): BNP in the last 8760 hours. Basic Metabolic Panel: Recent Labs  Lab 08/08/24 1946 08/09/24 0851 08/10/24 0513  NA 138 137 140  K 4.1 4.3 4.3  CL 102 102 105  CO2 25 25 26   GLUCOSE 107* 90 85  BUN 16 13 19   CREATININE 0.86 0.75 0.96  CALCIUM  9.6 9.6 9.8   Liver Function Tests: Recent Labs  Lab 08/08/24 1946  AST 33  ALT 21  ALKPHOS 296*  BILITOT 0.4  PROT 7.5  ALBUMIN 4.0   No results for input(s): LIPASE, AMYLASE in the last 168 hours. No results for input(s): AMMONIA in the last 168  hours. CBC: Recent Labs  Lab 08/08/24 1946 08/09/24 0851 08/10/24 0513  WBC 5.1 5.2 4.7  NEUTROABS 3.6  --   --   HGB 12.9 12.0 11.2*  HCT 40.2 36.8 35.6*  MCV 91.2 92.2 92.0  PLT 318 287 288   Cardiac Enzymes: No results for input(s): CKTOTAL, CKMB, CKMBINDEX, TROPONINI in the last 168 hours. BNP: Invalid input(s): POCBNP CBG: No results for input(s): GLUCAP in the last 168 hours. D-Dimer No results for input(s): DDIMER in the last 72 hours. Hgb A1c No results for input(s): HGBA1C in the last 72 hours. Lipid Profile No results for input(s): CHOL, HDL, LDLCALC, TRIG, CHOLHDL, LDLDIRECT in the last 72 hours. Thyroid function studies No results for input(s): TSH, T4TOTAL, T3FREE, THYROIDAB in the last 72 hours.  Invalid input(s): FREET3 Anemia work up No results for input(s): VITAMINB12, FOLATE, FERRITIN, TIBC, IRON, RETICCTPCT in the last 72 hours. Urinalysis    Component Value Date/Time   COLORURINE YELLOW 09/10/2020 1550   APPEARANCEUR HAZY (A) 09/10/2020 1550   LABSPEC 1.016 09/10/2020 1550   PHURINE 6.0 09/10/2020 1550   GLUCOSEU NEGATIVE 09/10/2020 1550   HGBUR NEGATIVE 09/10/2020 1550   BILIRUBINUR NEGATIVE 09/10/2020 1550   KETONESUR NEGATIVE 09/10/2020 1550   PROTEINUR NEGATIVE 09/10/2020 1550   UROBILINOGEN 0.2 02/02/2015 1920   NITRITE NEGATIVE 09/10/2020 1550   LEUKOCYTESUR NEGATIVE 09/10/2020 1550   Sepsis Labs Recent Labs  Lab 08/08/24 1946 08/09/24 0851 08/10/24 0513  WBC 5.1 5.2 4.7   Microbiology Recent Results (from the past 240 hours)  Blood culture (routine x 2)     Status: None (Preliminary result)   Collection Time: 08/08/24 11:24 PM   Specimen: BLOOD RIGHT FOREARM  Result Value Ref Range Status   Specimen Description   Final    BLOOD RIGHT FOREARM Performed at [facility], [address], [place], [place] [address]    Special Requests   Final    BOTTLES DRAWN AEROBIC AND  ANAEROBIC Blood Culture results may not be optimal due to an inadequate volume of blood received in culture bottles Performed at [facility], [address], [place], [place] [address]    Culture   Final    NO GROWTH 2 DAYS Performed at [facility], [address] [place], [place] [address]    Report Status PENDING  Incomplete  Blood culture (routine x 2)     Status: None (Preliminary result)   Collection Time: 08/09/24 10:48 AM   Specimen: BLOOD  Result Value Ref Range Status   Specimen Description   Final    BLOOD BLOOD LEFT HAND Performed at [facility], [address] [place], [place] [address]    Special Requests   Final    BOTTLES DRAWN AEROBIC AND ANAEROBIC Blood Culture results may not be optimal due to an inadequate volume of blood received in culture bottles Performed at [facility], [address] [place], [place] [address]    Culture   Final    NO GROWTH 2 DAYS Performed at [facility], [address] [place], [place] [address]    Report Status PENDING  Incomplete     Time coordinating discharge: Over 35 minutes  SIGNED:   Erle Odell Castor, MD  [facility] 08/12/2024, 8:06 AM Pager   If 7PM-7AM, please contact night-coverage [email or web address] Password [email or web address]

## 2024-08-13 ENCOUNTER — Ambulatory Visit: Admitting: Podiatry

## 2024-08-13 ENCOUNTER — Other Ambulatory Visit (HOSPITAL_COMMUNITY): Payer: Self-pay

## 2024-08-14 ENCOUNTER — Ambulatory Visit: Payer: Medicare PPO

## 2024-08-14 LAB — CULTURE, BLOOD (ROUTINE X 2)
Culture: NO GROWTH
Culture: NO GROWTH

## 2024-08-15 ENCOUNTER — Encounter (INDEPENDENT_AMBULATORY_CARE_PROVIDER_SITE_OTHER): Admitting: Ophthalmology

## 2024-08-15 DIAGNOSIS — H35033 Hypertensive retinopathy, bilateral: Secondary | ICD-10-CM

## 2024-08-15 DIAGNOSIS — D3131 Benign neoplasm of right choroid: Secondary | ICD-10-CM | POA: Diagnosis not present

## 2024-08-15 DIAGNOSIS — H353231 Exudative age-related macular degeneration, bilateral, with active choroidal neovascularization: Secondary | ICD-10-CM | POA: Diagnosis not present

## 2024-08-15 DIAGNOSIS — H43813 Vitreous degeneration, bilateral: Secondary | ICD-10-CM

## 2024-08-15 DIAGNOSIS — I10 Essential (primary) hypertension: Secondary | ICD-10-CM | POA: Diagnosis not present

## 2024-08-15 LAB — CUP PACEART REMOTE DEVICE CHECK
Battery Impedance: 1948 Ohm
Battery Remaining Longevity: 36 mo
Battery Voltage: 2.75 V
Brady Statistic AP VP Percent: 7 %
Brady Statistic AP VS Percent: 0 %
Brady Statistic AS VP Percent: 93 %
Brady Statistic AS VS Percent: 0 %
Date Time Interrogation Session: 20251209190644
Implantable Lead Connection Status: 753985
Implantable Lead Connection Status: 753985
Implantable Lead Implant Date: 20160512
Implantable Lead Implant Date: 20160512
Implantable Lead Location: 753859
Implantable Lead Location: 753860
Implantable Lead Model: 5076
Implantable Lead Model: 5092
Implantable Pulse Generator Implant Date: 20160512
Lead Channel Impedance Value: 437 Ohm
Lead Channel Impedance Value: 707 Ohm
Lead Channel Pacing Threshold Amplitude: 0.625 V
Lead Channel Pacing Threshold Amplitude: 0.625 V
Lead Channel Pacing Threshold Pulse Width: 0.4 ms
Lead Channel Pacing Threshold Pulse Width: 0.4 ms
Lead Channel Setting Pacing Amplitude: 2 V
Lead Channel Setting Pacing Amplitude: 2.5 V
Lead Channel Setting Pacing Pulse Width: 0.4 ms
Lead Channel Setting Sensing Sensitivity: 2.8 mV
Zone Setting Status: 755011
Zone Setting Status: 755011

## 2024-08-20 ENCOUNTER — Ambulatory Visit: Admitting: Podiatry

## 2024-08-20 DIAGNOSIS — M79675 Pain in left toe(s): Secondary | ICD-10-CM | POA: Diagnosis not present

## 2024-08-20 DIAGNOSIS — M79674 Pain in right toe(s): Secondary | ICD-10-CM | POA: Diagnosis not present

## 2024-08-20 DIAGNOSIS — B351 Tinea unguium: Secondary | ICD-10-CM

## 2024-08-20 NOTE — Progress Notes (Signed)
 Subjective:   Patient ID: [name] [name], female   DOB: 88 y.o.   MRN: [id number]   HPI Chief Complaint  Patient presents with   Nail Problem    Nail trim     88 year old female presents the office with above concerns.  States that her nails are thick elongated she is not able to bend over and trim them herself.  She has a corn on the side of her right fifth toe causing pain.  No open lesions.  No swelling, redness or drainage.      Objective:  Physical Exam  General: AAO x3, NAD  Dermatological: Nails are hypertrophic, dystrophic, brittle, discolored, elongated 10. No surrounding redness or drainage. Tenderness nails 1-5 bilaterally.  Hyperkeratotic lesion noted along the medial aspect the right fifth toe.  There is no underlying ulceration, drainage or any signs of infection.  Vascular: Dorsalis Pedis artery and Posterior Tibial artery pedal pulses are 2/4 bilateral with immedate capillary fill time. There is no pain with calf compression, swelling, warmth, erythema.   Neruologic: Grossly intact via light touch bilateral.   Musculoskeletal: Digital contractures present.   Gait: Unassisted, Nonantalgic.       Assessment:   88 year old female with symptomatic onychomycosis     Plan:  -Treatment options discussed including all alternatives, risks, and complications -Etiology of symptoms were discussed -Sharply debrided nails x 10 without any complications or bleeding -As a courtesy debride the corn on the right fifth toe without any complications or bleeding.  Continue offloading. - Daily foot inspection.  Return in about 3 months (around 11/18/2024).  Donnice JONELLE Fees DPM

## 2024-08-21 NOTE — Progress Notes (Signed)
 Remote PPM Transmission

## 2024-08-24 ENCOUNTER — Ambulatory Visit: Payer: Self-pay | Admitting: Cardiology

## 2024-09-19 ENCOUNTER — Encounter (INDEPENDENT_AMBULATORY_CARE_PROVIDER_SITE_OTHER): Admitting: Ophthalmology

## 2024-09-19 DIAGNOSIS — H353231 Exudative age-related macular degeneration, bilateral, with active choroidal neovascularization: Secondary | ICD-10-CM | POA: Diagnosis not present

## 2024-09-19 DIAGNOSIS — H35033 Hypertensive retinopathy, bilateral: Secondary | ICD-10-CM

## 2024-09-19 DIAGNOSIS — I10 Essential (primary) hypertension: Secondary | ICD-10-CM | POA: Diagnosis not present

## 2024-09-19 DIAGNOSIS — D3131 Benign neoplasm of right choroid: Secondary | ICD-10-CM | POA: Diagnosis not present

## 2024-09-19 DIAGNOSIS — H43813 Vitreous degeneration, bilateral: Secondary | ICD-10-CM

## 2024-10-05 ENCOUNTER — Other Ambulatory Visit: Payer: Self-pay

## 2024-10-05 ENCOUNTER — Emergency Department (HOSPITAL_BASED_OUTPATIENT_CLINIC_OR_DEPARTMENT_OTHER)

## 2024-10-05 ENCOUNTER — Encounter (HOSPITAL_BASED_OUTPATIENT_CLINIC_OR_DEPARTMENT_OTHER): Payer: Self-pay

## 2024-10-05 ENCOUNTER — Emergency Department (HOSPITAL_BASED_OUTPATIENT_CLINIC_OR_DEPARTMENT_OTHER)
Admission: EM | Admit: 2024-10-05 | Discharge: 2024-10-06 | Disposition: A | Attending: Emergency Medicine | Admitting: Emergency Medicine

## 2024-10-05 DIAGNOSIS — M5432 Sciatica, left side: Secondary | ICD-10-CM | POA: Diagnosis not present

## 2024-10-05 DIAGNOSIS — M25552 Pain in left hip: Secondary | ICD-10-CM | POA: Diagnosis present

## 2024-10-05 DIAGNOSIS — I10 Essential (primary) hypertension: Secondary | ICD-10-CM | POA: Diagnosis not present

## 2024-10-05 DIAGNOSIS — Z7982 Long term (current) use of aspirin: Secondary | ICD-10-CM | POA: Insufficient documentation

## 2024-10-05 DIAGNOSIS — Z950 Presence of cardiac pacemaker: Secondary | ICD-10-CM | POA: Diagnosis not present

## 2024-10-05 LAB — CBC WITH DIFFERENTIAL/PLATELET
Abs Immature Granulocytes: 0.02 10*3/uL (ref 0.00–0.07)
Basophils Absolute: 0 10*3/uL (ref 0.0–0.1)
Basophils Relative: 1 %
Eosinophils Absolute: 0.2 10*3/uL (ref 0.0–0.5)
Eosinophils Relative: 3 %
HCT: 41.5 % (ref 36.0–46.0)
Hemoglobin: 13.4 g/dL (ref 12.0–15.0)
Immature Granulocytes: 0 %
Lymphocytes Relative: 23 %
Lymphs Abs: 1.4 10*3/uL (ref 0.7–4.0)
MCH: 28.9 pg (ref 26.0–34.0)
MCHC: 32.3 g/dL (ref 30.0–36.0)
MCV: 89.4 fL (ref 80.0–100.0)
Monocytes Absolute: 0.7 10*3/uL (ref 0.1–1.0)
Monocytes Relative: 11 %
Neutro Abs: 3.9 10*3/uL (ref 1.7–7.7)
Neutrophils Relative %: 62 %
Platelets: 230 10*3/uL (ref 150–400)
RBC: 4.64 MIL/uL (ref 3.87–5.11)
RDW: 15.3 % (ref 11.5–15.5)
WBC: 6.3 10*3/uL (ref 4.0–10.5)
nRBC: 0 % (ref 0.0–0.2)

## 2024-10-05 LAB — COMPREHENSIVE METABOLIC PANEL WITH GFR
ALT: 25 U/L (ref 0–44)
AST: 33 U/L (ref 15–41)
Albumin: 4.2 g/dL (ref 3.5–5.0)
Alkaline Phosphatase: 240 U/L — ABNORMAL HIGH (ref 38–126)
Anion gap: 12 (ref 5–15)
BUN: 17 mg/dL (ref 8–23)
CO2: 25 mmol/L (ref 22–32)
Calcium: 10.1 mg/dL (ref 8.9–10.3)
Chloride: 102 mmol/L (ref 98–111)
Creatinine, Ser: 0.83 mg/dL (ref 0.44–1.00)
GFR, Estimated: >60 mL/min
Glucose, Bld: 93 mg/dL (ref 70–99)
Potassium: 4.3 mmol/L (ref 3.5–5.1)
Sodium: 139 mmol/L (ref 135–145)
Total Bilirubin: 0.5 mg/dL (ref 0.0–1.2)
Total Protein: 7.5 g/dL (ref 6.5–8.1)

## 2024-10-05 MED ORDER — ACETAMINOPHEN 500 MG PO TABS
1000.0000 mg | ORAL_TABLET | Freq: Once | ORAL | Status: AC
  Administered 2024-10-06: 1000 mg via ORAL
  Filled 2024-10-05: qty 2

## 2024-10-05 MED ORDER — KETOROLAC TROMETHAMINE 15 MG/ML IJ SOLN
15.0000 mg | Freq: Once | INTRAMUSCULAR | Status: DC
  Filled 2024-10-05: qty 1

## 2024-10-05 MED ORDER — LIDOCAINE 5 % EX PTCH
1.0000 | MEDICATED_PATCH | CUTANEOUS | Status: DC
  Administered 2024-10-06: 1 via TRANSDERMAL
  Filled 2024-10-05: qty 1

## 2024-10-05 NOTE — ED Triage Notes (Signed)
 Pt reports L leg pain x1 day. Pt denies any trauma or injury.

## 2024-10-05 NOTE — ED Provider Notes (Incomplete)
 " [place] [facility] AT [facility] Provider Note   CSN: [id number] Arrival date & time: 10/05/24  1739     Patient presents with: Leg Pain   [name] [name] is a 89 y.o. female.  {Add pertinent medical, surgical, social history, OB history to HPI:32947}  Leg Pain      Prior to Admission medications  Medication Sig Start Date End Date Taking? Authorizing Provider  acetaminophen  (TYLENOL ) 500 MG tablet Take 500 mg by mouth every 6 (six) hours as needed for mild pain.    [provider]  ALPRAZolam  (XANAX ) 0.25 MG tablet Take 0.125 tablets by mouth daily as needed for anxiety.  02/04/15   [provider]  aspirin  81 MG tablet Take 81 mg by mouth daily.    [provider]  aspirin  EC 325 MG tablet Take 325 mg by mouth daily as needed.    [provider]  Besifloxacin HCl 0.6 % SUSP Apply 1 drop to eye 4 (four) times daily. For 2 days after injection    [provider]  calcium  carbonate (TUMS EX) 750 MG chewable tablet Chew 1 tablet by mouth 2 (two) times daily as needed.     [provider]  Cholecalciferol  (VITAMIN D3) 2000 units TABS Take 1 tablet by mouth daily.    [provider]  famotidine  (PEPCID ) 20 MG tablet Take 10-20 mg by mouth at bedtime.    [provider]  faricimab -svoa (VABYSMO ) 6 MG/0.05ML SOLN intravitreal injection 6 mg by Intravitreal route every 5 (five) weeks.    [provider]  fluticasone  (FLONASE ) 50 MCG/ACT nasal spray Place 2 sprays into both nostrils daily as needed for allergies.  12/27/14   [provider]  hydrALAZINE  (APRESOLINE ) 10 MG tablet Take 1 tablet (10 mg total) by mouth in the morning and at bedtime. May take an additional dose of 10 mg Hydralazine  if blood pressure above 170 systolic or hold if blood pressure is less than 100 systolic 02/02/24   Lavona Agent, MD  lidocaine  (LIDODERM ) 5 % Place 1 patch onto the skin daily as needed.  Remove & Discard patch within 12 hours or as directed by MD    [provider]  Magnesium  Hydroxide (PHILLIPS MILK OF MAGNESIA PO) Take 1-2 tablets by mouth 2 (two) times daily as needed.    [provider]  Multiple Vitamins-Minerals (PRESERVISION AREDS) CAPS Take 1 capsule by mouth in the morning and at bedtime.    [provider]    Allergies: Hctz [hydrochlorothiazide ], Losartan, Macrolides and ketolides, Amlodipine , Benazepril, and Penicillins    Review of Systems  Updated Vital Signs BP (!) 176/108 (BP Location: Right Arm)   Pulse 83   Temp 97.8 F (36.6 C) (Temporal)   Resp 18   Ht 5' 4 (1.626 m)   Wt 81.6 kg   SpO2 100%   BMI 30.90 kg/m   Physical Exam  (all labs ordered are listed, but only abnormal results are displayed) Labs Reviewed  CBC WITH DIFFERENTIAL/PLATELET  COMPREHENSIVE METABOLIC PANEL WITH GFR    EKG: None  Radiology: No results found.  {Document cardiac monitor, telemetry assessment procedure when appropriate:32947} Procedures   Medications Ordered in the ED - No data to display    {Click here for ABCD2, HEART and other calculators REFRESH Note before signing:1}  Medical Decision Making Amount and/or Complexity of Data Reviewed Labs: ordered.   ***  {Document critical care time when appropriate  Document review of labs and clinical decision tools ie CHADS2VASC2, etc  Document your independent review of radiology images and any outside records  Document your discussion with family members, caretakers and with consultants  Document social determinants of health affecting pt's care  Document your decision making why or why not admission, treatments were needed:32947:::1}   Final diagnoses:  None    ED Discharge Orders     None        "

## 2024-10-06 MED ORDER — LIDOCAINE 5 % EX PTCH: 1.0000 | MEDICATED_PATCH | CUTANEOUS | 0 refills | Status: AC

## 2024-10-06 MED ORDER — PREDNISONE 10 MG PO TABS: ORAL_TABLET | ORAL | 0 refills | Status: AC

## 2024-10-06 MED ORDER — KETOROLAC TROMETHAMINE 30 MG/ML IJ SOLN
30.0000 mg | Freq: Once | INTRAMUSCULAR | Status: AC
  Administered 2024-10-06: 30 mg via INTRAMUSCULAR
  Filled 2024-10-06: qty 1

## 2024-10-06 NOTE — Discharge Instructions (Addendum)
 The pain in your left hip seems to be secondary to nerve irritation.  The ultrasound of your left leg did not show any evidence of a blood clot.  No fracture or dislocation on your hip x-ray.  Please engage in light physical activity (like walking) to prevent your back pain from worsening and to prevent stiffness. Refrain from bedrest which can make your pain worse.   You may take 500mg  of naproxen (Aleve) every 12 hours to help with pain.  You may take up to 1000mg  of tylenol  every 6 hours as needed for pain.    You may use a heating pack to help with the pain.  You have been prescribed prednisone . Please take this medication as prescribed for the next 7 days (40mg  on days 1 and 2, 30mg  on days 3 and 4, 20mg  on days 5 and 6, 10mg  on day 7). Take this medication in the morning with breakfast, as taking it at night may make it hard to sleep. If you are a diabetic, please monitor your blood sugars closely on this medication, as it can cause your blood sugar to rise.   You have been prescribed lidocaine  patched to help with pain. You may apply one patch to your back for up to 12 hours at a time. Then, you must remove the patch for a full 12 hours before re-applying a new patch.    Please contact your PCP if your back pain does not start to improve over the next 2 weeks as you may benefit from a PT referral from your PCP.   Return to the ER if you have loss of bowel or bladder control, you develop fever, you have numbness in your groin, or if you have any other new or concerning symptoms.

## 2024-10-11 ENCOUNTER — Encounter (HOSPITAL_COMMUNITY): Payer: Self-pay | Admitting: Emergency Medicine

## 2024-10-11 ENCOUNTER — Other Ambulatory Visit: Payer: Self-pay

## 2024-10-11 ENCOUNTER — Emergency Department (HOSPITAL_COMMUNITY)
Admission: EM | Admit: 2024-10-11 | Source: Home / Self Care | Attending: Emergency Medicine | Admitting: Emergency Medicine

## 2024-10-11 LAB — BASIC METABOLIC PANEL WITH GFR
Anion gap: 11 (ref 5–15)
BUN: 22 mg/dL (ref 8–23)
CO2: 24 mmol/L (ref 22–32)
Calcium: 9.5 mg/dL (ref 8.9–10.3)
Chloride: 103 mmol/L (ref 98–111)
Creatinine, Ser: 0.98 mg/dL (ref 0.44–1.00)
GFR, Estimated: 56 mL/min — ABNORMAL LOW
Glucose, Bld: 154 mg/dL — ABNORMAL HIGH (ref 70–99)
Potassium: 5.1 mmol/L (ref 3.5–5.1)
Sodium: 138 mmol/L (ref 135–145)

## 2024-10-11 LAB — CBC
HCT: 43.5 % (ref 36.0–46.0)
Hemoglobin: 13.8 g/dL (ref 12.0–15.0)
MCH: 28.8 pg (ref 26.0–34.0)
MCHC: 31.7 g/dL (ref 30.0–36.0)
MCV: 90.6 fL (ref 80.0–100.0)
Platelets: 251 10*3/uL (ref 150–400)
RBC: 4.8 MIL/uL (ref 3.87–5.11)
RDW: 15.9 % — ABNORMAL HIGH (ref 11.5–15.5)
WBC: 7.9 10*3/uL (ref 4.0–10.5)
nRBC: 0 % (ref 0.0–0.2)

## 2024-10-11 MED ORDER — FAMOTIDINE 20 MG PO TABS
10.0000 mg | ORAL_TABLET | Freq: Every day | ORAL | Status: AC
  Administered 2024-10-11 – 2024-10-12 (×2): 20 mg via ORAL
  Filled 2024-10-11 (×2): qty 1

## 2024-10-11 MED ORDER — ACETAMINOPHEN 500 MG PO TABS: 500.0000 mg | ORAL_TABLET | Freq: Four times a day (QID) | ORAL | Status: AC | PRN

## 2024-10-11 MED ORDER — KETOROLAC TROMETHAMINE 30 MG/ML IJ SOLN
30.0000 mg | Freq: Once | INTRAMUSCULAR | Status: AC
  Administered 2024-10-11: 30 mg via INTRAMUSCULAR
  Filled 2024-10-11: qty 1

## 2024-10-11 MED ORDER — ASPIRIN 81 MG PO TBEC
81.0000 mg | DELAYED_RELEASE_TABLET | Freq: Every day | ORAL | Status: AC
  Administered 2024-10-11 – 2024-10-12 (×2): 81 mg via ORAL
  Filled 2024-10-11 (×2): qty 1

## 2024-10-11 MED ORDER — GABAPENTIN 100 MG PO CAPS
100.0000 mg | ORAL_CAPSULE | Freq: Three times a day (TID) | ORAL | Status: AC | PRN
  Administered 2024-10-12: 100 mg via ORAL
  Filled 2024-10-11: qty 1

## 2024-10-11 NOTE — ED Triage Notes (Addendum)
 Patient presents with the desire for facility placement. She complains of left leg pain due to sciatica that creates difficulty with ambulation. She attempted to seek placement at [facility], but was told she needed a few nights admission in the hospital and a PT evaluation in order to be placed.     EMS vitals 150/90 BP 80 HR 16 RR 96% SPO2 room air

## 2024-10-11 NOTE — Progress Notes (Signed)
 Awaiting PT eval.

## 2024-10-11 NOTE — ED Provider Notes (Signed)
 " [place] EMERGENCY DEPARTMENT AT [facility] Provider Note   CSN: [id number] Arrival date & time: 10/11/24  1618     Patient presents with: Leg Pain   [name] [name] is a 89 y.o. female presented to the ED with persistent left leg pain ongoing for about a week.  This began without any significant trauma about 1 week ago.  Patient was seen in the ED 1 week ago at that time due to pain radiating down her left buttock into her left leg to the ankle.  It was worse on movement.  She had x-rays as well as a DVT ultrasound with no emergent findings.  She was diagnosed with sciatica and sent home with prednisone  taper, which he has been taking regularly, as well as naproxen and Tylenol .  She said the medicine seem to help for a day or 2 but she has had increasing difficulty due to pain and limited mobility.  She is now having heaviness in her right leg and difficulty putting weight on it.  She is not able to get up or stand up or walk around the house.  She only denies any recent falls or traumas.  She is here with her son at bedside.  She reports that she was trying to get into a rehab facility but told she needs to come to the hospital to be admitted to get this done.   HPI     Prior to Admission medications  Medication Sig Start Date End Date Taking? Authorizing Provider  acetaminophen  (TYLENOL ) 500 MG tablet Take 500 mg by mouth every 6 (six) hours as needed for mild pain.    [provider]  ALPRAZolam  (XANAX ) 0.25 MG tablet Take 0.125 tablets by mouth daily as needed for anxiety.  02/04/15   [provider]  aspirin  81 MG tablet Take 81 mg by mouth daily.    [provider]  aspirin  EC 325 MG tablet Take 325 mg by mouth daily as needed.    [provider]  Besifloxacin HCl 0.6 % SUSP Apply 1 drop to eye 4 (four) times daily. For 2 days after injection    [provider]  calcium  carbonate (TUMS EX) 750 MG chewable tablet Chew 1 tablet by  mouth 2 (two) times daily as needed.     [provider]  Cholecalciferol  (VITAMIN D3) 2000 units TABS Take 1 tablet by mouth daily.    [provider]  famotidine  (PEPCID ) 20 MG tablet Take 10-20 mg by mouth at bedtime.    [provider]  faricimab -svoa (VABYSMO ) 6 MG/0.05ML SOLN intravitreal injection 6 mg by Intravitreal route every 5 (five) weeks.    [provider]  fluticasone  (FLONASE ) 50 MCG/ACT nasal spray Place 2 sprays into both nostrils daily as needed for allergies.  12/27/14   [provider]  hydrALAZINE  (APRESOLINE ) 10 MG tablet Take 1 tablet (10 mg total) by mouth in the morning and at bedtime. May take an additional dose of 10 mg Hydralazine  if blood pressure above 170 systolic or hold if blood pressure is less than 100 systolic 02/02/24   Lavona Agent, MD  lidocaine  (LIDODERM ) 5 % Place 1 patch onto the skin daily. Remove & Discard patch within 12 hours or as directed by MD 10/06/24   Veta Palma, PA-C  Magnesium  Hydroxide (PHILLIPS MILK OF MAGNESIA PO) Take 1-2 tablets by mouth 2 (two) times daily as needed.    [provider]  Multiple Vitamins-Minerals (PRESERVISION AREDS) CAPS  Take 1 capsule by mouth in the morning and at bedtime.    [provider]  predniSONE  (DELTASONE ) 10 MG tablet Take 4 tablets (40 mg total) by mouth daily with breakfast for 2 days, THEN 3 tablets (30 mg total) daily with breakfast for 2 days, THEN 2 tablets (20 mg total) daily with breakfast for 2 days, THEN 1 tablet (10 mg total) daily with breakfast for 1 day. 10/06/24 10/13/24  Veta Palma, PA-C    Allergies: Hctz [hydrochlorothiazide ], Losartan, Macrolides and ketolides, Amlodipine , Benazepril, and Penicillins    Review of Systems  Updated Vital Signs BP (!) 148/67 (BP Location: Right Arm)   Pulse 69   Temp 97.8 F (36.6 C) (Oral)   Resp (!) 24   SpO2 94%   Physical Exam Constitutional:      General: She is not in  acute distress.    Appearance: She is obese.  HENT:     Head: Normocephalic and atraumatic.  Eyes:     Conjunctiva/sclera: Conjunctivae normal.     Pupils: Pupils are equal, round, and reactive to light.  Cardiovascular:     Rate and Rhythm: Normal rate and regular rhythm.  Pulmonary:     Effort: Pulmonary effort is normal. No respiratory distress.  Abdominal:     General: There is no distension.     Tenderness: There is no abdominal tenderness.  Musculoskeletal:     Right lower leg: Edema present.     Left lower leg: Edema present.  Skin:    General: Skin is warm and dry.  Neurological:     General: No focal deficit present.     Mental Status: She is alert. Mental status is at baseline.     Comments: Positive straight leg test on the left, unable to stand up and bear weight due to pain  Psychiatric:        Mood and Affect: Mood normal.        Behavior: Behavior normal.     (all labs ordered are listed, but only abnormal results are displayed) Labs Reviewed  BASIC METABOLIC PANEL WITH GFR - Abnormal; Notable for the following components:      Result Value   Glucose, Bld 154 (*)    GFR, Estimated 56 (*)    All other components within normal limits  CBC - Abnormal; Notable for the following components:   RDW 15.9 (*)    All other components within normal limits    EKG: None  Radiology: No results found.   Procedures   Medications Ordered in the ED  ketorolac  (TORADOL ) 30 MG/ML injection 30 mg (has no administration in time range)  gabapentin  (NEURONTIN ) capsule 100 mg (has no administration in time range)  acetaminophen  (TYLENOL ) tablet 500 mg (has no administration in time range)  aspirin  EC tablet 81 mg (has no administration in time range)  famotidine  (PEPCID ) tablet 10-20 mg (has no administration in time range)                                    Medical Decision Making Amount and/or Complexity of Data Reviewed Labs: ordered.  Risk OTC  drugs. Prescription drug management.   Patient is here with persistent left-sided pain down the left buttock and to the left leg.  This is consistent again with sciatica or SI joint dysfunction given the location of her pain reproducibility and straight leg test.  I did  review her workup last week in the ER and did not see indication for further imaging at this time.  There is no trauma to suggest an occult fracture.  She has noted arthritis on her x-ray at the time.  No further falls this week.  Low suspicion for cauda equina syndrome.  I suspect that she has been compensating poorly by leaning on her right leg which is now causing pain on the right side as well.  She had a DVT study of her left extremity last week which did not show acute DVT.  I have ordered a vascular ultrasound of the right extremity as well, as she did have a hematoma of the right calf 2 months ago, to also evaluate for potential DVT.  However vascular ultrasound is not present at this time and this will be performed tomorrow.  I reviewed her basic labs which were unremarkable.  I got supplemental history from the patient's son at the bedside.  At this point she does appear to be a fall risk, with very limited mobility due to pain in her lower extremities.  This appears to be an acute change in her baseline status over the past 2 weeks.  She is not able to get by safely at home, even with support.  They are looking for rehab placement which is reasonable.  I have ordered a PT evaluation as well as placed a social work consult to help with potential placement to rehab     Final diagnoses:  None    ED Discharge Orders     None          Cottie Donnice PARAS, MD 10/11/24 2101  "

## 2024-10-12 ENCOUNTER — Emergency Department (EMERGENCY_DEPARTMENT_HOSPITAL)

## 2024-10-12 DIAGNOSIS — M79661 Pain in right lower leg: Secondary | ICD-10-CM

## 2024-10-12 MED ORDER — CALCIUM CARBONATE ANTACID 500 MG PO CHEW
1.0000 | CHEWABLE_TABLET | Freq: Once | ORAL | Status: AC
  Administered 2024-10-12: 200 mg via ORAL
  Filled 2024-10-12: qty 1

## 2024-10-12 MED ORDER — HYDRALAZINE HCL 10 MG PO TABS
10.0000 mg | ORAL_TABLET | Freq: Two times a day (BID) | ORAL | Status: AC
  Administered 2024-10-12: 10 mg via ORAL
  Filled 2024-10-12: qty 1

## 2024-10-12 MED ORDER — PRESERVISION AREDS PO CAPS: 1.0000 | ORAL_CAPSULE | Freq: Two times a day (BID) | ORAL | Status: DC

## 2024-10-12 MED ORDER — ACETAMINOPHEN 500 MG PO TABS
1000.0000 mg | ORAL_TABLET | Freq: Once | ORAL | Status: AC
  Administered 2024-10-12: 1000 mg via ORAL
  Filled 2024-10-12: qty 2

## 2024-10-12 MED ORDER — PROSIGHT PO TABS: 1.0000 | ORAL_TABLET | Freq: Every day | ORAL | Status: AC

## 2024-10-12 NOTE — Progress Notes (Signed)
 Awaiting PT Eval

## 2024-10-12 NOTE — ED Provider Notes (Signed)
 Emergency Medicine Observation Re-evaluation Note  [name] [name] is a 89 y.o. female, seen on rounds today.  Pt initially presented to the ED for complaints of Leg Pain and difficulty caring for herself at home.  Her husband is currently in a rehab facility Currently, the patient is awake and alert, speaking clearly.  Physical Exam  BP (!) 167/83 (BP Location: Left Arm)   Pulse 67   Temp 98.1 F (36.7 C) (Oral)   Resp 16   SpO2 (S) 96% Comment: room air Physical Exam General: Elderly female awake and alert Lungs: No increased work of breathing Psych: Calm, pleasantly interactive, insightful  ED Course / MDM  EKG:   I have reviewed the labs performed to date as well as medications administered while in observation.  Recent changes in the last 24 hours include none.  Plan  Current plan is for PT assessment this morning, social work conversation subsequently, with assistance for discharge planning.    Garrick Charleston, MD 10/12/24 [phone]

## 2024-10-12 NOTE — Progress Notes (Signed)
 VASCULAR LAB    Right lower extremity venous duplex has been performed.  See CV proc for preliminary results.  Gave report to Dr. Garrick LIS, [facility], RVT 10/12/2024, 11:02 AM

## 2024-10-12 NOTE — Progress Notes (Signed)
 SNF referrals faxed out offers pending

## 2024-10-12 NOTE — [place] FL2 (Signed)
 " [place]  MEDICAID FL2 LEVEL OF CARE FORM     IDENTIFICATION  Patient Name: [name] [name] Birthdate: 26-Apr-1936 Sex: female Admission Date (Current Location): 10/11/2024  [facility] and [place] Number:  Producer, Television/film/video and Address:  [facility],  [address], [place] [address]      Provider Number: [id number]  Attending Physician Name and Address:  Garrick Charleston, MD  Relative Name and Phone Number:  [place], [place], Emergency Contact  [phone]    Current Level of Care: Hospital Recommended Level of Care: Skilled Nursing Facility Prior Approval Number:    Date Approved/Denied:   PASRR Number: [id number]  Discharge Plan: SNF    Current Diagnoses: Patient Active Problem List   Diagnosis Date Noted   Cellulitis of right lower extremity 08/09/2024   Dizziness 04/28/2022   Nonrheumatic aortic valve stenosis 01/26/2021   Educated about COVID-19 virus infection 06/09/2020   Murmur 06/09/2020   CHB (complete heart block) (HCC) 06/09/2020   DDD (degenerative disc disease), lumbosacral 06/05/2019   Facet arthritis of lumbosacral region 06/05/2019   Osteopenia 06/05/2019   Fall 08/12/2015   Tibial plateau fracture 08/11/2015   Cardiac device in situ    Second degree Mobitz II AV block    Hypertension     Orientation RESPIRATION BLADDER Height & Weight     Self, Situation, Place  Normal Continent Weight:   Height:     BEHAVIORAL SYMPTOMS/MOOD NEUROLOGICAL BOWEL NUTRITION STATUS      Continent Diet (Regular)  AMBULATORY STATUS COMMUNICATION OF NEEDS Skin   Extensive Assist Verbally Normal                       Personal Care Assistance Level of Assistance  Bathing, Feeding, Dressing Bathing Assistance: Maximum assistance Feeding assistance: Limited assistance Dressing Assistance: Maximum assistance     Functional Limitations Info  Sight, Hearing, Speech Sight Info: Adequate Hearing Info: Adequate Speech Info: Adequate     SPECIAL CARE FACTORS FREQUENCY  PT (By licensed PT), OT (By licensed OT)     PT Frequency: 5X a week OT Frequency: 5X a week            Contractures Contractures Info: Not present    Additional Factors Info  Code Status, Allergies Code Status Info: full Allergies Info: Hydrochlorothiazide   Losartan  Macrolides And Ketolides  Amlodipine   Benazepril  Penicillins           Current Medications (10/12/2024):  This is the current hospital active medication list Current Facility-Administered Medications  Medication Dose Route Frequency Provider Last Rate Last Admin   acetaminophen  (TYLENOL ) tablet 500 mg  500 mg Oral Q6H PRN Cottie Donnice PARAS, MD       aspirin  EC tablet 81 mg  81 mg Oral Daily Trifan, Matthew J, MD   81 mg at 10/12/24 0915   famotidine  (PEPCID ) tablet 10-20 mg  10-20 mg Oral QHS Trifan, Matthew J, MD   20 mg at 10/11/24 2126   gabapentin  (NEURONTIN ) capsule 100 mg  100 mg Oral TID PRN Cottie Donnice PARAS, MD       Current Outpatient Medications  Medication Sig Dispense Refill   acetaminophen  (TYLENOL ) 500 MG tablet Take 1,000 mg by mouth every 6 (six) hours as needed for mild pain (pain score 1-3) or moderate pain (pain score 4-6).     ALPRAZolam  (XANAX ) 0.25 MG tablet Take 0.25 mg by mouth daily as needed for anxiety.     aspirin  81  MG tablet Take 81 mg by mouth daily.     Besifloxacin HCl 0.6 % SUSP Apply 1 drop to eye 4 (four) times daily. For 2 days after injection     calcium  carbonate (TUMS EX) 750 MG chewable tablet Chew 750 mg by mouth at bedtime.     Cholecalciferol  (VITAMIN D3) 2000 units TABS Take 2,000 Units by mouth daily.     famotidine  (PEPCID ) 20 MG tablet Take 20 mg by mouth at bedtime.     faricimab -svoa (VABYSMO ) 6 MG/0.05ML SOLN intravitreal injection 6 mg by Intravitreal route every 5 (five) weeks.     fluticasone  (FLONASE ) 50 MCG/ACT nasal spray Place 2 sprays into both nostrils daily as needed for allergies.      hydrALAZINE  (APRESOLINE ) 10  MG tablet Take 1 tablet (10 mg total) by mouth in the morning and at bedtime. May take an additional dose of 10 mg Hydralazine  if blood pressure above 170 systolic or hold if blood pressure is less than 100 systolic 270 tablet 3   lidocaine  (LIDODERM ) 5 % Place 1 patch onto the skin daily. Remove & Discard patch within 12 hours or as directed by MD (Patient taking differently: Place 0.5 patches onto the skin daily. Remove & Discard patch within 12 hours or as directed by MD) 30 patch 0   Magnesium  Hydroxide (PHILLIPS MILK OF MAGNESIA PO) Take 1 capsule by mouth at bedtime.     Multiple Vitamins-Minerals (PRESERVISION AREDS) CAPS Take 1 capsule by mouth in the morning and at bedtime.     naproxen sodium (ALEVE) 220 MG tablet Take 440 mg by mouth 2 (two) times daily as needed (pain).     predniSONE  (DELTASONE ) 10 MG tablet Take 4 tablets (40 mg total) by mouth daily with breakfast for 2 days, THEN 3 tablets (30 mg total) daily with breakfast for 2 days, THEN 2 tablets (20 mg total) daily with breakfast for 2 days, THEN 1 tablet (10 mg total) daily with breakfast for 1 day. 19 tablet 0     Discharge Medications: Please see discharge summary for a list of discharge medications.  Relevant Imaging Results:  Relevant Lab Results:   Additional Information SSN [id number]  Lucie Moats, LCSW     "

## 2024-10-12 NOTE — Evaluation (Addendum)
 Physical Therapy Evaluation Patient Details Name: [name] [name] MRN: [id number] DOB: March 30, 1936 Today's Date: 10/12/2024  History of Present Illness  89 y.o. female presented to the ED with persistent left leg pain ongoing for about a week.  This began without any significant trauma about 1 week ago.  Patient was seen in the ED 1 week ago at that time due to pain radiating down her left buttock into her left leg to the ankle.  It was worse on movement.  She had x-rays as well as a DVT ultrasound with no emergent findings.  She was diagnosed with sciatica and sent home with prednisone  taper, which he has been taking regularly, as well as naproxen and Tylenol .  She said the medicine seem to help for a day or 2 but she has had increasing difficulty due to pain and limited mobility.  She is now having heaviness in her right leg and difficulty putting weight on it.  Clinical Impression  Pt admitted with above diagnosis. Pt reports significant decline in mobility in the past week. At baseline she ambulates with a cane but prior to admission was not able to walk with a rollator. Today pt required mod assist for supine to sit, min assist for sit to stand. Pt was able to take 3 side steps to the R, distance limited by L groin pain and fatigue. Pt did report some lightheadedness with supine to sit and sit to stand, she wasn't able to stand long enough to obtain orthostatics. Recommend assessment of orthostatic at a future time. Patient will benefit from continued inpatient follow up therapy, <3 hours/day  Pt currently with functional limitations due to the deficits listed below (see PT Problem List). Pt will benefit from acute skilled PT to increase their independence and safety with mobility to allow discharge.           If plan is discharge home, recommend the following: A lot of help with walking and/or transfers;A lot of help with bathing/dressing/bathroom;Assistance with cooking/housework;Assist for  transportation;Help with stairs or ramp for entrance   Can travel by private vehicle   No    Equipment Recommendations None recommended by PT  Recommendations for Other Services       Functional Status Assessment Patient has had a recent decline in their functional status and demonstrates the ability to make significant improvements in function in a reasonable and predictable amount of time.     Precautions / Restrictions Precautions Precautions: Fall Recall of Precautions/Restrictions: Intact Precaution/Restrictions Comments: 1 fall in November 2025 Restrictions Weight Bearing Restrictions Per Provider Order: No      Mobility  Bed Mobility Overal bed mobility: Needs Assistance Bed Mobility: Supine to Sit     Supine to sit: Mod assist     General bed mobility comments: assist to advance LLE and to raise trunk/pivot hips    Transfers Overall transfer level: Needs assistance Equipment used: Rolling walker (2 wheels) Transfers: Sit to/from Stand Sit to Stand: Min assist, From elevated surface           General transfer comment: assist to power up    Ambulation/Gait Ambulation/Gait assistance: Contact guard assist Gait Distance (Feet): 2 Feet Assistive device: Rolling walker (2 wheels) Gait Pattern/deviations: Step-to pattern, Decreased step length - right, Step-through pattern Gait velocity: decr     General Gait Details: pt took 3 small side steps to the R  with RW, distance limited by fatigue  Stairs  Wheelchair Mobility     Tilt Bed    Modified Rankin (Stroke Patients Only)       Balance Overall balance assessment: Needs assistance, History of Falls Sitting-balance support: Feet supported, No upper extremity supported Sitting balance-Leahy Scale: Fair     Standing balance support: Bilateral upper extremity supported, During functional activity, Reliant on assistive device for balance Standing balance-Leahy Scale: Poor                                Pertinent Vitals/Pain Pain Assessment Pain Assessment: 0-10 Pain Score: 7  Pain Location: L groin Pain Descriptors / Indicators: Sore Pain Intervention(s): Limited activity within patient's tolerance, Monitored during session, Premedicated before session    Home Living Family/patient expects to be discharged to:: Private residence Living Arrangements: Spouse/significant other Available Help at Discharge: Family;Available 24 hours/day Type of Home: House Home Access: Stairs to enter Entrance Stairs-Rails: Right;Left Entrance Stairs-Number of Steps: 3 Alternate Level Stairs-Number of Steps: stair lift but pt reports she climbs the stairs Home Layout: Two level;Full bath on main level;Able to live on main level with bedroom/bathroom Home Equipment: Shower seat - built in;Cane - single point;Cane - [employer] (2 wheels);Rollator (4 wheels) Additional Comments: spouse is in SNF 2* clavicle fx    Prior Function               Mobility Comments: was using rollator or 2 canes, walking has become very difficult in last few days ADLs Comments: pt reports ind with ADLs/IADLs, has cleaning service for house chores     Extremity/Trunk Assessment   Upper Extremity Assessment Upper Extremity Assessment: Overall WFL for tasks assessed    Lower Extremity Assessment Lower Extremity Assessment: RLE deficits/detail;LLE deficits/detail RLE Deficits / Details: knee ext +4/5 RLE Sensation: WNL LLE Deficits / Details: knee ext 3/5, hip flexion 2/5 limited by pain LLE Sensation: WNL    Cervical / Trunk Assessment Cervical / Trunk Assessment: Kyphotic  Communication   Communication Communication: No apparent difficulties    Cognition Arousal: Alert Behavior During Therapy: WFL for tasks assessed/performed   PT - Cognitive impairments: No apparent impairments                         Following commands: Intact       Cueing        General Comments      Exercises     Assessment/Plan    PT Assessment Patient needs continued PT services  PT Problem List Decreased activity tolerance;Pain;Decreased mobility;Decreased balance       PT Treatment Interventions Functional mobility training;Therapeutic exercise;Gait training;DME instruction;Therapeutic activities;Patient/family education;Balance training    PT Goals (Current goals can be found in the Care Plan section)  Acute Rehab PT Goals Patient Stated Goal: to go to rehab PT Goal Formulation: With patient Time For Goal Achievement: 10/26/24 Potential to Achieve Goals: Good    Frequency Min 2X/week     Co-evaluation               AM-PAC PT 6 Clicks Mobility  Outcome Measure Help needed turning from your back to your side while in a flat bed without using bedrails?: A Little Help needed moving from lying on your back to sitting on the side of a flat bed without using bedrails?: A Lot Help needed moving to and from a bed to a chair (including a wheelchair)?: A Lot Help  needed standing up from a chair using your arms (e.g., wheelchair or bedside chair)?: A Lot Help needed to walk in hospital room?: A Lot Help needed climbing 3-5 steps with a railing? : Total 6 Click Score: 12    End of Session Equipment Utilized During Treatment: Gait belt Activity Tolerance: Patient limited by pain;Patient limited by fatigue Patient left: in bed;with call bell/phone within reach Nurse Communication: Mobility status PT Visit Diagnosis: Difficulty in walking, not elsewhere classified (R26.2);Pain Pain - Right/Left: Left Pain - part of body: Hip    Time: 8861-8840 PT Time Calculation (min) (ACUTE ONLY): 21 min   Charges:   PT Evaluation $PT Eval Moderate Complexity: 1 Mod   PT General Charges $$ ACUTE PT VISIT: 1 Visit         [name] PT 10/12/2024  Acute Rehabilitation Services  Office [phone]

## 2024-10-12 NOTE — Progress Notes (Signed)
 CSW spoke with Randine, admission coordinator with [facility]; Pt was offered bed.The pt can admit tomorrow with approved auth. Shara has been initiated and is currently pending.ICM to follow.   Tawni HERO.Olaoluwa Grieder, MSW, LCSW [facility] Management  Clinical Social Worker  Direct Dial: [phone]  Fax: [phone] [email or web address]

## 2024-10-12 NOTE — Progress Notes (Signed)
 PT Cancellation Note  Patient Details Name: [name] [name] MRN: [id number] DOB: 03/11/1936   Cancelled Treatment:    Reason Eval/Treat Not Completed: Other (comment) (dopplers to r/o DVT pending. Will follow.)   [name] PT 10/12/2024  [facility]  Office [phone]

## 2024-10-22 ENCOUNTER — Encounter (INDEPENDENT_AMBULATORY_CARE_PROVIDER_SITE_OTHER): Admitting: Ophthalmology

## 2024-10-23 ENCOUNTER — Encounter (INDEPENDENT_AMBULATORY_CARE_PROVIDER_SITE_OTHER): Admitting: Ophthalmology

## 2024-11-19 ENCOUNTER — Ambulatory Visit: Admitting: Podiatry
